# Patient Record
Sex: Female | Born: 1960
Health system: Southern US, Community
[De-identification: ages and names within clinical notes are randomized; demographics above are authoritative.]

## PROBLEM LIST (undated history)

## (undated) DIAGNOSIS — M7501 Adhesive capsulitis of right shoulder: Secondary | ICD-10-CM

## (undated) DIAGNOSIS — K869 Disease of pancreas, unspecified: Secondary | ICD-10-CM

## (undated) DIAGNOSIS — M109 Gout, unspecified: Secondary | ICD-10-CM

## (undated) DIAGNOSIS — T8859XA Other complications of anesthesia, initial encounter: Secondary | ICD-10-CM

## (undated) DIAGNOSIS — C642 Malignant neoplasm of left kidney, except renal pelvis: Secondary | ICD-10-CM

## (undated) DIAGNOSIS — S43431A Superior glenoid labrum lesion of right shoulder, initial encounter: Secondary | ICD-10-CM

## (undated) DIAGNOSIS — F32A Depression, unspecified: Secondary | ICD-10-CM

## (undated) DIAGNOSIS — F329 Major depressive disorder, single episode, unspecified: Secondary | ICD-10-CM

## (undated) DIAGNOSIS — G8929 Other chronic pain: Secondary | ICD-10-CM

## (undated) DIAGNOSIS — H04129 Dry eye syndrome of unspecified lacrimal gland: Secondary | ICD-10-CM

## (undated) DIAGNOSIS — R011 Cardiac murmur, unspecified: Secondary | ICD-10-CM

## (undated) DIAGNOSIS — G2581 Restless legs syndrome: Secondary | ICD-10-CM

## (undated) DIAGNOSIS — K219 Gastro-esophageal reflux disease without esophagitis: Secondary | ICD-10-CM

## (undated) DIAGNOSIS — F909 Attention-deficit hyperactivity disorder, unspecified type: Secondary | ICD-10-CM

## (undated) DIAGNOSIS — R0789 Other chest pain: Secondary | ICD-10-CM

## (undated) DIAGNOSIS — R911 Solitary pulmonary nodule: Secondary | ICD-10-CM

## (undated) DIAGNOSIS — I2510 Atherosclerotic heart disease of native coronary artery without angina pectoris: Secondary | ICD-10-CM

## (undated) DIAGNOSIS — S92253A Displaced fracture of navicular [scaphoid] of unspecified foot, initial encounter for closed fracture: Secondary | ICD-10-CM

## (undated) DIAGNOSIS — M549 Dorsalgia, unspecified: Secondary | ICD-10-CM

## (undated) DIAGNOSIS — I519 Heart disease, unspecified: Secondary | ICD-10-CM

## (undated) DIAGNOSIS — I700 Atherosclerosis of aorta: Secondary | ICD-10-CM

## (undated) DIAGNOSIS — N200 Calculus of kidney: Secondary | ICD-10-CM

## (undated) DIAGNOSIS — F419 Anxiety disorder, unspecified: Secondary | ICD-10-CM

## (undated) DIAGNOSIS — Z1211 Encounter for screening for malignant neoplasm of colon: Secondary | ICD-10-CM

## (undated) HISTORY — DX: Malignant neoplasm of left kidney, except renal pelvis: C64.2

## (undated) HISTORY — DX: Atherosclerosis of aorta: I70.0

## (undated) HISTORY — DX: Displaced fracture of navicular (scaphoid) of unspecified foot, initial encounter for closed fracture: S92.253A

## (undated) HISTORY — DX: Other chronic pain: G89.29

## (undated) HISTORY — DX: Disease of pancreas, unspecified: K86.9

## (undated) HISTORY — DX: Adhesive capsulitis of right shoulder: M75.01

## (undated) HISTORY — DX: Anxiety disorder, unspecified: F41.9

## (undated) HISTORY — DX: Dry eye syndrome of unspecified lacrimal gland: H04.129

## (undated) HISTORY — DX: Gout, unspecified: M10.9

## (undated) HISTORY — DX: Depression, unspecified: F32.A

## (undated) HISTORY — PX: WISDOM TOOTH EXTRACTION: SHX21

## (undated) HISTORY — DX: Other chest pain: R07.89

## (undated) HISTORY — DX: Attention-deficit hyperactivity disorder, unspecified type: F90.9

## (undated) HISTORY — DX: Solitary pulmonary nodule: R91.1

## (undated) HISTORY — DX: Dorsalgia, unspecified: M54.9

## (undated) HISTORY — DX: Encounter for screening for malignant neoplasm of colon: Z12.11

## (undated) HISTORY — DX: Major depressive disorder, single episode, unspecified: F32.9

## (undated) HISTORY — DX: Calculus of kidney: N20.0

## (undated) HISTORY — DX: Atherosclerotic heart disease of native coronary artery without angina pectoris: I25.10

## (undated) HISTORY — DX: Superior glenoid labrum lesion of right shoulder, initial encounter: S43.431A

## (undated) HISTORY — DX: Restless legs syndrome: G25.81

## (undated) HISTORY — PX: REDUCTION MAMMAPLASTY: SUR839

---
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
## 1997-01-16 HISTORY — PX: BREAST SURGERY: SHX581

## 1998-02-24 ENCOUNTER — Other Ambulatory Visit: Admission: RE | Admit: 1998-02-24 | Discharge: 1998-02-24 | Payer: Self-pay | Admitting: Obstetrics and Gynecology

## 1998-05-04 ENCOUNTER — Other Ambulatory Visit: Admission: RE | Admit: 1998-05-04 | Discharge: 1998-05-04 | Payer: Self-pay | Admitting: Plastic Surgery

## 1998-06-16 ENCOUNTER — Ambulatory Visit (HOSPITAL_COMMUNITY): Admission: RE | Admit: 1998-06-16 | Discharge: 1998-06-16 | Payer: Self-pay | Admitting: Neurosurgery

## 1998-06-16 ENCOUNTER — Encounter: Payer: Self-pay | Admitting: Neurosurgery

## 1999-07-27 ENCOUNTER — Other Ambulatory Visit: Admission: RE | Admit: 1999-07-27 | Discharge: 1999-07-27 | Payer: Self-pay | Admitting: Obstetrics and Gynecology

## 2000-01-17 HISTORY — PX: ENDOMETRIAL ABLATION: SHX621

## 2001-02-27 ENCOUNTER — Other Ambulatory Visit: Admission: RE | Admit: 2001-02-27 | Discharge: 2001-02-27 | Payer: Self-pay | Admitting: Obstetrics and Gynecology

## 2002-05-29 ENCOUNTER — Other Ambulatory Visit: Admission: RE | Admit: 2002-05-29 | Discharge: 2002-05-29 | Payer: Self-pay | Admitting: Obstetrics and Gynecology

## 2003-01-17 HISTORY — PX: KIDNEY STONE SURGERY: SHX686

## 2003-07-31 ENCOUNTER — Other Ambulatory Visit: Admission: RE | Admit: 2003-07-31 | Discharge: 2003-07-31 | Payer: Self-pay | Admitting: Obstetrics and Gynecology

## 2004-06-23 ENCOUNTER — Ambulatory Visit (HOSPITAL_COMMUNITY): Admission: AD | Admit: 2004-06-23 | Discharge: 2004-06-23 | Payer: Self-pay | Admitting: Urology

## 2004-10-17 ENCOUNTER — Other Ambulatory Visit: Admission: RE | Admit: 2004-10-17 | Discharge: 2004-10-17 | Payer: Self-pay | Admitting: Dermatology

## 2005-04-10 ENCOUNTER — Other Ambulatory Visit: Admission: RE | Admit: 2005-04-10 | Discharge: 2005-04-10 | Payer: Self-pay | Admitting: Obstetrics and Gynecology

## 2005-06-30 ENCOUNTER — Ambulatory Visit (HOSPITAL_COMMUNITY): Admission: RE | Admit: 2005-06-30 | Discharge: 2005-06-30 | Payer: Self-pay | Admitting: Obstetrics and Gynecology

## 2005-06-30 ENCOUNTER — Encounter (INDEPENDENT_AMBULATORY_CARE_PROVIDER_SITE_OTHER): Payer: Self-pay | Admitting: Specialist

## 2007-12-02 ENCOUNTER — Encounter (INDEPENDENT_AMBULATORY_CARE_PROVIDER_SITE_OTHER): Payer: Self-pay | Admitting: Obstetrics and Gynecology

## 2007-12-02 ENCOUNTER — Ambulatory Visit (HOSPITAL_COMMUNITY): Admission: RE | Admit: 2007-12-02 | Discharge: 2007-12-03 | Payer: Self-pay | Admitting: Obstetrics and Gynecology

## 2007-12-02 HISTORY — PX: LAPAROSCOPIC VAGINAL HYSTERECTOMY: SUR798

## 2008-06-10 ENCOUNTER — Encounter: Admission: RE | Admit: 2008-06-10 | Discharge: 2008-06-10 | Payer: Self-pay | Admitting: Family Medicine

## 2008-10-07 ENCOUNTER — Encounter: Admission: RE | Admit: 2008-10-07 | Discharge: 2008-10-07 | Payer: Self-pay | Admitting: Obstetrics and Gynecology

## 2010-02-04 ENCOUNTER — Encounter
Admission: RE | Admit: 2010-02-04 | Discharge: 2010-02-04 | Payer: Self-pay | Source: Home / Self Care | Attending: Obstetrics and Gynecology | Admitting: Obstetrics and Gynecology

## 2010-02-05 ENCOUNTER — Encounter: Payer: Self-pay | Admitting: Obstetrics and Gynecology

## 2010-02-06 ENCOUNTER — Encounter: Payer: Self-pay | Admitting: Family Medicine

## 2010-05-31 NOTE — Discharge Summary (Signed)
NAMEABBYGAIL, Jacqueline Vance                ACCOUNT NO.:  [id number]   MEDICAL RECORD NO.:  [id number]          PATIENT TYPE:  OIB   LOCATION:  9309                          FACILITY:  [facility]   PHYSICIAN:  Juluis Mire, M.D.   DATE OF BIRTH:  07/17/1960   DATE OF ADMISSION:  12/02/2007  DATE OF DISCHARGE:  12/03/2007                               DISCHARGE SUMMARY   ADMITTING DIAGNOSIS:  Uterine adenomyosis.   POSTOPERATIVE DIAGNOSIS:  Uterine adenomyosis.   OPERATIVE PROCEDURE:  Laparoscopic-assisted vaginal hysterectomy.   For complete history and physical, please see dictated note course in  the hospital.  The patient underwent laparoscopic-assisted vaginal  hysterectomy.  Did have some evidence of persistent endometriosis.  She  did well postoperatively, discharged home on the first postop day.  Postop hemoglobin was 11.  At the time of discharge, she was afebrile,  stable vital signs.  The abdomen was soft.  Bowel sounds were active.  All incisions were clear.  She was voiding without difficulty, having no  active vaginal bleeding.   In terms of complications, none were encountered in the hospital, the  patient was discharged home in stable condition.   DISPOSITION:  Routine postop instructions were given.  She is to avoid  heavy lifting, vaginal entrance, or driving a car.  She is to watch for  signs of infection, nausea, vomiting, increased abdominal pain, or  active vaginal bleeding.  Signs and symptoms of deep venous thrombosis  and pulmonary embolus also discussed.  She will follow up in the office  in 1 week.      Juluis Mire, M.D.  Electronically Signed     JSM/MEDQ  D:  12/03/2007  T:  12/03/2007  Job:  [id number]

## 2010-05-31 NOTE — Op Note (Signed)
NAMEKALKIDAN, CAUDELL                ACCOUNT NO.:  [id number]   MEDICAL RECORD NO.:  [id number]          PATIENT TYPE:  OIB   LOCATION:  9309                          [facility]   PHYSICIAN:  Juluis Mire, M.D.   DATE OF BIRTH:  1960/10/31   DATE OF PROCEDURE:  12/02/2007  DATE OF DISCHARGE:                               OPERATIVE REPORT   PREOPERATIVE DIAGNOSIS:  Adenomyosis.   POSTOPERATIVE DIAGNOSES:  1. Adenomyosis.  2. Endometriosis.   PROCEDURE:  Laparoscopic-assisted vaginal hysterectomy.   SURGEON:  Juluis Mire, MD   ASSISTANT:  Stann Mainland. Grewal, MD   ANESTHESIA:  General endotracheal.   ESTIMATED BLOOD LOSS:  Approximately 200-300 mL.   PACKS AND DRAINS:  None.   INTRAOPERATIVE BLOOD REPLACEMENT:  None.   COMPLICATIONS:  None.   INDICATIONS:  As noted in history and physical.   PROCEDURE:  The patient was taken to the OR and placed in supine  position.  After satisfactory level of general endotracheal anesthesia  was obtained, the patient was placed in the dorsal lithotomy position  using the Allen stirrups.  At this point in time, the abdomen, perineum,  and vagina were prepped out with Betadine.  Bladder was emptied by  catheterization.  A Hulka tenaculum was put in place and secured.  The  patient was then draped in a sterile field.  At this point in time, a  subumbilical incision was made with a knife.  The Veress needle was  introduced into the abdominal cavity.  Abdomen was inflated with  approximately 3 L of carbon dioxide.  The operating laparoscope was then  introduced.  There was no evidence of injury to adjacent organs.  A 5-mm  trocar was put in place in the suprapubic area under direct  visualization.  Uterus, tubes, and ovaries were unremarkable.  She did  have some right pelvic wall endometriosis.  Upper abdomen including  liver and tip of the gallbladder were clear.  Both the lateral gutters  were clear, I could not see the appendix,  maybe retrocecal.  At this  point in time, we went to the right adnexa using the EndoSeal, we  cauterized and ligated the right utero-ovarian pedicle along with the  right round ligament.  We then went to the left side using the EndoSeal,  we cauterized and incised the left utero-ovarian pedicles as well as the  left round ligament.  We had good freeing of the adnexa.  Decision was  to go vaginally.   The abdomen was deflated with carbon dioxide.  The laparoscope had been  taken out.  The patient's legs were repositioned.  The Hulka tenaculum  was then removed.  A weighted speculum was placed in the vaginal vault.  Cervix was grasped with Christella Hartigan tenaculum.  Cul-de-sac was entered  sharply.  Both uterosacral ligaments were clamped, cut, and suture  ligated with 0 Vicryl.  Reflection of the vaginal mucosa anteriorly was  incised, and bladder was dissected superiorly.  Paracervical tissue was  clamped, cut, and suture ligated with 0 Vicryl.  Vesicouterine space was  entered, and retractors were put in place to retract the bladder  superiorly using the clamp, cut, and tie technique with suture ligature  of 0 Vicryl.  The parametrium was serially separated from sides of  uterus.  The uterus was then flipped, remaining pedicles were clamped  and cut.  Uterus and cervix were passed off the operative field and sent  to Pathology.  Held pedicles secured with free ties of 0 Vicryl.  A  uterosacral plication stitch of 0 chromic was put in place and secured.  Next, the vaginal mucosa was reapproximated with interrupted sutures of  2-0 Monocryl using figure-of-eight sutures.  We had good approximation.  A Foley was placed to straight drain, and clear urine was obtained.   The patient's legs were repositioned.  Laparoscope was reintroduced.  Visualization did reveal some oozing from the vaginal cuff and brought  under control using cautery.  At the end of the procedure, both ovaries  were  hemostatically intact along with the vaginal cuff.  We thoroughly  irrigated, and removing irrigation, had good hemostasis.  The abdomen  was deflated with carbon dioxide.  All trocars removed.  The  subumbilical incision was closed with interrupted subcuticular suture of  4-0 Vicryl.  Suprapubic incision was closed with Dermabond.  The patient  was taken out of dorsal lithotomy position.  Once alert and extubated,  transferred to recovery room in good condition.  Sponge, instrument, and  needle count was correct by circulating nurse x2.      Juluis Mire, M.D.  Electronically Signed     JSM/MEDQ  D:  12/02/2007  T:  12/02/2007  Job:  [id number]

## 2010-05-31 NOTE — H&P (Signed)
Jacqueline Vance, Jacqueline Vance                ACCOUNT NO.:  [id number]   MEDICAL RECORD NO.:  [id number]          PATIENT TYPE:  AMB   LOCATION:  SDC                           [facility]ILITY:  [facility]   PHYSICIAN:  Juluis Mire, M.D.   DATE OF BIRTH:  1960-04-13   DATE OF ADMISSION:  DATE OF DISCHARGE:                              HISTORY & PHYSICAL   The patient is a 47-year gravida 1, para 1 female presents for  laparoscopic-assisted vaginal hysterectomy.   In relation to present admission, the patient had a previous  ThermaChoice ablation back in 2002, was doing well.  However, from the  past year she is having increasing pelvic pain and dysmenorrhea.  This  seems to be cyclic, we felt that probably related adenomyosis.  Ultrasound evaluations have revealed a questionable bicornate uterus.  No adnexal masses were seen.  Endometrial thickness was measured on the  largest 1-5.6 mm.  In view of this, the patient had discussed options  including hormonal treatment versus hysterectomy.  The patient now  presents for laparoscopic-assisted vaginal hysterectomy.   In terms of allergies.  She has no known drug allergies.   MEDICATIONS:  1. Celexa 20 mg a day.  2. Wellbutrin.  3. Imitrex as needed.   PAST MEDICAL HISTORY:  1. Usual childhood diseases, no significant sequelae.  2. She does have a history of migraine headaches being actively      managed.  3. Had a previous laser conization of cervix for dysplasia.  4. From surgical standpoint, she had a breast reduction in 1999.  5. She had kidney stone surgery in 2005.  6. She had endometrial ablation as noted above.   OBSTETRICAL HISTORY:  She had one vaginal delivery.   FAMILY HISTORY:  There is a history of diverticulosis, diabetes, and  arthritis.   SOCIAL HISTORY:  No tobacco or alcohol use.   REVIEW OF SYSTEMS:  Noncontributory.   PHYSICAL EXAMINATION:  VITAL SIGNS:  The patient is afebrile and stable  vital signs.  HEENT:  The patient  is normocephalic.  Pupils equal, round, reactive to  light, and accommodation.  Extraocular movements are intact.  Sclerae  and conjunctivae are clear.  Oropharynx clear.  NECK:  Without thyromegaly.  BREASTS:  No discrete masses.  LUNGS:  Clear.  CARDIOVASCULAR SYSTEM:  Regular rate without murmurs or gallops.  ABDOMEN:  Benign.  No mass, organomegaly, or tenderness.  PELVIC:  Normal external genitalia.  Vaginal mucosa is clear.  Cervix  unremarkable.  Uterus upper limits of normal size, mildly tender.  Adnexa unremarkable.  EXTREMITIES:  Trace edema.  NEUROLOGIC:  Grossly within normal limits.   IMPRESSION:  Recurrent pelvic pain, discomfort probably secondary to  adenomyosis.   PLAN:  After discussion of options, the patient to undergo laparoscopic-  assisted vaginal hysterectomy.  Risks of surgery have been discussed  including the risk of infection.  The risk of hemorrhage could require a  transfusion with the risk of AIDS or hepatitis.  The risk of injury to  adjacent organs including bladder, bowel, or ureters that could require  further  exploratory surgery.  The risk of deep venous thrombosis and  pulmonary embolus.  The patient expressed understanding of indications  and risks.      Juluis Mire, M.D.  Electronically Signed     JSM/MEDQ  D:  12/02/2007  T:  12/02/2007  Job:  [id number]

## 2010-06-03 NOTE — H&P (Signed)
Jacqueline Vance, Jacqueline Vance                ACCOUNT NO.:  [id number]   MEDICAL RECORD NO.:  [id number]          PATIENT TYPE:  AMB   LOCATION:  SDC                           [facility]   PHYSICIAN:  Juluis Mire, M.D.   DATE OF BIRTH:  October 03, 1960   DATE OF ADMISSION:  06/30/2005  DATE OF DISCHARGE:                                HISTORY & PHYSICAL   The patient is a 50 year old gravida 1, para 1, female who presents for  hysteroscopy and ThermaChoice ablation.   In relation to the present admission, she had been having trouble with  menorrhagia and prolonged cycles.  Cycles have been approximately 21 days of  flow.  She describes heavy flow, using 2 tampons and changing these every  hour, with clots and dysmenorrhea.  Her previous saline infusion was  unremarkable.  We thought she had a probable adenomyosis.  We have discussed  options including birth control pills versus Mirena IUD versus ablation  versus hysterectomy.  The patient now presents for ThermaChoice ablation.  Overall satisfaction with ablation runs between 80 and 90%, amenorrheic  rates of approximately 40% are quoted.   ALLERGIES:  She has no known drug allergies.   MEDICATIONS:  She is on Topamax and Celexa.   PAST MEDICAL HISTORY:  The usual childhood diseases, no significant  sequelae.  She has had no previous surgical history, has had a laser  conization of the cervix for dysplasia.  She has had 1 vaginal delivery.   FAMILY HISTORY:  Noncontributory.   SOCIAL HISTORY:  No tobacco or alcohol use.   REVIEW OF SYSTEMS:  Noncontributory.   PHYSICAL EXAMINATION:  VITAL SIGNS:  The patient is afebrile with stable  vital signs.  HEENT:  Patient normocephalic.  Pupils equal, round and reactive to light  and accommodation.  Extraocular movements are intact.  Sclerae and  conjunctivae are clear.  NECK:  Without thyromegaly.  BREASTS:  No discrete masses.  LUNGS:  Clear.  CARDIAC:  Regular rhythm and rate without  murmurs or gallops.  ABDOMEN:  Benign.  No mass, organomegaly or tenderness.  PELVIC:  Normal external genitalia.  Vaginal mucosa clear.  Cervix  unremarkable.  Uterus upper limits of normal size.  Adnexa unremarkable.  EXTREMITIES:  Trace edema.  NEUROLOGIC:  Grossly within normal limits.   IMPRESSION:  Menorrhagia, probably secondary to adenomyosis.   PLAN:  The patient will undergo a hysteroscopy, D&C and ThermaChoice  ablation.  The risks of the procedure have been discussed, including the  risk of infection; the risk of hemorrhage that could require transfusion or  possible  hysterectomy; the risk of injury to adjacent organs through perforation that  could require further exploratory surgery; the risk of deep venous  thrombosis and pulmonary embolus.  The patient expressed an understanding of  indications and risks.      Juluis Mire, M.D.  Electronically Signed     JSM/MEDQ  D:  06/30/2005  T:  06/30/2005  Job:  [id number]

## 2010-06-03 NOTE — Op Note (Signed)
NAMECAMELLIA, Jacqueline Vance                ACCOUNT NO.:  [id number]   MEDICAL RECORD NO.:  [id number]          PATIENT TYPE:  AMB   LOCATION:  SDC                           [facility]   PHYSICIAN:  Juluis Mire, M.D.   DATE OF BIRTH:  18-Mar-1960   DATE OF PROCEDURE:  06/30/2005  DATE OF DISCHARGE:                                 OPERATIVE REPORT   PREOPERATIVE DIAGNOSIS:  Menorrhagia secondary to adenomyosis.   POSTOPERATIVE DIAGNOSIS:  Menorrhagia secondary to adenomyosis.   OPERATIVE PROCEDURE:  1.  Cervical dilatation.  2.  Hysteroscopy with endometrial curettings.  3.  ThermaChoice ablation.   SURGEON:  Juluis Mire, M.D.   ANESTHESIA:  General.   ESTIMATED BLOOD LOSS:  Minimal.   PACK AND DRAINS:  None.   INTRAOPERATIVE BLOOD REPLACEMENT:  None.   COMPLICATIONS:  None.   INDICATIONS:  Indications are in dictated history and physical.   DESCRIPTION OF PROCEDURE:  The patient was taken to the OR and placed in a  supine position.  After a satisfactory level of general anesthesia was  obtained, the patient was placed in the dorsal lithotomy position using the  Allen stirrups.  The perineum and vagina were prepped out with Betadine and  draped into a sterile field.  A speculum was placed in the vaginal vault.  Cervix was grasped with a single-tooth tenaculum and a paracervical block  was instituted using 1% Nesacaine.  The uterus sounded to 8 cm.  We dilated  the cervix to a 26 Pratt dilator.  A nonoperative hysteroscope was  introduced and the intrauterine cavity was distended with D-5-W.  Visualization revealed normal endometrial contour, no polyps or other  abnormalities.  We did do endometrial curettings and these were sent for  pathological review.  Next, the ThermaChoice was appropriately primed and  inserted into the uterine cavity.  We began distending using D-5-W.  We  reached and stabilized with a pressure of approximately 190.  The cycle was  then begun and  completed within 8 minutes.  Pressure continued to be within  the normal range.  At this point, the cooling cycle instilled.  Once  completed, we deflated the balloon and removed the instrument intact.  Repeat hysteroscopy revealed excellent blanching of the endometrium and no  signs of perforation or other  complications.  The single-tooth tenaculum and speculum were then removed.  The patient was taken out of the dorsal lithotomy position and once she was  alert and extubated, transferred to the recovery room in good condition.  Sponge, instrument and needle count were reported as correct by the  circulating nurse.      Juluis Mire, M.D.  Electronically Signed     JSM/MEDQ  D:  06/30/2005  T:  06/30/2005  Job:  [id number]

## 2010-08-10 ENCOUNTER — Ambulatory Visit (INDEPENDENT_AMBULATORY_CARE_PROVIDER_SITE_OTHER): Payer: 59 | Admitting: Family Medicine

## 2010-08-10 ENCOUNTER — Encounter: Payer: Self-pay | Admitting: Family Medicine

## 2010-08-10 ENCOUNTER — Encounter: Payer: Self-pay | Admitting: *Deleted

## 2010-08-10 ENCOUNTER — Telehealth: Payer: Self-pay | Admitting: Family Medicine

## 2010-08-10 DIAGNOSIS — Z23 Encounter for immunization: Secondary | ICD-10-CM

## 2010-08-10 DIAGNOSIS — Z0000 Encounter for general adult medical examination without abnormal findings: Secondary | ICD-10-CM

## 2010-08-10 DIAGNOSIS — G8929 Other chronic pain: Secondary | ICD-10-CM

## 2010-08-10 DIAGNOSIS — M549 Dorsalgia, unspecified: Secondary | ICD-10-CM

## 2010-08-10 DIAGNOSIS — F329 Major depressive disorder, single episode, unspecified: Secondary | ICD-10-CM | POA: Insufficient documentation

## 2010-08-10 DIAGNOSIS — Z8669 Personal history of other diseases of the nervous system and sense organs: Secondary | ICD-10-CM | POA: Insufficient documentation

## 2010-08-10 MED ORDER — ALPRAZOLAM 0.5 MG PO TABS: ORAL_TABLET | ORAL | Status: DC

## 2010-08-10 MED ORDER — TOPIRAMATE 100 MG PO TABS: 100.0000 mg | ORAL_TABLET | Freq: Every day | ORAL | Status: DC

## 2010-08-10 MED ORDER — HEPATITIS B VAC RECOMBINANT 10 MCG/0.5ML IJ SUSP: 0.5000 mL | Freq: Once | INTRAMUSCULAR | Status: DC

## 2010-08-10 MED ORDER — TOPIRAMATE 25 MG PO TABS: 50.0000 mg | ORAL_TABLET | Freq: Every day | ORAL | Status: DC

## 2010-08-10 MED ORDER — ELETRIPTAN HYDROBROMIDE 20 MG PO TABS: ORAL_TABLET | ORAL | Status: DC

## 2010-08-10 MED ORDER — HYDROCODONE-ACETAMINOPHEN 7.5-325 MG PO TABS: 1.0000 | ORAL_TABLET | Freq: Four times a day (QID) | ORAL | Status: DC | PRN

## 2010-08-10 MED ORDER — CITALOPRAM HYDROBROMIDE 40 MG PO TABS: 40.0000 mg | ORAL_TABLET | Freq: Every day | ORAL | Status: DC

## 2010-08-10 MED ORDER — TETANUS-DIPHTH-ACELL PERTUSSIS 5-2.5-18.5 LF-MCG/0.5 IM SUSP: 0.5000 mL | Freq: Once | INTRAMUSCULAR | Status: DC

## 2010-08-10 MED ORDER — BUPROPION HCL ER (XL) 150 MG PO TB24: 150.0000 mg | ORAL_TABLET | Freq: Every day | ORAL | Status: DC

## 2010-08-10 NOTE — Telephone Encounter (Signed)
Please request records from [facility] on [address] ([place] ?) and from Dr. Lorelei Pont in [place], [place].  Thx--PM

## 2010-08-10 NOTE — Assessment & Plan Note (Signed)
Problem stable.  Continue current medications and diet appropriate for this condition.  We have reviewed our general long term plan for this problem and also reviewed symptoms and signs that should prompt the patient to call or return to the office.  

## 2010-08-10 NOTE — Assessment & Plan Note (Signed)
TdaP given today. She has never received Hep B vaccine, so we started this vaccine series today. Return in 80mo for Hep B #2.

## 2010-08-10 NOTE — Assessment & Plan Note (Signed)
DDD and scoliosis. At this point we'll continue norco qd -bid, new rx given. She'll continue prn chiropracter visits as long as she continues to get benefit. Will obtain old records/imaging.  Avoid chronic NSAID use.

## 2010-08-10 NOTE — Progress Notes (Signed)
Office Note 08/10/2010  CC:  Chief Complaint  Patient presents with  . Medication Refill    HPI:  Jacqueline Vance is a 50 y.o. White female who is here to establish care.  I followed her when I practiced at [facility] until 10/2009. Patient's most recent primary MD: Dr. Lorelei Pont (from about 11/201 up to now). Old records were not reviewed prior to or during today's visit.  Main problem has been depression and GAD, both of which have responded well to wellbutrin and celexa combo. She got a new job recently and is much happier.  Takes xanax infrequently--a few per month, needs rf.  Also has had prob w/migraines.  Says imitrex didn't work well plus it caused bad CP and arm pain.  She tried a friend's relpax and it aborted her HA completely and quickly, without side effect. Takes topamax 150mg  total daily dose for prophylaxis.  Says that if she goes off of this med her HA's return/worsen.  At this point she still has approx 7 HA's per month.  Has chronic neck and back pain: nagging, constant but worse with prolonged sitting, improved short term in the past with chiropractic manipulation.  Pt reports x-rays in the past showing loss of normal curvature of c-spine, lumbar scoliosis, as well as DDD in C and L spine. Has never done PT, spinal injection, or spinal surgery.  Takes NSAIDs rarely (800mg  ibuprofen), and usually takes 1-2 of her norco daily.  No side effects.  Recently established care with [facility] on [address]---saw the MD there and was rx'd phentermine but did not take it b/c it made her irritable.  She says she is doing well with the diet/lifestyle change portion of their treatment plan and has already lost 10 lbs (1200 cal/day diet).  She says she also had routine blood testing done at that office recently and all was fine.  Has a GYN: had hysterectomy for DUB 2009.  Last mammo 11/2009--normal.   Past Medical History  Diagnosis Date   . Migraine     Proph tx with topamax helps, abortive tx with relpax helps  . Kidney stones 2005  . Chronic back pain     neck, too  . Anxiety   . Depression     Past Surgical History  Procedure Date  . Laparoscopic vaginal hysterectomy 12/02/07    for DUB  . Breast surgery 1999    reduction  . Kidney stone surgery 2005  . Endometrial ablation 2002    No family history on file.  History   Social History  . Marital Status: Married    Spouse Name: N/A    Number of Children: N/A  . Years of Education: N/A   Occupational History  . Not on file.   Social History Main Topics  . Smoking status: Never Smoker   . Smokeless tobacco: Never Used  . Alcohol Use: No  . Drug Use: Not on file  . Sexually Active: Not on file   Other Topics Concern  . Not on file   Social History Narrative   Married, one adult son.  Lives in [place], [place].Works for [employer] in [employer] in front of a computer all day.Exercise: walks daily at lunch.No T/A/Ds.    Outpatient Encounter Prescriptions as of 08/10/2010  Medication Sig Dispense Refill  . ALPRAZolam (XANAX) 0.5 MG tablet 1-2 tabs po q8h prn  60 tablet  1  . buPROPion (WELLBUTRIN XL) 150 MG  24 hr tablet Take 1 tablet (150 mg total) by mouth daily.  30 tablet  6  . citalopram (CELEXA) 40 MG tablet Take 1 tablet (40 mg total) by mouth daily.  30 tablet  6  . eletriptan (RELPAX) 20 MG tablet One tablet by mouth as needed for migraine headache.  If the headache improves and then returns, dose may be repeated after 2 hours have elapsed since first dose (do not exceed 80 mg per day). May repeat in 2 hrs as needed.  30 tablet  3  . HYDROcodone-acetaminophen (NORCO) 7.5-325 MG per tablet Take 1 tablet by mouth every 6 (six) hours as needed for pain.  60 tablet  3  . topiramate (TOPAMAX) 100 MG tablet Take 1 tablet (100 mg total) by mouth daily.  30 tablet  6  . topiramate (TOPAMAX) 25 MG tablet Take 2 tablets (50 mg total) by mouth daily.  60  tablet  6   Facility-Administered Encounter Medications as of 08/10/2010  Medication Dose Route Frequency Provider Last Rate Last Dose  . TDaP (BOOSTRIX) injection 0.5 mL  0.5 mL Intramuscular Once Maryjean Morn Loys Hoselton      . DISCONTD: hepatitis b vaccine recombinant pediatric (ENGERIX-B) injection 0.5 mL  0.5 mL Intramuscular Once Jerald Hennington H Timoteo Carreiro        No Known Allergies  ROS Review of Systems  Musculoskeletal: Positive for back pain (chronic nagging right lumbar musculoskeletal pain).  No fatigue, no fevers, no joint pain or swelling, no vision or hearing complaints, no rash or abnl bruising, no abd pain, no n/v/d, no focal weakness, no dysuria or incontinence. No memory loss or excessive irritability or mood swings.   PE; Blood pressure 108/74, pulse 73, height 5' 3.5" (1.613 m), weight 169 lb (76.658 kg), SpO2 98.00%. Gen: Alert, well appearing.  Patient is oriented to person, place, time, and situation. HEENT: Scalp without lesions or hair loss.  Ears: EACs clear, normal epithelium.  TMs with good light reflex and landmarks bilaterally.  Eyes: no injection, icteris, swelling, or exudate.  EOMI, PERRLA. Nose: no drainage or turbinate edema/swelling.  No injection or focal lesion.  Mouth: lips without lesion/swelling.  Oral mucosa pink and moist.  Dentition intact and without obvious caries or gingival swelling.  Oropharynx without erythema, exudate, or swelling.  Neck: supple, ROM full.  Carotids 2+ bilat, without bruit.  No lymphadenopathy, thyromegaly, or mass. Chest: symmetric expansion, nonlabored respirations.  Clear and equal breath sounds in all lung fields.   CV: RRR, no m/r/g.  Peripheral pulses 2+ and symmetric. EXT: no clubbing, cyanosis, or edema.  BACK: mild TTP right lumbar paraspinous soft tissues.  ROM fully intact.  DTRs 2+ bilat in patellar, achilles, biceps, and triceps areas. Strength 5/5 bilat in prox and dist mm's of UEs and LEs.  Pertinent labs:   none  ASSESSMENT AND PLAN:   Chronic back pain DDD and scoliosis. At this point we'll continue norco qd -bid, new rx given. She'll continue prn chiropracter visits as long as she continues to get benefit. Will obtain old records/imaging.  Avoid chronic NSAID use.  Depression Problem stable.  Continue current medications and diet appropriate for this condition.  We have reviewed our general long term plan for this problem and also reviewed symptoms and signs that should prompt the patient to call or return to the office.   History of migraine headaches Problem stable.  Continue current medications and diet appropriate for this condition.  We have reviewed our general long  term plan for this problem and also reviewed symptoms and signs that should prompt the patient to call or return to the office.   Preventative health care TdaP given today. She has never received Hep B vaccine, so we started this vaccine series today. Return in 67mo for Hep B #2.   Overweight: continue 1200 cal/day diet, increase exercise.  [facility] We'll obtain all old records for review.  Return for F/u 6 mo for o/v to recheck anxiety, back pain, migraines.  F/u in 2 mo for nurse visit for hep B #2.

## 2010-08-12 ENCOUNTER — Telehealth: Payer: Self-pay | Admitting: Family Medicine

## 2010-08-12 ENCOUNTER — Encounter: Payer: Self-pay | Admitting: Family Medicine

## 2010-08-12 NOTE — Telephone Encounter (Signed)
Faxed request today 

## 2010-08-12 NOTE — Telephone Encounter (Signed)
Noted.  Med changed in medlist to reflect this.

## 2010-08-12 NOTE — Telephone Encounter (Signed)
Patient is requesting her next refill of eletriptan to be 40 mg since it took 4 hours for the 20 mg to work

## 2010-08-15 ENCOUNTER — Encounter: Payer: Self-pay | Admitting: Family Medicine

## 2010-08-25 ENCOUNTER — Telehealth: Payer: Self-pay | Admitting: Family Medicine

## 2010-08-25 NOTE — Telephone Encounter (Signed)
Advised pt Dr. Milinda Cave out of the office, although she would need OV for eval before drops would be called in.  Pt states she has been using some old drops that are out of date.  Advised pt using these drops can potentially cause infection and more harm than good.  She will stop using these.  Advised pt pink eye usually viral and drops will not help.  OK to use OTC drops, warm wet cloth for comfort and she will just have to let it run it's course.  Pt is agreeable and will begin using her OTC drops that are not out of date.

## 2010-08-25 NOTE — Telephone Encounter (Signed)
Patient has pink eye and she is leaving for the beach in the morning, can an Rx for Tobradex be sent in to the pharmacy?

## 2010-10-04 ENCOUNTER — Ambulatory Visit: Payer: 59

## 2010-10-04 ENCOUNTER — Other Ambulatory Visit: Payer: Self-pay | Admitting: *Deleted

## 2010-10-04 MED ORDER — ELETRIPTAN HYDROBROMIDE 40 MG PO TABS: 40.0000 mg | ORAL_TABLET | ORAL | Status: DC | PRN

## 2010-10-04 MED ORDER — PANTOPRAZOLE SODIUM 40 MG PO TBEC: 40.0000 mg | DELAYED_RELEASE_TABLET | Freq: Every day | ORAL | Status: DC

## 2010-10-04 NOTE — Telephone Encounter (Signed)
RFs done.  Please notify her of need to reschedule her second Hep B vaccine--PM

## 2010-10-04 NOTE — Telephone Encounter (Signed)
Faxed request recevied from pharmacy for refill on Relpax and pantoprazole.  Pt last seen 08/10/10 and should follow up in 01/2011.  Pt had nurse appt today, but was a no show.

## 2010-10-04 NOTE — Telephone Encounter (Signed)
RX faxed

## 2010-10-05 NOTE — Telephone Encounter (Signed)
Message left per DPR that refills were sent on 10/04/10 and to call back to reschedule nurse visit for Hep B vaccine.

## 2010-10-18 LAB — CBC
HCT: 32 — ABNORMAL LOW
HCT: 39.8
Hemoglobin: 11 — ABNORMAL LOW
Hemoglobin: 13.5
MCHC: 33.9
MCHC: 34.5
MCV: 91.8
MCV: 92.3
Platelets: 200
Platelets: 262
RBC: 3.46 — ABNORMAL LOW
RBC: 4.34
RDW: 13
RDW: 13.1
WBC: 6.4
WBC: 9.3

## 2010-10-18 LAB — GLUCOSE, CAPILLARY: Glucose-Capillary: 104 — ABNORMAL HIGH

## 2010-10-18 LAB — HCG, SERUM, QUALITATIVE: Preg, Serum: NEGATIVE

## 2010-10-20 ENCOUNTER — Ambulatory Visit: Payer: 59 | Admitting: Family Medicine

## 2010-11-25 ENCOUNTER — Telehealth: Payer: Self-pay | Admitting: *Deleted

## 2010-11-25 DIAGNOSIS — Z8669 Personal history of other diseases of the nervous system and sense organs: Secondary | ICD-10-CM

## 2010-11-25 NOTE — Telephone Encounter (Signed)
Pt would like to have referral to neuro in [place] that was recommended by friend.  She has been going to [facility] for 12 years without good results.  Practice is [facility]. Dr. Oleh Genin.  Phone number [phone].  Pt has called this practice and they need referral from PCP.   Pt will get flu shot at work, advised pt she is due for 2nd Hep B vaccine.  She states she will call back to schedule.

## 2010-11-25 NOTE — Telephone Encounter (Signed)
Ok.  Referral ordered as requested.

## 2011-01-26 ENCOUNTER — Telehealth: Payer: Self-pay | Admitting: Family Medicine

## 2011-01-26 ENCOUNTER — Encounter: Payer: Self-pay | Admitting: Family Medicine

## 2011-01-26 DIAGNOSIS — E663 Overweight: Secondary | ICD-10-CM | POA: Insufficient documentation

## 2011-01-26 NOTE — Telephone Encounter (Signed)
Written rx done and placed on your computer keyboard.  thx

## 2011-01-26 NOTE — Telephone Encounter (Signed)
Pt wants RX for Vitalus weight loss system.  She would like to try this method to lose weight. Pt would also like RX for "[employer]" home exercise equipment.  Information states that these items may be eligible if used for medical condition (obesity).  Pt has scoliosis and is 30-40 pounds overwieght and would like to lose this weight this year.  Please advise if appropriate.

## 2011-01-26 NOTE — Telephone Encounter (Signed)
Patient has a new benefit on her insurance, if she gets an Rx for exercise equipment and weight loss she can put it on her flex spending account. Patient says that her friends have had success with Vitalus and she would like to try it since it would be a good fit with her arthritis & scoliosis issues

## 2011-01-27 NOTE — Telephone Encounter (Signed)
Patient picked it up today

## 2011-02-03 ENCOUNTER — Ambulatory Visit: Payer: 59 | Admitting: Family Medicine

## 2011-03-07 ENCOUNTER — Other Ambulatory Visit: Payer: Self-pay | Admitting: Family Medicine

## 2011-03-07 MED ORDER — ALPRAZOLAM 0.5 MG PO TABS: ORAL_TABLET | ORAL | Status: DC

## 2011-03-07 MED ORDER — HYDROCODONE-ACETAMINOPHEN 7.5-325 MG PO TABS: 1.0000 | ORAL_TABLET | Freq: Four times a day (QID) | ORAL | Status: DC | PRN

## 2011-03-07 NOTE — Telephone Encounter (Signed)
Last seen 08/10/10, follow up in 6 months.  Pt is past due for follow up. Norco 7.5mg /371mcg last filled 12/09/10 #60 x 3 and Xanaz 5mg  last filled 12/03/10 #60 x 1

## 2011-03-07 NOTE — Telephone Encounter (Signed)
Printed 1 mo supply of each rx, no additional RFs.  Needs to come in for routine med monitoring f/u o/v prior to any FURTHER RFs.  thx

## 2011-03-08 NOTE — Telephone Encounter (Signed)
RX faxed.  Pt notified.  Appt scheduled for 03/21/11.

## 2011-03-21 ENCOUNTER — Ambulatory Visit: Payer: 59 | Admitting: Family Medicine

## 2011-05-25 ENCOUNTER — Other Ambulatory Visit: Payer: Self-pay | Admitting: *Deleted

## 2011-05-25 MED ORDER — ELETRIPTAN HYDROBROMIDE 40 MG PO TABS: ORAL_TABLET | ORAL | Status: DC

## 2011-05-25 NOTE — Telephone Encounter (Signed)
Faxed refill request received from pharmacy for Relpax Last filled by MD on 10/04/10 Last seen on 07/21/10 Follow up in 6 months. RX sent for one month.

## 2011-05-26 ENCOUNTER — Ambulatory Visit (INDEPENDENT_AMBULATORY_CARE_PROVIDER_SITE_OTHER): Payer: 59 | Admitting: Family Medicine

## 2011-05-26 ENCOUNTER — Encounter: Payer: Self-pay | Admitting: Family Medicine

## 2011-05-26 VITALS — BP 112/75 | HR 69 | Temp 98.6°F | Ht 63.5 in | Wt 173.0 lb

## 2011-05-26 DIAGNOSIS — B9789 Other viral agents as the cause of diseases classified elsewhere: Secondary | ICD-10-CM

## 2011-05-26 DIAGNOSIS — Z23 Encounter for immunization: Secondary | ICD-10-CM

## 2011-05-26 DIAGNOSIS — E559 Vitamin D deficiency, unspecified: Secondary | ICD-10-CM | POA: Insufficient documentation

## 2011-05-26 DIAGNOSIS — E538 Deficiency of other specified B group vitamins: Secondary | ICD-10-CM

## 2011-05-26 DIAGNOSIS — R5381 Other malaise: Secondary | ICD-10-CM

## 2011-05-26 DIAGNOSIS — Z0000 Encounter for general adult medical examination without abnormal findings: Secondary | ICD-10-CM

## 2011-05-26 DIAGNOSIS — B349 Viral infection, unspecified: Secondary | ICD-10-CM

## 2011-05-26 DIAGNOSIS — I889 Nonspecific lymphadenitis, unspecified: Secondary | ICD-10-CM

## 2011-05-26 DIAGNOSIS — Z8669 Personal history of other diseases of the nervous system and sense organs: Secondary | ICD-10-CM

## 2011-05-26 DIAGNOSIS — R5383 Other fatigue: Secondary | ICD-10-CM | POA: Insufficient documentation

## 2011-05-26 MED ORDER — CEPHALEXIN 500 MG PO CAPS: 500.0000 mg | ORAL_CAPSULE | Freq: Three times a day (TID) | ORAL | Status: AC

## 2011-05-26 MED ORDER — POLYMYXIN B-TRIMETHOPRIM 10000-0.1 UNIT/ML-% OP SOLN: OPHTHALMIC | Status: DC

## 2011-05-26 NOTE — Assessment & Plan Note (Signed)
Hep B #2 given today.  Return after 8 wks to get #3. Reminded pt that she is due for her annual mammogram and she said she did get a reminder in the mail recently and she plans on making this appt soon. She also plans on routine f/u with her GYN MD soon.

## 2011-05-26 NOTE — Assessment & Plan Note (Signed)
Responded well to one botox shot 2 mo ago, will possibly be weening off of topamax in near future.  She gets these managed by [facility] specialist.

## 2011-05-26 NOTE — Assessment & Plan Note (Signed)
Acute on chronic: recheck vit B12 and vit D, also check CBC, CMET, and TSH.

## 2011-05-26 NOTE — Progress Notes (Signed)
OFFICE NOTE  05/26/2011  CC:  Chief Complaint  Patient presents with  . Sore Throat    ST and headache x 2-3 weeks     HPI: Patient is a 51 y.o. Caucasian female who is here for sore throat. Pt presents complaining of respiratory symptoms for 2 wks.  Primary symptoms are: ST, ear pains, cough that is dry.  Left eye gets gummy and red, closed shut in mornings.  No fever, no body aches.  Feels very sleepy-tired, some body fatigue as well.  Sounds like this has been more or less chronic fatigue, but worse with recent illness.  Worst symptoms seems to be the ST.  Lately the symptoms seem to be worsening.   Pertinent negatives: No fevers, no wheezing, and no SOB.  No pain in face or teeth.   Symptoms made worse by :evenings.  Symptoms improved by nothing. Smoker? no Recent sick contact? no Muscle or joint aches? no   Also, she reports past dx of vit D and vit B12 deficiency (by a previous PMD that she saw only once), was on vit D pill daily (rx ) until 46mo ago when she simply forgot to continue getting RFs.  She took one shot of vit B12, then was put on vit B12 pills briefly and she stopped it--neither of these labs have been rechecked per pt.  Additional ROS: no n/v/d or abdominal pain.  No rash.  No neck stiffness.      Pertinent PMH:  Past Medical History  Diagnosis Date  . Migraine     Proph tx with topamax helps, abortive tx with relpax helps  . Kidney stones 2005  . Chronic back pain     neck, too  . Anxiety   . Depression    Past surgical, social, and family history reviewed and no changes noted since last office visit.  MEDS:  Outpatient Prescriptions Prior to Visit  Medication Sig Dispense Refill  . buPROPion (WELLBUTRIN XL) 150 MG 24 hr tablet Take 1 tablet (150 mg total) by mouth daily.  30 tablet  6  . citalopram (CELEXA) 40 MG tablet Take 1 tablet (40 mg total) by mouth daily.  30 tablet  6  . eletriptan (RELPAX) 40 MG tablet One tablet by mouth at onset of  headache. May repeat in 2 hours if headache persists or recurs. Must have appointment for more refills.  10 tablet  0  . pantoprazole (PROTONIX) 40 MG tablet Take 1 tablet (40 mg total) by mouth daily.  30 tablet  10  . topiramate (TOPAMAX) 100 MG tablet Take 1 tablet (100 mg total) by mouth daily.  30 tablet  6  . human chorionic gonadotropin (PREGNYL/NOVAREL) 10000 UNITS injection Inject 10,000 Units into the muscle 2 (two) times a week. Rx'd by MD at [facility] starting 07/28/10.       Marland Kitchen phentermine 37.5 MG capsule Take 37.5 mg by mouth every morning. Rx'd by MD at [facility] 07/28/10.       Marland Kitchen topiramate (TOPAMAX) 25 MG tablet Take 2 tablets (50 mg total) by mouth daily.  60 tablet  6    PE: Blood pressure 112/75, pulse 69, temperature 98.6 F (37 C), temperature source Temporal, height 5' 3.5" (1.613 m), weight 173 lb (78.472 kg). Gen: Alert, well appearing.  Patient is oriented to person, place, time, and situation. ENT: Ears: EACs clear, normal epithelium.  TMs with good light reflex and landmarks bilaterally.  Eyes: no injection, icteris, swelling,  or exudate.  EOMI, PERRLA. Nose: no drainage or turbinate edema/swelling.  No injection or focal lesion.  Mouth: lips without lesion/swelling.  Oral mucosa pink and moist.  Dentition intact and without obvious caries or gingival swelling.  Oropharynx without erythema, exudate, or swelling.  Neck: submandibular LAD palpable bilat, left side TTP.  No overlying erythema.  These nodes are not fixed or indurated-feeling. CV: RRR, no m/r/g.   LUNGS: CTA bilat, nonlabored resps, good aeration in all lung fields. EXT: no clubbing, cyanosis, or edema.    IMPRESSION AND PLAN:  Viral syndrome Self-limited nature of this illness was discussed, questions answered.  Discussed symptomatic care, rest, fluids.   Warning signs/symptoms of worsening illness were discussed.  Patient instructed to call or return if any of these  occur. Encouraged her to try ibuprofen or tylenol for pain, then resort to vicodin (which she has at home for prn use for back pain) if these are not helpful enough.  Lymphadenitis Keflex 500mg  tid x 10d.  Fatigue Acute on chronic: recheck vit B12 and vit D, also check CBC, CMET, and TSH.  History of migraine headaches Responded well to one botox shot 2 mo ago, will possibly be weening off of topamax in near future.  She gets these managed by [facility] specialist.  Preventative health care Hep B #2 given today.  Return after 8 wks to get #3. Reminded pt that she is due for her annual mammogram and she said she did get a reminder in the mail recently and she plans on making this appt soon. She also plans on routine f/u with her GYN MD soon.      FOLLOW UP:  8 wks for lab nurse visit (hep b vaccine #3)    ]

## 2011-05-26 NOTE — Assessment & Plan Note (Signed)
Keflex 500mg  tid x 10d.

## 2011-05-26 NOTE — Assessment & Plan Note (Signed)
Self-limited nature of this illness was discussed, questions answered.  Discussed symptomatic care, rest, fluids.   Warning signs/symptoms of worsening illness were discussed.  Patient instructed to call or return if any of these occur. Encouraged her to try ibuprofen or tylenol for pain, then resort to vicodin (which she has at home for prn use for back pain) if these are not helpful enough.

## 2011-06-07 ENCOUNTER — Telehealth: Payer: Self-pay | Admitting: Family Medicine

## 2011-06-07 MED ORDER — FLUCONAZOLE 150 MG PO TABS: 150.0000 mg | ORAL_TABLET | Freq: Once | ORAL | Status: AC

## 2011-06-07 NOTE — Telephone Encounter (Signed)
Patient feels like she has a yeast infection from her recent med. Can an Rx be called in for a yeast infection?

## 2011-06-07 NOTE — Telephone Encounter (Signed)
Please notify pt that I'll call diflucan in to her pharmacy.  --thx

## 2011-06-07 NOTE — Telephone Encounter (Signed)
Given keflex on 5/10.  Please advise if OK.

## 2011-06-08 NOTE — Telephone Encounter (Signed)
Pt notified on 06/07/11 that RX will be sent.

## 2011-06-23 ENCOUNTER — Other Ambulatory Visit: Payer: Self-pay

## 2011-06-23 ENCOUNTER — Other Ambulatory Visit: Payer: Self-pay | Admitting: Family Medicine

## 2011-06-23 MED ORDER — ELETRIPTAN HYDROBROMIDE 40 MG PO TABS: ORAL_TABLET | ORAL | Status: DC

## 2011-06-23 MED ORDER — BUPROPION HCL ER (XL) 150 MG PO TB24: 150.0000 mg | ORAL_TABLET | Freq: Every day | ORAL | Status: DC

## 2011-06-23 MED ORDER — HYDROCODONE-ACETAMINOPHEN 7.5-325 MG PO TABS: 1.0000 | ORAL_TABLET | Freq: Four times a day (QID) | ORAL | Status: DC | PRN

## 2011-06-23 NOTE — Telephone Encounter (Signed)
RX faxed to pharmacy.

## 2011-06-23 NOTE — Telephone Encounter (Signed)
Hydrocodone/APAP 7.5/325, #60, no RF rx printed today.--PM

## 2011-06-23 NOTE — Telephone Encounter (Signed)
Last refill on Relpax 05-25-11 quantity 10 with 0 refills. Hydrocodone last RX wrote on 03-17-11 quantity 60 with 0 refills. i will send Bupropion

## 2011-08-24 ENCOUNTER — Other Ambulatory Visit: Payer: Self-pay | Admitting: *Deleted

## 2011-08-24 MED ORDER — HYDROCODONE-ACETAMINOPHEN 7.5-325 MG PO TABS: 1.0000 | ORAL_TABLET | Freq: Four times a day (QID) | ORAL | Status: AC | PRN

## 2011-08-24 MED ORDER — ALPRAZOLAM 0.5 MG PO TABS: ORAL_TABLET | ORAL | Status: AC

## 2011-08-24 NOTE — Telephone Encounter (Signed)
Faxed refill request received from pharmacy for ALPRAZOLAM Last filled by MD on 03/07/11, #60 X 0  Faxed refill request received from pharmacy for HYDROCODONE 7.5-325 Last filled by MD on 06/23/11 Last seen on 05/26/11 Follow up not noted for chronic problems.  Pt due for hep B #3

## 2011-08-24 NOTE — Telephone Encounter (Signed)
RF's done.  Please have patient make appt for annual health maintenance exam sometime in the next 2 months.-thx

## 2011-08-24 NOTE — Telephone Encounter (Signed)
RF's faxed.  I will call pt tomorrow to discuss CPE.

## 2011-09-07 ENCOUNTER — Emergency Department (HOSPITAL_COMMUNITY)
Admission: EM | Admit: 2011-09-07 | Discharge: 2011-09-07 | Disposition: A | Discharge status: Home / Self Care | Payer: 59 | Attending: Emergency Medicine | Admitting: Emergency Medicine

## 2011-09-07 ENCOUNTER — Encounter (HOSPITAL_COMMUNITY): Payer: Self-pay | Admitting: *Deleted

## 2011-09-07 DIAGNOSIS — G43909 Migraine, unspecified, not intractable, without status migrainosus: Secondary | ICD-10-CM

## 2011-09-07 MED ORDER — OXYCODONE-ACETAMINOPHEN 5-325 MG PO TABS: 1.0000 | ORAL_TABLET | ORAL | Status: AC | PRN

## 2011-09-07 MED ORDER — ONDANSETRON HCL 8 MG PO TABS: 8.0000 mg | ORAL_TABLET | Freq: Three times a day (TID) | ORAL | Status: DC | PRN

## 2011-09-07 MED ORDER — DIPHENHYDRAMINE HCL 50 MG/ML IJ SOLN
50.0000 mg | Freq: Once | INTRAMUSCULAR | Status: AC
  Administered 2011-09-07: 50 mg via INTRAMUSCULAR
  Filled 2011-09-07: qty 1

## 2011-09-07 MED ORDER — ONDANSETRON HCL 8 MG PO TABS: 8.0000 mg | ORAL_TABLET | Freq: Three times a day (TID) | ORAL | Status: AC | PRN

## 2011-09-07 MED ORDER — OXYCODONE-ACETAMINOPHEN 5-325 MG PO TABS: 1.0000 | ORAL_TABLET | ORAL | Status: DC | PRN

## 2011-09-07 MED ORDER — ONDANSETRON 8 MG PO TBDP
8.0000 mg | ORAL_TABLET | Freq: Once | ORAL | Status: AC
  Administered 2011-09-07: 8 mg via ORAL
  Filled 2011-09-07: qty 1

## 2011-09-07 MED ORDER — HYDROMORPHONE HCL PF 1 MG/ML IJ SOLN
1.0000 mg | Freq: Once | INTRAMUSCULAR | Status: AC
  Administered 2011-09-07: 1 mg via INTRAVENOUS
  Filled 2011-09-07: qty 1

## 2011-09-07 MED ORDER — SODIUM CHLORIDE 0.9 % IV BOLUS (SEPSIS)
1000.0000 mL | Freq: Once | INTRAVENOUS | Status: AC
  Administered 2011-09-07: 1000 mL via INTRAVENOUS

## 2011-09-07 MED ORDER — DEXAMETHASONE SODIUM PHOSPHATE 4 MG/ML IJ SOLN
10.0000 mg | Freq: Once | INTRAMUSCULAR | Status: AC
  Administered 2011-09-07: 10 mg via INTRAMUSCULAR
  Filled 2011-09-07: qty 4
  Filled 2011-09-07: qty 1

## 2011-09-07 MED ORDER — KETOROLAC TROMETHAMINE 30 MG/ML IJ SOLN
30.0000 mg | Freq: Once | INTRAMUSCULAR | Status: AC
  Administered 2011-09-07: 30 mg via INTRAVENOUS
  Filled 2011-09-07: qty 1

## 2011-09-07 NOTE — ED Notes (Signed)
Pt c/o migraine headache and nausea for 2 days. Pt has a history of migraines. Also states that that her tongue and throat was swollen Tuesday night but had gone away Wedneday morning. Recently had botox injection for migraine.

## 2011-09-07 NOTE — ED Provider Notes (Signed)
History     CSN: [id number]  Arrival date & time 09/07/11  1343   First MD Initiated Contact with Patient 09/07/11 1424      Chief Complaint  Patient presents with  . Migraine    (Consider location/radiation/quality/duration/timing/severity/associated sxs/prior treatment) HPI Comments: Jacqueline Vance presents with a 2 day history of migraine headache which has not relented with her usual relpax therapy.  She also took a percocet last night which was not helpful.  This current headache is like her previous typical migraines with severe pain bilaterally across her parietal scalp along with nausea, no emesis, photophobia and phonophobia.  She denies fevers, chills and has had no neck stiffness,  But states soreness across her neck and shoulders which is also typical for her headaches. She has a neurologist in [facility] and started botox injections about 5 months ago which greatly improved her pain,  But her followup injections 3 weeks have not been as helpful.  The first set of injections included the top of her head,  During the second followup series,  No botox was applied to her parietal area - she believes this is why she has this breakthrough headache. She has had no focal weakness, dizziness,  No visual changes.  The history is provided by the patient.    Past Medical History  Diagnosis Date  . Migraine     Proph tx with topamax helps, abortive tx with relpax helps  . Kidney stones 2005  . Chronic back pain     neck, too  . Anxiety   . Depression     Past Surgical History  Procedure Date  . Laparoscopic vaginal hysterectomy 12/02/07    for DUB  . Breast surgery 1999    reduction  . Kidney stone surgery 2005  . Endometrial ablation 2002    History reviewed. No pertinent family history.  History  Substance Use Topics  . Smoking status: Never Smoker   . Smokeless tobacco: Never Used  . Alcohol Use: No    OB History    Grav Para Term Preterm Abortions TAB SAB Ect  Mult Living   1 1              Review of Systems  Constitutional: Negative for fever.  HENT: Positive for neck pain. Negative for congestion, sore throat and neck stiffness.   Eyes: Negative.   Respiratory: Negative for chest tightness and shortness of breath.   Cardiovascular: Negative for chest pain.  Gastrointestinal: Positive for nausea. Negative for abdominal pain.  Genitourinary: Negative.   Musculoskeletal: Positive for arthralgias. Negative for joint swelling.  Skin: Negative.  Negative for rash and wound.  Neurological: Positive for headaches. Negative for dizziness, facial asymmetry, weakness, light-headedness and numbness.  Hematological: Negative.   Psychiatric/Behavioral: Negative.     Allergies  Review of patient's allergies indicates no known allergies.  Home Medications   Current Outpatient Rx  Name Route Sig Dispense Refill  . ALPRAZOLAM 0.5 MG PO TABS  1-2 tabs po q8h prn 60 tablet 1  . BUPROPION HCL ER (XL) 150 MG PO TB24 Oral Take 1 tablet (150 mg total) by mouth daily. 30 tablet 6  . VITAMIN D PO Oral Take 1 tablet by mouth daily.    Marland Kitchen CITALOPRAM HYDROBROMIDE 40 MG PO TABS Oral Take 1 tablet (40 mg total) by mouth daily. 30 tablet 6  . VITAMIN B-12 PO Oral Take 1 tablet by mouth daily.    Marland Kitchen ELETRIPTAN HYDROBROMIDE 40 MG PO  TABS  One tablet by mouth at onset of headache. May repeat in 2 hours if headache persists or recurs. Must have appointment for more refills. 10 tablet 5  . HYDROCODONE-ACETAMINOPHEN 5-325 MG PO TABS Oral Take 1 tablet by mouth every 6 (six) hours as needed.    Marland Kitchen PANTOPRAZOLE SODIUM 40 MG PO TBEC Oral Take 1 tablet (40 mg total) by mouth daily. 30 tablet 10  . ONDANSETRON HCL 8 MG PO TABS Oral Take 1 tablet (8 mg total) by mouth every 8 (eight) hours as needed for nausea. 12 tablet 0  . OXYCODONE-ACETAMINOPHEN 5-325 MG PO TABS Oral Take 1 tablet by mouth every 4 (four) hours as needed for pain. 20 tablet 0    BP 112/75  Pulse 82  Temp  98.7 F (37.1 C) (Oral)  Resp 16  Ht 5\' 4"  (1.626 m)  Wt 170 lb (77.111 kg)  BMI 29.18 kg/m2  SpO2 97%  Physical Exam  Nursing note and vitals reviewed. Constitutional: She is oriented to person, place, and time. She appears well-developed and well-nourished.       Uncomfortable appearing  HENT:  Head: Normocephalic and atraumatic.  Mouth/Throat: Oropharynx is clear and moist.  Eyes: EOM are normal. Pupils are equal, round, and reactive to light.  Neck: Normal range of motion. Neck supple. Muscular tenderness present. No spinous process tenderness present. No rigidity. Normal range of motion present.       No nuchal rigidity.    Cardiovascular: Normal rate and normal heart sounds.   Pulmonary/Chest: Effort normal.  Abdominal: Soft. There is no tenderness.  Musculoskeletal: Normal range of motion.       Back:       Pt is ttp across bilateral trapezius musculature.    Lymphadenopathy:    She has no cervical adenopathy.  Neurological: She is alert and oriented to person, place, and time. She has normal strength. No sensory deficit. Gait normal. GCS eye subscore is 4. GCS verbal subscore is 5. GCS motor subscore is 6.       Normal heel-shin, normal rapid alternating movements. Cranial nerves III-XII intact.  No pronator drift.  Skin: Skin is warm and dry. No rash noted.  Psychiatric: She has a normal mood and affect. Her speech is normal and behavior is normal. Thought content normal. Cognition and memory are normal.    ED Course  Procedures (including critical care time)  Labs Reviewed - No data to display No results found.   1. Migraine    Patient given IM injections with decadron 10 mg and benadryl 50 mg , zofran 8 odt with no improvement in headache, nausea better.  IV started,  NS per IV 2 liters given.  Dilaudid 1 mg IV,  toradol 30 IV with considerable improvement 1/10 at time of dc.     MDM  Chronic migraine with acute episode, no focal neuro deficits on exam.   Percocet, zofran prescribed if needed for home use.  Encouraged to rest,  Recheck by pcp or neurologist if sx persist tomorrow, or worsen.  The patient appears reasonably screened and/or stabilized for discharge and I doubt any other medical condition or other [facility] requiring further screening, evaluation, or treatment in the ED at this time prior to discharge.         Burgess Amor, [place] 09/07/11 1752

## 2011-09-07 NOTE — ED Notes (Signed)
Patient has had migraine x 5 days.  Takes meds for chronic migraines and also took Percocet last PM w/out any relief.  She has recv'd. botox injections once before which helped w/frequency of migraines.  Had additional injections approx. 3 weeks ago, but it has not affected her this time.

## 2011-09-07 NOTE — ED Notes (Signed)
Patient with no complaints at this time. Respirations even and unlabored. Skin warm/dry. Discharge instructions reviewed with patient at this time. Patient given opportunity to voice concerns/ask questions. IV removed per policy and band-aid applied to site. Patient discharged at this time and left Emergency Department with steady gait.  

## 2011-09-07 NOTE — ED Provider Notes (Signed)
Medical screening examination/treatment/procedure(s) were performed by non-physician practitioner and as supervising physician I was immediately available for consultation/collaboration.   Carleene Cooper III, MD 09/07/11 [phone]

## 2011-09-08 ENCOUNTER — Ambulatory Visit (INDEPENDENT_AMBULATORY_CARE_PROVIDER_SITE_OTHER): Payer: 59 | Admitting: Family Medicine

## 2011-09-08 ENCOUNTER — Encounter: Payer: Self-pay | Admitting: Family Medicine

## 2011-09-08 VITALS — BP 114/77 | HR 84 | Ht 63.5 in | Wt 179.0 lb

## 2011-09-08 DIAGNOSIS — R5383 Other fatigue: Secondary | ICD-10-CM

## 2011-09-08 DIAGNOSIS — Z8669 Personal history of other diseases of the nervous system and sense organs: Secondary | ICD-10-CM

## 2011-09-08 DIAGNOSIS — Z0000 Encounter for general adult medical examination without abnormal findings: Secondary | ICD-10-CM

## 2011-09-08 DIAGNOSIS — E538 Deficiency of other specified B group vitamins: Secondary | ICD-10-CM

## 2011-09-08 DIAGNOSIS — R5381 Other malaise: Secondary | ICD-10-CM

## 2011-09-08 DIAGNOSIS — Z23 Encounter for immunization: Secondary | ICD-10-CM

## 2011-09-08 DIAGNOSIS — G43009 Migraine without aura, not intractable, without status migrainosus: Secondary | ICD-10-CM

## 2011-09-08 DIAGNOSIS — E559 Vitamin D deficiency, unspecified: Secondary | ICD-10-CM

## 2011-09-08 LAB — COMPREHENSIVE METABOLIC PANEL
ALT: 38 U/L — ABNORMAL HIGH (ref 0–35)
AST: 36 U/L (ref 0–37)
Albumin: 3.7 g/dL (ref 3.5–5.2)
Alkaline Phosphatase: 74 U/L (ref 39–117)
BUN: 5 mg/dL — ABNORMAL LOW (ref 6–23)
CO2: 25 mEq/L (ref 19–32)
Calcium: 9.3 mg/dL (ref 8.4–10.5)
Chloride: 107 mEq/L (ref 96–112)
Creatinine, Ser: 0.8 mg/dL (ref 0.4–1.2)
GFR: 78.69 mL/min (ref >60.00)
Glucose, Bld: 109 mg/dL — ABNORMAL HIGH (ref 70–99)
Potassium: 3.8 mEq/L (ref 3.5–5.1)
Sodium: 139 mEq/L (ref 135–145)
Total Bilirubin: 0.4 mg/dL (ref 0.3–1.2)
Total Protein: 6.8 g/dL (ref 6.0–8.3)

## 2011-09-08 LAB — CBC WITH DIFFERENTIAL/PLATELET
Basophils Absolute: 0 10*3/uL (ref 0.0–0.1)
Basophils Relative: 0.2 % (ref 0.0–3.0)
Eosinophils Absolute: 0 10*3/uL (ref 0.0–0.7)
Eosinophils Relative: 0 % (ref 0.0–5.0)
HCT: 38.7 % (ref 36.0–46.0)
Hemoglobin: 12.6 g/dL (ref 12.0–15.0)
Lymphocytes Relative: 11 % — ABNORMAL LOW (ref 12.0–46.0)
Lymphs Abs: 1.9 10*3/uL (ref 0.7–4.0)
MCHC: 32.7 g/dL (ref 30.0–36.0)
MCV: 89.6 fl (ref 78.0–100.0)
Monocytes Absolute: 0.9 10*3/uL (ref 0.1–1.0)
Monocytes Relative: 5.4 % (ref 3.0–12.0)
Neutro Abs: 14 10*3/uL — ABNORMAL HIGH (ref 1.4–7.7)
Neutrophils Relative %: 83.4 % — ABNORMAL HIGH (ref 43.0–77.0)
Platelets: 212 10*3/uL (ref 150.0–400.0)
RBC: 4.32 Mil/uL (ref 3.87–5.11)
RDW: 13.4 % (ref 11.5–14.6)
WBC: 16.8 10*3/uL — ABNORMAL HIGH (ref 4.5–10.5)

## 2011-09-08 LAB — VITAMIN B12: Vitamin B-12: 1292 pg/mL — ABNORMAL HIGH (ref 211–911)

## 2011-09-08 LAB — TSH: TSH: 0.58 u[IU]/mL (ref 0.35–5.50)

## 2011-09-08 NOTE — Patient Instructions (Signed)
Restart your topamax at 25mg  per day, increase by 25mg  q week to max of 150mg  qd.

## 2011-09-08 NOTE — Assessment & Plan Note (Signed)
Recent poor control since getting off of her topamax.  Her botox injections may help some but I don't think she can be off of her topamax. Her recent acute, prolonged migraine has resolved as of today.  She'll restart topamax at 25mg  qd and increase by 25mg  qd each week to a max of 150mg  qd like she was on before.  She'll continue prn relpax for abortive med, plus she has both some vicodin and percocet around if she should have to resort to this--she knows not to use both of these meds together.

## 2011-09-08 NOTE — Assessment & Plan Note (Signed)
She never got her labs done after last visit 05/26/11--will get these here today. She had more like a viral syndrome at that time, but I think these labs are still indicated now.

## 2011-09-08 NOTE — Progress Notes (Signed)
OFFICE NOTE  09/08/2011  CC:  Chief Complaint  Patient presents with  . Follow-up    migraine, meds, needs 3rd Hep B     HPI: Patient is a 51 y.o. Caucasian female who is here for f/u acute Migraine, has chronic migraine HA syndrome. Went to ED yesterday where eval was unremarkable, no labs or imaging were required. She was given dilaudid 1mg  IV, toradol 30mg  IV, decadron 10mg  IV and zofran.  She took a relpax and a percocet earlier today as well and the migraine has now broken.  She was told to f/u here today.   Recently she got another round of botox injections--pt says smaller needles, about 30 injections, none in top of scalp like the prevous time.  This is the region where she had the most recent pain, lasted x 1 wk, unresponsive to relpax.  She had already weened herself off of topamax but seems to still be having migraines--possibly a little less than before botox but response to initial botox shots was more convincing than most recent round of shots.   Pertinent PMH:  Past Medical History  Diagnosis Date  . Migraine     Proph tx with topamax helps, abortive tx with relpax helps  . Kidney stones 2005  . Chronic back pain     neck, too  . Anxiety   . Depression    Past surgical, social, and family history reviewed and no changes noted since last office visit.  MEDS:  Outpatient Prescriptions Prior to Visit  Medication Sig Dispense Refill  . ALPRAZolam (XANAX) 0.5 MG tablet 1-2 tabs po q8h prn  60 tablet  1  . buPROPion (WELLBUTRIN XL) 150 MG 24 hr tablet Take 1 tablet (150 mg total) by mouth daily.  30 tablet  6  . Cholecalciferol (VITAMIN D PO) Take 1 tablet by mouth daily.      . citalopram (CELEXA) 40 MG tablet Take 1 tablet (40 mg total) by mouth daily.  30 tablet  6  . Cyanocobalamin (VITAMIN B-12 PO) Take 1 tablet by mouth daily.      Marland Kitchen eletriptan (RELPAX) 40 MG tablet One tablet by mouth at onset of headache. May repeat in 2 hours if headache persists or recurs.  Must have appointment for more refills.  10 tablet  5  . HYDROcodone-acetaminophen (NORCO/VICODIN) 5-325 MG per tablet Take 1 tablet by mouth every 6 (six) hours as needed.      . ondansetron (ZOFRAN) 8 MG tablet Take 1 tablet (8 mg total) by mouth every 8 (eight) hours as needed for nausea.  12 tablet  0  . oxyCODONE-acetaminophen (PERCOCET/ROXICET) 5-325 MG per tablet Take 1 tablet by mouth every 4 (four) hours as needed for pain.  20 tablet  0  . pantoprazole (PROTONIX) 40 MG tablet Take 1 tablet (40 mg total) by mouth daily.  30 tablet  10   Facility-Administered Medications Prior to Visit  Medication Dose Route Frequency Provider Last Rate Last Dose  . dexamethasone (DECADRON) injection 10 mg  10 mg Intramuscular Once Burgess Amor, PA   10 mg at 09/07/11 1459  . diphenhydrAMINE (BENADRYL) injection 50 mg  50 mg Intramuscular Once Burgess Amor, PA   50 mg at 09/07/11 1458  . HYDROmorphone (DILAUDID) injection 1 mg  1 mg Intravenous Once Burgess Amor, PA   1 mg at 09/07/11 1553  . ketorolac (TORADOL) 30 MG/ML injection 30 mg  30 mg Intravenous Once [employer], PA   30 mg at  09/07/11 1650  . ondansetron (ZOFRAN-ODT) disintegrating tablet 8 mg  8 mg Oral Once Burgess Amor, PA   8 mg at 09/07/11 1458  . sodium chloride 0.9 % bolus 1,000 mL  1,000 mL Intravenous Once Burgess Amor, PA   1,000 mL at 09/07/11 1553  . sodium chloride 0.9 % bolus 1,000 mL  1,000 mL Intravenous Once Burgess Amor, PA   1,000 mL at 09/07/11 1653    PE: Blood pressure 114/77, pulse 84, height 5' 3.5" (1.613 m), weight 179 lb (81.194 kg). Gen: Alert, well appearing.  Patient is oriented to person, place, time, and situation. Neuro: CN 2-12 intact bilaterally, strength 5/5 in proximal and distal upper extremities and lower extremities bilaterally.  No sensory deficits.  No tremor.  No disdiadochokinesis.  No ataxia.  Upper extremity and lower extremity DTRs symmetric.  No pronator drift. ENT: Ears: EACs clear, normal epithelium.  TMs  with good light reflex and landmarks bilaterally.  Eyes: no injection, icteris, swelling, or exudate.  EOMI, PERRLA. Nose: no drainage or turbinate edema/swelling.  No injection or focal lesion.  Mouth: lips without lesion/swelling.  Oral mucosa pink and moist.  Dentition intact and without obvious caries or gingival swelling.  Oropharynx without erythema, exudate, or swelling.  Neck: nontender.  Trapezius and shoulder regions nontender.  ROm of neck and shoulders intact.  IMPRESSION AND PLAN:  History of migraine headaches Recent poor control since getting off of her topamax.  Her botox injections may help some but I don't think she can be off of her topamax. Her recent acute, prolonged migraine has resolved as of today.  She'll restart topamax at 25mg  qd and increase by 25mg  qd each week to a max of 150mg  qd like she was on before.  She'll continue prn relpax for abortive med, plus she has both some vicodin and percocet around if she should have to resort to this--she knows not to use both of these meds together.    Fatigue She never got her labs done after last visit 05/26/11--will get these here today. She had more like a viral syndrome at that time, but I think these labs are still indicated now.  Preventative health care Reminded pt that she is overdue for mammogram and she said she'll call today to get this set up.     FOLLOW UP: 40mo

## 2011-09-08 NOTE — Assessment & Plan Note (Signed)
Reminded pt that she is overdue for mammogram and she said she'll call today to get this set up.

## 2011-09-09 LAB — VITAMIN D 25 HYDROXY (VIT D DEFICIENCY, FRACTURES): Vit D, 25-Hydroxy: 37 ng/mL (ref 30–89)

## 2011-09-13 LAB — METHYLMALONIC ACID, SERUM: Methylmalonic Acid, Quant: 0.15 umol/L (ref <0.40)

## 2011-09-21 ENCOUNTER — Telehealth: Payer: Self-pay | Admitting: Family Medicine

## 2011-09-21 NOTE — Telephone Encounter (Signed)
Do you want pt to come in prior to making referral?  Please advise.

## 2011-09-21 NOTE — Telephone Encounter (Signed)
Pls have her come in for exam first, then we'll order the necessary test.  -thx

## 2011-09-22 ENCOUNTER — Other Ambulatory Visit: Payer: Self-pay | Admitting: Obstetrics and Gynecology

## 2011-09-22 DIAGNOSIS — Z1231 Encounter for screening mammogram for malignant neoplasm of breast: Secondary | ICD-10-CM

## 2011-09-22 NOTE — Telephone Encounter (Signed)
Pt would prefer female provider do exam.  Offered appt with Dr. Abner Greenspan, but patient declined.  She will call her GYN for exam.

## 2011-09-25 ENCOUNTER — Ambulatory Visit: Payer: 59

## 2011-10-17 ENCOUNTER — Other Ambulatory Visit: Payer: Self-pay

## 2011-10-17 MED ORDER — CITALOPRAM HYDROBROMIDE 40 MG PO TABS: 40.0000 mg | ORAL_TABLET | Freq: Every day | ORAL | Status: DC

## 2011-10-30 ENCOUNTER — Other Ambulatory Visit: Payer: Self-pay

## 2011-10-30 MED ORDER — PANTOPRAZOLE SODIUM 40 MG PO TBEC: 40.0000 mg | DELAYED_RELEASE_TABLET | Freq: Every day | ORAL | Status: DC

## 2011-12-15 ENCOUNTER — Other Ambulatory Visit: Payer: Self-pay | Admitting: Family Medicine

## 2011-12-15 MED ORDER — CITALOPRAM HYDROBROMIDE 40 MG PO TABS: 40.0000 mg | ORAL_TABLET | Freq: Every day | ORAL | Status: DC

## 2011-12-15 MED ORDER — HYDROCODONE-ACETAMINOPHEN 5-325 MG PO TABS: 1.0000 | ORAL_TABLET | Freq: Four times a day (QID) | ORAL | Status: DC | PRN

## 2011-12-15 MED ORDER — BUPROPION HCL ER (XL) 150 MG PO TB24: 150.0000 mg | ORAL_TABLET | Freq: Every day | ORAL | Status: DC

## 2011-12-15 MED ORDER — PANTOPRAZOLE SODIUM 40 MG PO TBEC: 40.0000 mg | DELAYED_RELEASE_TABLET | Freq: Every day | ORAL | Status: DC

## 2011-12-15 MED ORDER — ELETRIPTAN HYDROBROMIDE 40 MG PO TABS: ORAL_TABLET | ORAL | Status: DC

## 2012-01-04 ENCOUNTER — Telehealth: Payer: Self-pay | Admitting: Family Medicine

## 2012-01-04 NOTE — Telephone Encounter (Signed)
Noted  

## 2012-01-04 NOTE — Telephone Encounter (Signed)
Return call to home number; voicemail has not been set up.  Unable to leave message.

## 2012-01-04 NOTE — Telephone Encounter (Signed)
Advised she has not been seen recently for this issue.  She has been using medication of friend/coworker.  Advised we usually do not call in meds for this issue, office visit needed first.  She is unable to come tomorrow.  Advised we have Saturday clinic hours.  She will call back tomorrow if she is not feeling better for possible appt on Saturday.

## 2012-01-04 NOTE — Telephone Encounter (Signed)
Patient is requesting a different medicine for her pink eye, she is not getting any better.

## 2012-01-04 NOTE — Telephone Encounter (Signed)
Please advise 

## 2012-03-05 ENCOUNTER — Ambulatory Visit: Payer: 59 | Admitting: Family Medicine

## 2012-03-29 ENCOUNTER — Other Ambulatory Visit: Payer: Self-pay | Admitting: *Deleted

## 2012-03-29 NOTE — Telephone Encounter (Addendum)
Faxed refill request received from pharmacy for ALPRAZOLAM Last filled by MD on 08/24/11, #60 X 1  Faxed refill request received from pharmacy for HYDROCODONE Last filled by MD on 12/15/11, #90 X 1  Last seen on 09/08/11 Follow up 6 MONTHS, 03/05/12 appt cancelled due to physician unavailable, appt not rescheduled. Please advise refills.  [facility] [phone]

## 2012-03-29 NOTE — Telephone Encounter (Signed)
Pls call [facility] and ask what kind of MD Dr. Zenovia Jordan is (is she general internist or is she a specialist of some kind?).  Clyda saw her in January of this year--self referral--and their office sent me the note.  Pls see if Janna ever followed up with them (in 1 mo) as per the plan in the office note I reviewed. Then I'll get back to you about the RF requests that are pending.-thx

## 2012-04-01 MED ORDER — HYDROCODONE-ACETAMINOPHEN 5-325 MG PO TABS: 1.0000 | ORAL_TABLET | Freq: Four times a day (QID) | ORAL | Status: DC | PRN

## 2012-04-01 MED ORDER — ALPRAZOLAM 0.5 MG PO TABS: 0.5000 mg | ORAL_TABLET | Freq: Three times a day (TID) | ORAL | Status: DC | PRN

## 2012-04-01 NOTE — Telephone Encounter (Signed)
OK, rx's filled.  Please have pt schedule routine f/u in the next 2-3 months.-thx

## 2012-04-01 NOTE — Telephone Encounter (Signed)
Dr. Nickola Major practices rheumatology.  Jacqueline Vance was a no-show to her 03/06/12 follow up appt.

## 2012-04-02 NOTE — Telephone Encounter (Signed)
Faxed Rx orders to pharmacy, received call from pharmacy stating unable to read scripts; gave VO via phone/SLS

## 2012-05-27 ENCOUNTER — Telehealth: Payer: Self-pay | Admitting: *Deleted

## 2012-05-27 MED ORDER — ELETRIPTAN HYDROBROMIDE 40 MG PO TABS: ORAL_TABLET | ORAL | Status: DC

## 2012-05-27 NOTE — Telephone Encounter (Signed)
Rx done today. Pls encourage pt to make routine office f/u sometime before September 2014.  --thx

## 2012-05-27 NOTE — Telephone Encounter (Signed)
Faxed refill request received from pharmacy for RELPAX  Last filled by MD on 12/15/11, #30 X 1  Last seen on 09/08/11  Follow up 6 MONTHS, 03/05/12 appt cancelled due to physician unavailable, appt not rescheduled.  Please advise refills.  [facility] [phone]

## 2012-07-05 ENCOUNTER — Telehealth: Payer: Self-pay | Admitting: *Deleted

## 2012-07-05 NOTE — Telephone Encounter (Signed)
This office is not prescribing lidoderm patches. Please advise pharmacy.

## 2012-07-05 NOTE — Telephone Encounter (Signed)
Pharmacy informed.

## 2012-07-05 NOTE — Telephone Encounter (Signed)
Lidoderm 5% Topical Pat # 90  Apply 1 to 3 patches to affected ares as needed,  12 hours on and 12 hours off Please advise refill?

## 2012-07-26 ENCOUNTER — Ambulatory Visit: Payer: 59 | Admitting: Family Medicine

## 2012-07-29 ENCOUNTER — Encounter: Payer: 59 | Admitting: Family Medicine

## 2012-08-06 ENCOUNTER — Encounter: Payer: 59 | Admitting: Family Medicine

## 2012-08-07 ENCOUNTER — Encounter: Payer: 59 | Admitting: Family Medicine

## 2012-08-09 ENCOUNTER — Encounter: Payer: 59 | Admitting: Family Medicine

## 2012-08-12 ENCOUNTER — Ambulatory Visit (INDEPENDENT_AMBULATORY_CARE_PROVIDER_SITE_OTHER): Payer: 59 | Admitting: Family Medicine

## 2012-08-12 ENCOUNTER — Encounter: Payer: Self-pay | Admitting: Family Medicine

## 2012-08-12 VITALS — BP 120/83 | HR 72 | Temp 98.0°F | Resp 16 | Ht 63.5 in | Wt 183.0 lb

## 2012-08-12 DIAGNOSIS — Z0000 Encounter for general adult medical examination without abnormal findings: Secondary | ICD-10-CM

## 2012-08-12 LAB — CBC WITH DIFFERENTIAL/PLATELET
Basophils Absolute: 0 10*3/uL (ref 0.0–0.1)
Basophils Relative: 0.7 % (ref 0.0–3.0)
Eosinophils Absolute: 0.1 10*3/uL (ref 0.0–0.7)
Eosinophils Relative: 1.4 % (ref 0.0–5.0)
HCT: 41.8 % (ref 36.0–46.0)
Hemoglobin: 14.2 g/dL (ref 12.0–15.0)
Lymphocytes Relative: 31 % (ref 12.0–46.0)
Lymphs Abs: 2.2 10*3/uL (ref 0.7–4.0)
MCHC: 33.9 g/dL (ref 30.0–36.0)
MCV: 91.8 fl (ref 78.0–100.0)
Monocytes Absolute: 0.6 10*3/uL (ref 0.1–1.0)
Monocytes Relative: 8.2 % (ref 3.0–12.0)
Neutro Abs: 4.1 10*3/uL (ref 1.4–7.7)
Neutrophils Relative %: 58.7 % (ref 43.0–77.0)
Platelets: 276 10*3/uL (ref 150.0–400.0)
RBC: 4.55 Mil/uL (ref 3.87–5.11)
RDW: 13.7 % (ref 11.5–14.6)
WBC: 7 10*3/uL (ref 4.5–10.5)

## 2012-08-12 LAB — COMPREHENSIVE METABOLIC PANEL
ALT: 16 U/L (ref 0–35)
AST: 22 U/L (ref 0–37)
Albumin: 3.7 g/dL (ref 3.5–5.2)
Alkaline Phosphatase: 76 U/L (ref 39–117)
BUN: 6 mg/dL (ref 6–23)
CO2: 28 mEq/L (ref 19–32)
Calcium: 9.7 mg/dL (ref 8.4–10.5)
Chloride: 103 mEq/L (ref 96–112)
Creatinine, Ser: 0.8 mg/dL (ref 0.4–1.2)
GFR: 79.51 mL/min (ref >60.00)
Glucose, Bld: 83 mg/dL (ref 70–99)
Potassium: 3.8 mEq/L (ref 3.5–5.1)
Sodium: 138 mEq/L (ref 135–145)
Total Bilirubin: 0.4 mg/dL (ref 0.3–1.2)
Total Protein: 6.9 g/dL (ref 6.0–8.3)

## 2012-08-12 LAB — TSH: TSH: 1.26 u[IU]/mL (ref 0.35–5.50)

## 2012-08-12 MED ORDER — PANTOPRAZOLE SODIUM 40 MG PO TBEC: 40.0000 mg | DELAYED_RELEASE_TABLET | Freq: Every day | ORAL | Status: DC

## 2012-08-12 MED ORDER — ALPRAZOLAM 0.5 MG PO TABS: 0.5000 mg | ORAL_TABLET | Freq: Three times a day (TID) | ORAL | Status: DC | PRN

## 2012-08-12 MED ORDER — MUPIROCIN 2 % EX OINT: TOPICAL_OINTMENT | Freq: Three times a day (TID) | CUTANEOUS | Status: DC

## 2012-08-12 MED ORDER — HYDROCODONE-ACETAMINOPHEN 5-325 MG PO TABS: 1.0000 | ORAL_TABLET | Freq: Four times a day (QID) | ORAL | Status: DC | PRN

## 2012-08-12 MED ORDER — ELETRIPTAN HYDROBROMIDE 40 MG PO TABS: ORAL_TABLET | ORAL | Status: DC

## 2012-08-12 MED ORDER — CHLORZOXAZONE 500 MG PO TABS: 500.0000 mg | ORAL_TABLET | Freq: Three times a day (TID) | ORAL | Status: DC | PRN

## 2012-08-12 MED ORDER — CITALOPRAM HYDROBROMIDE 40 MG PO TABS: 40.0000 mg | ORAL_TABLET | Freq: Every day | ORAL | Status: DC

## 2012-08-12 NOTE — Progress Notes (Signed)
Office Note 08/12/2012  CC:  Chief Complaint  Patient presents with  . Annual Exam    HPI:  Jacqueline Vance is a 52 y.o. White female who is here for CPE. Lost job due to downsizing recently, feels more depressed and tired as a result. Not exercising.  Back pain and headaches bothering her still. Lately has been put on chlorzoxazone tid prn and neurontin tid by her back MD, asks me to RF these b/c she has noted some help from these.   Past Medical History  Diagnosis Date  . Migraine     Proph tx with topamax helps, abortive tx with relpax helps  . Kidney stones 2005  . Chronic back pain     neck, too  . Anxiety   . Depression     Past Surgical History  Procedure Laterality Date  . Laparoscopic vaginal hysterectomy  12/02/07    for DUB  . Breast surgery  1999    reduction  . Kidney stone surgery  2005  . Endometrial ablation  2002    Family History  Problem Relation Age of Onset  . Cancer Mother     brain ca (glioblastoma)    History   Social History  . Marital Status: Married    Spouse Name: N/A    Number of Children: N/A  . Years of Education: N/A   Occupational History  . Not on file.   Social History Main Topics  . Smoking status: Never Smoker   . Smokeless tobacco: Never Used  . Alcohol Use: No  . Drug Use: No  . Sexually Active: Not on file   Other Topics Concern  . Not on file   Social History Narrative   Married, one adult son.  Lives in [place], [place].   Worked for [employer] in [place] but got laid off approx June/July 2014. Job duties required sitting in front of a computer all day.   Exercise: none recently, used to walk daily at lunch.   No T/A/Ds.                Outpatient Prescriptions Prior to Visit  Medication Sig Dispense Refill  . buPROPion (WELLBUTRIN XL) 150 MG 24 hr tablet Take 1 tablet (150 mg total) by mouth daily.  90 tablet  1  . Cholecalciferol (VITAMIN D PO) Take 1 tablet by mouth daily.      . Cyanocobalamin  (VITAMIN B-12 PO) Take 1 tablet by mouth daily.      Marland Kitchen ALPRAZolam (XANAX) 0.5 MG tablet Take 1-2 tablets (0.5-1 mg total) by mouth every 8 (eight) hours as needed.  60 tablet  1  . citalopram (CELEXA) 40 MG tablet Take 1 tablet (40 mg total) by mouth daily.  90 tablet  1  . eletriptan (RELPAX) 40 MG tablet One tablet by mouth at onset of headache. May repeat in 2 hours if headache persists or recurs. Must have appointment for more refills.  30 tablet  2  . HYDROcodone-acetaminophen (NORCO/VICODIN) 5-325 MG per tablet Take 1 tablet by mouth every 6 (six) hours as needed.  90 tablet  1  . pantoprazole (PROTONIX) 40 MG tablet Take 1 tablet (40 mg total) by mouth daily.  90 tablet  1   No facility-administered medications prior to visit.  Chlorhexazone 500mg  tid prn, gabapentin 300mg  tid.  No Known Allergies  ROS Review of Systems  Constitutional: Positive for fatigue. Negative for fever, chills and appetite change.  HENT: Negative for ear pain,  congestion, sore throat, neck stiffness and dental problem.   Eyes: Negative for discharge, redness and visual disturbance.  Respiratory: Negative for cough, chest tightness, shortness of breath and wheezing.   Cardiovascular: Negative for chest pain, palpitations and leg swelling.  Gastrointestinal: Negative for nausea, vomiting, abdominal pain, diarrhea and blood in stool.  Genitourinary: Negative for dysuria, urgency, frequency, hematuria, flank pain and difficulty urinating.  Musculoskeletal: Positive for back pain (chronic). Negative for myalgias, joint swelling and arthralgias.  Skin: Negative for pallor and rash.  Neurological: Negative for dizziness, speech difficulty, weakness and headaches.  Hematological: Negative for adenopathy. Does not bruise/bleed easily.  Psychiatric/Behavioral: Positive for dysphoric mood. Negative for confusion and sleep disturbance. The patient is nervous/anxious.     PE; Blood pressure 120/83, pulse 72,  temperature 98 F (36.7 C), temperature source Temporal, resp. rate 16, height 5' 3.5" (1.613 m), weight 183 lb (83.008 kg), SpO2 97.00%. Gen: Alert, well appearing.  Patient is oriented to person, place, time, and situation. AFFECT: pleasant, lucid thought and speech. ENT: Ears: EACs clear, normal epithelium.  TMs with good light reflex and landmarks bilaterally.  Eyes: no injection, icteris, swelling, or exudate.  EOMI, PERRLA. Nose: no drainage or turbinate edema/swelling.  No injection or focal lesion.  Mouth: lips without lesion/swelling.  Oral mucosa pink and moist.  Dentition intact and without obvious caries or gingival swelling.  Oropharynx without erythema, exudate, or swelling.  Neck: supple/nontender.  No LAD, mass, or TM.  Carotid pulses 2+ bilaterally, without bruits. CV: RRR, no m/r/g.   LUNGS: CTA bilat, nonlabored resps, good aeration in all lung fields. ABD: soft, NT, ND, BS normal.  No hepatospenomegaly or mass.  No bruits. EXT: no clubbing, cyanosis, or edema.  Musculoskeletal: no joint swelling, erythema, warmth, or tenderness.  ROM of all joints intact. Skin - no sores or suspicious lesions or rashes or color changes  Pertinent labs:  None today  ASSESSMENT AND PLAN:   Health maintenance examination Reviewed age and gender appropriate health maintenance issues (prudent diet, regular exercise, health risks of tobacco and excessive alcohol, use of seatbelts, fire alarms in home, use of sunscreen).  Also reviewed age and gender appropriate health screening as well as vaccine recommendations. Suspect her worsened fatigue and depression lately are a combination of situational stress + lack of exercise. Will check CBC, TSH, and CMET today. Need for screening colonoscopy discussed but she'll defer this for now---wait until she gets another job and health insurance again. She gets mammography later this week. Vaccines are UTD. RF'd all meds x 90d supply.  An After Visit  Summary was printed and given to the patient.  FOLLOW UP:  Return if symptoms worsen or fail to improve.

## 2012-08-12 NOTE — Assessment & Plan Note (Signed)
Reviewed age and gender appropriate health maintenance issues (prudent diet, regular exercise, health risks of tobacco and excessive alcohol, use of seatbelts, fire alarms in home, use of sunscreen).  Also reviewed age and gender appropriate health screening as well as vaccine recommendations. Suspect her worsened fatigue and depression lately are a combination of situational stress + lack of exercise. Will check CBC, TSH, and CMET today. Need for screening colonoscopy discussed but she'll defer this for now---wait until she gets another job and health insurance again. She gets mammography later this week. Vaccines are UTD. RF'd all meds x 90d supply.

## 2012-08-13 ENCOUNTER — Encounter: Payer: Self-pay | Admitting: Family Medicine

## 2012-08-14 ENCOUNTER — Other Ambulatory Visit: Payer: Self-pay | Admitting: Family Medicine

## 2012-08-14 MED ORDER — BUPROPION HCL ER (XL) 150 MG PO TB24: 150.0000 mg | ORAL_TABLET | Freq: Every day | ORAL | Status: DC

## 2012-08-14 NOTE — Telephone Encounter (Signed)
Spoke to pharmacy staff at [facility] and they do have that medication ready for patient to pick up.  Patient aware.

## 2012-08-14 NOTE — Telephone Encounter (Signed)
[facility] pharmacy does not have any supply of the wellbutrin.  Patient told me to send it to [facility] in [place] to see if they have any.  I tried to call [facility] but there was no answer at pharmacy.

## 2012-09-20 ENCOUNTER — Telehealth: Payer: Self-pay | Admitting: Family Medicine

## 2012-09-20 MED ORDER — HYDROCODONE-ACETAMINOPHEN 5-325 MG PO TABS: 1.0000 | ORAL_TABLET | Freq: Four times a day (QID) | ORAL | Status: DC | PRN

## 2012-09-20 MED ORDER — ELETRIPTAN HYDROBROMIDE 40 MG PO TABS: ORAL_TABLET | ORAL | Status: DC

## 2012-09-20 NOTE — Telephone Encounter (Signed)
Patient also requesting refill on he relpax 40 mg.

## 2012-09-20 NOTE — Telephone Encounter (Signed)
OK to RF x 1 as previously prescribed.

## 2012-09-20 NOTE — Telephone Encounter (Signed)
Patient was last seen on 08/13/12 and got medication 08/12/12 x 0 refills.  Please advise refill of hydrocodone.

## 2012-11-18 ENCOUNTER — Telehealth: Payer: Self-pay | Admitting: Family Medicine

## 2012-11-18 NOTE — Telephone Encounter (Signed)
Patient requesting refill on alprazolam.  Patient last seen on 08/10/12.  Rx last printed 08/12/12 x 1 refill.   Please advise.

## 2012-11-19 MED ORDER — ALPRAZOLAM 0.5 MG PO TABS: 0.5000 mg | ORAL_TABLET | Freq: Three times a day (TID) | ORAL | Status: DC | PRN

## 2012-11-19 NOTE — Telephone Encounter (Signed)
Faxed rx to pharmacy  

## 2012-11-19 NOTE — Telephone Encounter (Signed)
OK to RF as previously prescribed, with 1 additional RF.-thx

## 2012-11-21 ENCOUNTER — Other Ambulatory Visit: Payer: Self-pay

## 2012-12-16 ENCOUNTER — Other Ambulatory Visit: Payer: Self-pay | Admitting: *Deleted

## 2012-12-16 MED ORDER — HYDROCODONE-ACETAMINOPHEN 5-325 MG PO TABS: 1.0000 | ORAL_TABLET | Freq: Four times a day (QID) | ORAL | Status: DC | PRN

## 2012-12-16 NOTE — Telephone Encounter (Signed)
Refill request for Norco/Vicodin Last filled by MD on - 09/20/12 #90 x1 Last Appt- 08/12/12 Next Appt- none Please advise refill?

## 2013-02-05 ENCOUNTER — Telehealth: Payer: Self-pay | Admitting: Family Medicine

## 2013-02-06 MED ORDER — CELECOXIB 200 MG PO CAPS: 200.0000 mg | ORAL_CAPSULE | Freq: Every day | ORAL | Status: DC

## 2013-02-06 NOTE — Telephone Encounter (Signed)
OK to do rx for celebrex 200mg , 1 tab po qd prn pain, #30, RF 5.-thx

## 2013-02-06 NOTE — Telephone Encounter (Signed)
rx sent to pharmacy

## 2013-02-06 NOTE — Telephone Encounter (Signed)
Patient requesting refill of celebrex to her pharmacy.  I don't see where she is even on this medication.   Patient last seen on 08/12/12.  Please advise refill.

## 2013-02-11 ENCOUNTER — Other Ambulatory Visit: Payer: Self-pay | Admitting: Family Medicine

## 2013-02-11 NOTE — Telephone Encounter (Signed)
Rx faxed by Dr. Anitra Lauth.

## 2013-02-11 NOTE — Telephone Encounter (Signed)
Rx for alprazolam printed.

## 2013-02-11 NOTE — Telephone Encounter (Signed)
Patient requesting refill on xanax.  Patient last seen on 08/12/12.  Last rx printed 11/19/12 x 1 refill.  Please advise refill.

## 2013-02-13 ENCOUNTER — Other Ambulatory Visit: Payer: Self-pay | Admitting: Family Medicine

## 2013-03-11 ENCOUNTER — Other Ambulatory Visit: Payer: Self-pay | Admitting: Family Medicine

## 2013-03-12 NOTE — Telephone Encounter (Signed)
Last refill on 08/12/2012 #180 no refills, Last OV 08/12/2012

## 2013-03-21 ENCOUNTER — Other Ambulatory Visit: Payer: Self-pay | Admitting: Family Medicine

## 2013-03-28 ENCOUNTER — Telehealth: Payer: Self-pay | Admitting: Family Medicine

## 2013-03-28 NOTE — Telephone Encounter (Signed)
Patient states the relpax is not helping with her migraines, she has has spoken with her pharmacist who suggested for her to try midrin or the generic.

## 2013-03-28 NOTE — Telephone Encounter (Signed)
Please advise 

## 2013-03-28 NOTE — Telephone Encounter (Signed)
Dr Anitra Lauth any suggestions?

## 2013-05-15 ENCOUNTER — Other Ambulatory Visit: Payer: Self-pay | Admitting: Family Medicine

## 2013-05-15 NOTE — Telephone Encounter (Signed)
Pt requesting refill of citalopram.  Last OV was 08/12/12.  I don't think this patient has insurance at this time.  Please advise.

## 2013-05-15 NOTE — Telephone Encounter (Signed)
May RF 90 day supply as previously prescribed, no additional RF's.  I need to have her do an office f/u when she is done with this 90d supply before I can continue rx'ing meds.

## 2013-05-29 ENCOUNTER — Telehealth: Payer: Self-pay | Admitting: Family Medicine

## 2013-05-29 MED ORDER — HYDROCODONE-ACETAMINOPHEN 5-325 MG PO TABS: 1.0000 | ORAL_TABLET | Freq: Four times a day (QID) | ORAL | Status: DC | PRN

## 2013-05-29 NOTE — Telephone Encounter (Signed)
Patient is requesting a refill on her Norco, her husband has an appointment this morning and she gave OK for him to pick this up

## 2013-05-29 NOTE — Telephone Encounter (Signed)
I saw pt's husband for o/v today and I gave him Richard's rx. I told him to tell her that I need to see her for routine f/u/med check sometime in the next 3 mo (she has no insurance now, so has not followed up in about 10 mo).

## 2013-05-29 NOTE — Telephone Encounter (Signed)
Pt last seen 08/12/12.  No upcoming appointment.  Last Rx was printed 12/16/12 # 90.  Please advise refill.

## 2013-06-30 ENCOUNTER — Other Ambulatory Visit: Payer: Self-pay | Admitting: Family Medicine

## 2013-07-16 ENCOUNTER — Ambulatory Visit: Payer: 59 | Admitting: Family Medicine

## 2013-07-17 ENCOUNTER — Ambulatory Visit (INDEPENDENT_AMBULATORY_CARE_PROVIDER_SITE_OTHER): Payer: BC Managed Care – PPO | Admitting: Family Medicine

## 2013-07-17 ENCOUNTER — Encounter: Payer: Self-pay | Admitting: Family Medicine

## 2013-07-17 VITALS — BP 112/73 | HR 68 | Temp 97.4°F | Resp 18 | Ht 63.5 in | Wt 171.0 lb

## 2013-07-17 DIAGNOSIS — F411 Generalized anxiety disorder: Secondary | ICD-10-CM

## 2013-07-17 DIAGNOSIS — Z8669 Personal history of other diseases of the nervous system and sense organs: Secondary | ICD-10-CM

## 2013-07-17 DIAGNOSIS — G8929 Other chronic pain: Secondary | ICD-10-CM

## 2013-07-17 DIAGNOSIS — M549 Dorsalgia, unspecified: Secondary | ICD-10-CM

## 2013-07-17 MED ORDER — HYDROCODONE-ACETAMINOPHEN 5-325 MG PO TABS: 1.0000 | ORAL_TABLET | Freq: Four times a day (QID) | ORAL | Status: DC | PRN

## 2013-07-17 MED ORDER — ALPRAZOLAM 0.5 MG PO TABS: ORAL_TABLET | ORAL | Status: DC

## 2013-07-17 NOTE — Progress Notes (Signed)
OFFICE NOTE  07/17/2013  CC:  Chief Complaint  Jacqueline Vance presents with  . Follow-up  . Medication Refill     HPI: Jacqueline Vance is [name] who is here for f/u anxiety/depression, migraine HAs. 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 Doing well, new job less stressful.  HA's much less frequent. Exercise/diet lately has led to wt loss.  Takes vicodin at least once per day, sometimes two per day. Her LBP waxes and wanes.  She is careful to try to not take too much med.  Regarding anxiety: takes cital 40mg  qd, 1/2 0.5mg  tab twice a day.  No adverse side effects. Her son is apparently not doing well--has no drive, smokes marijuana, unruly/argumentative behavior.  Pertinent PMH:  Past Medical History  Diagnosis Date  . Migraine     Proph tx with topamax helps, abortive tx with relpax helps  . Kidney stones 2005  . Chronic back pain     neck, too  . Anxiety   . Depression    Past Surgical History  Procedure Laterality Date  . Laparoscopic vaginal hysterectomy  12/02/07    for DUB  . Breast surgery  1999    reduction  . Kidney stone surgery  2005  . Endometrial ablation  2002    MEDS: No longer taking wellbutrin, parafon, or gabapentin listed below Outpatient Prescriptions Prior to Visit  Medication Sig Dispense Refill  . ALPRAZolam (XANAX) 0.5 MG tablet TAKE ONE OR TWO TABLETS EVERY 8 HOURS ASNEEDED  90 tablet  1  . celecoxib (CELEBREX) 200 MG capsule Take 1 capsule (200 mg total) by mouth daily.  30 capsule  5  . citalopram (CELEXA) 40 MG tablet TAKE ONE TABLET ONCE DAILY  90 tablet  0  . Cyanocobalamin (VITAMIN B-12 PO) Take 1 tablet by mouth daily.      Marland Kitchen eletriptan (RELPAX) 40 MG tablet Must have appointment for more refills.  30 tablet  2  . HYDROcodone-acetaminophen (NORCO/VICODIN) 5-325 MG per tablet Take 1 tablet by mouth every 6 (six) hours as needed.  90 tablet  0  . pantoprazole (PROTONIX) 40 MG tablet TAKE  ONE (1) TABLET EACH DAY  90 tablet  1  . buPROPion (WELLBUTRIN XL) 150 MG 24 hr tablet Take 1 tablet (150 mg total) by mouth daily.  90 tablet  1  . chlorzoxazone (PARAFON) 500 MG tablet Take 1 tablet (500 mg total) by mouth 3 (three) times daily as needed.  180 tablet  0  . Cholecalciferol (VITAMIN D PO) Take 1 tablet by mouth daily.      Marland Kitchen gabapentin (NEURONTIN) 300 MG capsule       . mupirocin ointment (BACTROBAN) 2 % Apply topically 3 (three) times daily.  15 g  0   No facility-administered medications prior to visit.    PE: Blood pressure 112/73, pulse 68, temperature 97.4 F (36.3 C), temperature source Temporal, resp. rate 18, height 5' 3.5" (1.613 m), weight 171 lb (77.565 kg), SpO2 97.00%. Gen: Alert, well appearing.  Jacqueline Vance is oriented to person, place, time, and situation. XTK:WIOX: no injection, icteris, swelling, or exudate.  EOMI, PERRLA. Mouth: lips without lesion/swelling.  Oral mucosa pink and moist. Oropharynx without erythema, exudate, or swelling.  CV: RRR, no m/r/g.   LUNGS: CTA bilat, nonlabored resps, good aeration in all lung fields.   IMPRESSION AND PLAN:  1) Chronic LB pain with scoliosis, musculoskeletal--functioning well on daily celebrex  and 1-2 vicodin per day. Controlled substance contract reviewed with Jacqueline Vance today.  Jacqueline Vance signed this and it will be placed in the chart.   I printed rx's for Vicodin 5/325, 1 tab qid prn, #90 today for fill today and another to fill on/after 08/30/13.  Appropriate fill on/after date was noted on each rx.  2) Chronic anxiety: The current medical regimen is effective;  continue present plan and medications. Xanax 1/2-1 of the 0.5mg  tabs bid, #60, RF x 5--handed to pt today.  3) Migraine HA's: doing well now that she's not in a stressful work environment. Continue prn triptan.  An After Visit Summary was printed and given to the Jacqueline Vance.  FOLLOW UP: 6mo

## 2013-07-17 NOTE — Progress Notes (Signed)
Pre visit review using our clinic review tool, if applicable. No additional management support is needed unless otherwise documented below in the visit note. 

## 2013-07-30 ENCOUNTER — Ambulatory Visit (INDEPENDENT_AMBULATORY_CARE_PROVIDER_SITE_OTHER): Payer: BC Managed Care – PPO | Admitting: Family Medicine

## 2013-07-30 ENCOUNTER — Encounter: Payer: Self-pay | Admitting: Family Medicine

## 2013-07-30 ENCOUNTER — Ambulatory Visit
Admission: RE | Admit: 2013-07-30 | Discharge: 2013-07-30 | Disposition: A | Discharge status: Home / Self Care | Payer: BC Managed Care – PPO | Source: Ambulatory Visit

## 2013-07-30 ENCOUNTER — Other Ambulatory Visit: Payer: Self-pay

## 2013-07-30 VITALS — BP 125/83 | HR 64 | Temp 98.1°F | Resp 18 | Ht 63.5 in | Wt 171.0 lb

## 2013-07-30 DIAGNOSIS — Z1231 Encounter for screening mammogram for malignant neoplasm of breast: Secondary | ICD-10-CM

## 2013-07-30 DIAGNOSIS — F909 Attention-deficit hyperactivity disorder, unspecified type: Secondary | ICD-10-CM

## 2013-07-30 MED ORDER — LISDEXAMFETAMINE DIMESYLATE 20 MG PO CAPS: ORAL_CAPSULE | ORAL | Status: DC

## 2013-07-30 NOTE — Assessment & Plan Note (Signed)
We'll get her started back on vyvanse 20mg  qd, may increase to 2 of the 20mg  caps qAM in 7d if not feeling sufficient improvement.  Hold at 40mg  as max dose until I re-evaluate her in 1 month.  Therapeutic expectations and side effect profile of medication discussed today.  Patient's questions answered.

## 2013-07-30 NOTE — Progress Notes (Signed)
OFFICE NOTE  07/30/2013  CC:  Chief Complaint  Patient presents with  . focus issue   HPI: Patient is [name] who is here for problems with focus.  Describes "long history of problems focusing". Coworkers noticing/mentioning it the last month.  Has trouble getting distracted when trying to do complex tasks or multitask, has trouble with things like finishing things that she has started, like cleaning house. Has been on vyvanse in the past--most recently about 10 yrs ago per her recollection, but she got off of this eventually because insurance didn't cover it. This inattentiveness/distractibility causes problems with her work and social functioning.  Pertinent PMH:  Past Medical History  Diagnosis Date  . Migraine     Proph tx with topamax helps, abortive tx with relpax helps  . Kidney stones 2005  . Chronic back pain     neck, too  . Anxiety   . Depression    Past Surgical History  Procedure Laterality Date  . Laparoscopic vaginal hysterectomy  12/02/07    for DUB  . Breast surgery  1999    reduction  . Kidney stone surgery  2005  . Endometrial ablation  2002    MEDS:  Outpatient Prescriptions Prior to Visit  Medication Sig Dispense Refill  . ALPRAZolam (XANAX) 0.5 MG tablet 1/2-1 tab po bid prn anxiety  60 tablet  5  . celecoxib (CELEBREX) 200 MG capsule Take 1 capsule (200 mg total) by mouth daily.  30 capsule  5  . Cholecalciferol (VITAMIN D PO) Take 1 tablet by mouth daily.      . citalopram (CELEXA) 40 MG tablet TAKE ONE TABLET ONCE DAILY  90 tablet  0  . Cyanocobalamin (VITAMIN B-12 PO) Take 1 tablet by mouth daily.      Marland Kitchen eletriptan (RELPAX) 40 MG tablet Must have appointment for more refills.  30 tablet  2  . HYDROcodone-acetaminophen (NORCO/VICODIN) 5-325 MG per tablet Take 1 tablet by mouth every 6 (six) hours as needed.  90 tablet  0  . pantoprazole (PROTONIX) 40 MG tablet TAKE ONE (1) TABLET EACH DAY  90 tablet  1   No  facility-administered medications prior to visit.    PE: Blood pressure 125/83, pulse 64, temperature 98.1 F (36.7 C), temperature source Temporal, resp. rate 18, height 5' 3.5" (1.613 m), weight 171 lb (77.565 kg), SpO2 97.00%. Wt Readings from Last 2 Encounters:  07/30/13 171 lb (77.565 kg)  07/17/13 171 lb (77.565 kg)   Gen: alert, oriented x 4, affect pleasant.  Lucid thinking and conversation noted. HEENT: PERRLA, EOMI.   Neck: no LAD, mass, or thyromegaly. CV: RRR, no m/r/g LUNGS: CTA bilat, nonlabored. NEURO: no tremor or tics noted on observation.  Coordination intact. CN 2-12 grossly intact bilaterally, strength 5/5 in all extremeties.  No ataxia.   IMPRESSION AND PLAN:  Adult ADHD We'll get her started back on vyvanse 20mg  qd, may increase to 2 of the 20mg  caps qAM in 7d if not feeling sufficient improvement.  Hold at 40mg  as max dose until I re-evaluate her in 1 month.  Therapeutic expectations and side effect profile of medication discussed today.  Patient's questions answered.    An After Visit Summary was printed and given to the patient.  FOLLOW UP: 76mo

## 2013-07-30 NOTE — Progress Notes (Signed)
Pre visit review using our clinic review tool, if applicable. No additional management support is needed unless otherwise documented below in the visit note. 

## 2013-07-31 ENCOUNTER — Ambulatory Visit: Payer: BC Managed Care – PPO | Admitting: Family Medicine

## 2013-08-13 ENCOUNTER — Other Ambulatory Visit: Payer: Self-pay | Admitting: Family Medicine

## 2013-08-20 ENCOUNTER — Ambulatory Visit (INDEPENDENT_AMBULATORY_CARE_PROVIDER_SITE_OTHER): Payer: BC Managed Care – PPO | Admitting: Family Medicine

## 2013-08-20 ENCOUNTER — Encounter: Payer: Self-pay | Admitting: Family Medicine

## 2013-08-20 VITALS — BP 127/85 | HR 78 | Temp 98.0°F | Resp 18 | Ht 63.5 in | Wt 179.0 lb

## 2013-08-20 DIAGNOSIS — W57XXXA Bitten or stung by nonvenomous insect and other nonvenomous arthropods, initial encounter: Secondary | ICD-10-CM

## 2013-08-20 MED ORDER — LISDEXAMFETAMINE DIMESYLATE 40 MG PO CAPS
40.0000 mg | ORAL_CAPSULE | ORAL | Status: DC
Start: 2013-08-20 — End: 2013-08-20

## 2013-08-20 MED ORDER — LISDEXAMFETAMINE DIMESYLATE 40 MG PO CAPS: 40.0000 mg | ORAL_CAPSULE | ORAL | Status: DC

## 2013-08-20 MED ORDER — METHYLPREDNISOLONE ACETATE 40 MG/ML IJ SUSP
40.0000 mg | Freq: Once | INTRAMUSCULAR | Status: AC
  Administered 2013-08-20: 40 mg via INTRA_ARTICULAR

## 2013-08-20 NOTE — Patient Instructions (Signed)
Buy OTC generic allegra 180mg  and take one tab once a day to help with itching.

## 2013-08-20 NOTE — Progress Notes (Signed)
Pre visit review using our clinic review tool, if applicable. No additional management support is needed unless otherwise documented below in the visit note. 

## 2013-08-20 NOTE — Progress Notes (Signed)
OFFICE NOTE  08/20/2013  CC:  Chief Complaint  Patient presents with  . Tick Removal    rash, pt pulled off multiple ticks     HPI: Patient is [name] who is here for 3 wk f/u adult ADD, recently restarted vyvanse at 20mg  qd and titrated to 40mg  qd.  She says the 40 mg qd dose is working well for concentration/focus.  Feels less overwhelmed with tasks, better organized---feeling overall really great. Some appetite suppression noted but no wt loss.  No sleep impairment.  Lots of insect bites all over upper legs>lower legs.feet x 3 wks and not getting better.  She said her husband picked a bunch of seed ticks off of her.Marland Kitchen  Applying benadryl cream, calamine. Not taking oral antihistamine.  Pertinent PMH:  Past medical, surgical, social, and family history reviewed and no changes are noted since last office visit.  MEDS:  Outpatient Prescriptions Prior to Visit  Medication Sig Dispense Refill  . ALPRAZolam (XANAX) 0.5 MG tablet 1/2-1 tab po bid prn anxiety  60 tablet  5  . celecoxib (CELEBREX) 200 MG capsule Take 1 capsule (200 mg total) by mouth daily.  30 capsule  5  . Cholecalciferol (VITAMIN D PO) Take 1 tablet by mouth daily.      . citalopram (CELEXA) 40 MG tablet TAKE ONE TABLET ONCE DAILY  90 tablet  0  . Cyanocobalamin (VITAMIN B-12 PO) Take 1 tablet by mouth daily.      Marland Kitchen eletriptan (RELPAX) 40 MG tablet Must have appointment for more refills.  30 tablet  2  . HYDROcodone-acetaminophen (NORCO/VICODIN) 5-325 MG per tablet Take 1 tablet by mouth every 6 (six) hours as needed.  90 tablet  0  . lisdexamfetamine (VYVANSE) 20 MG capsule 1 cap po qAM x 7d, then increase to 2 caps po qAM  60 capsule  0  . pantoprazole (PROTONIX) 40 MG tablet TAKE ONE (1) TABLET EACH DAY  90 tablet  1  . RELPAX 40 MG tablet TAKE UP TO ONCE DAILY AS NEEDED  30 tablet  6   No facility-administered medications prior to visit.    PE: Blood pressure 127/85, pulse 78, temperature 98 F  (36.7 C), temperature source Temporal, resp. rate 18, height 5' 3.5" (1.613 m), weight 179 lb (81.194 kg), SpO2 96.00%. Wt Readings from Last 2 Encounters:  08/20/13 179 lb (81.194 kg)  07/30/13 171 lb (77.565 kg)    Gen: alert, oriented x 4, affect pleasant.  Lucid thinking and conversation noted. HEENT: PERRLA, EOMI.   Neck: no LAD, mass, or thyromegaly. CV: RRR, no m/r/g LUNGS: CTA bilat, nonlabored. NEURO: no tremor or tics noted on observation.  Coordination intact. CN 2-12 grossly intact bilaterally, strength 5/5 in all extremeties.  No ataxia. SKIN: multiple pink papules with some of the tops excoriated.  No tender lesions, no target lesions or other rashes.  These are located mainly on thighs, some on LL's, some on tops of feet.   IMPRESSION AND PLAN:  1) Adult ADD: doing well on vyvanse 40mg  qd. I printed rx's for vyvanse 40mg  qd, #30 today for this month, Sept, and Oct 2015.  Appropriate fill on/after date was noted on each rx.  2) Multiple insect/tick bites: depo medrol 40mg  IM in office today. Take allegra 180mg  qd.  An After Visit Summary was printed and given to the patient.  FOLLOW UP: 3 mo

## 2013-08-27 ENCOUNTER — Ambulatory Visit: Payer: BC Managed Care – PPO | Admitting: Family Medicine

## 2013-09-25 ENCOUNTER — Telehealth: Payer: Self-pay | Admitting: Family Medicine

## 2013-09-25 NOTE — Telephone Encounter (Signed)
Patient's husband can pick up Rx on Monday.

## 2013-09-26 NOTE — Telephone Encounter (Signed)
Refill for Hydrocodone

## 2013-09-27 MED ORDER — HYDROCODONE-ACETAMINOPHEN 5-325 MG PO TABS: 1.0000 | ORAL_TABLET | Freq: Four times a day (QID) | ORAL | Status: DC | PRN

## 2013-09-27 NOTE — Telephone Encounter (Signed)
Rx for vicodin printed.

## 2013-09-29 NOTE — Telephone Encounter (Signed)
Jacqueline Vance spoke with pt, advised Rx is ready to pick up

## 2013-10-31 ENCOUNTER — Other Ambulatory Visit: Payer: Self-pay | Admitting: Family Medicine

## 2013-10-31 ENCOUNTER — Other Ambulatory Visit: Payer: Self-pay | Admitting: *Deleted

## 2013-10-31 ENCOUNTER — Telehealth: Payer: Self-pay | Admitting: *Deleted

## 2013-10-31 MED ORDER — HYDROCODONE-ACETAMINOPHEN 5-325 MG PO TABS: 1.0000 | ORAL_TABLET | Freq: Four times a day (QID) | ORAL | Status: DC | PRN

## 2013-10-31 NOTE — Telephone Encounter (Signed)
error 

## 2013-10-31 NOTE — Telephone Encounter (Signed)
Refill request for Hydro/Ace (Norco) Last filled by MD on- 09/27/13 #90 x0 Last Appt: 08/20/2013 Next Appt: 11/19/2013 Please advise refill?  Patient was also requesting refill for Xanax, however it looks like she has enough refills til January.

## 2013-11-06 ENCOUNTER — Telehealth: Payer: Self-pay

## 2013-11-06 NOTE — Telephone Encounter (Signed)
Pt called requesting a medication for her migraines. She wants to try something stronger and was told Midrin works. She has an appt on Nov 4 and would like something until then. Please advise.

## 2013-11-08 MED ORDER — ISOMETHEPTENE-APAP-DICHLORAL 65-325-100 MG PO CAPS: ORAL_CAPSULE | ORAL | Status: DC

## 2013-11-08 NOTE — Telephone Encounter (Signed)
OK.   Midrin rx printed. She must stop relpax (eletriptan) while taking midrin. -thx

## 2013-11-10 NOTE — Telephone Encounter (Signed)
Left detailed message on pt's vm

## 2013-11-17 ENCOUNTER — Encounter: Payer: Self-pay | Admitting: Family Medicine

## 2013-11-19 ENCOUNTER — Ambulatory Visit: Payer: BC Managed Care – PPO | Admitting: Family Medicine

## 2013-11-21 ENCOUNTER — Encounter: Payer: Self-pay | Admitting: Family Medicine

## 2013-11-21 ENCOUNTER — Ambulatory Visit (INDEPENDENT_AMBULATORY_CARE_PROVIDER_SITE_OTHER): Payer: BC Managed Care – PPO | Admitting: Family Medicine

## 2013-11-21 VITALS — BP 122/84 | HR 76 | Temp 97.6°F | Resp 18 | Ht 63.5 in | Wt 169.0 lb

## 2013-11-21 DIAGNOSIS — F32A Depression, unspecified: Secondary | ICD-10-CM

## 2013-11-21 DIAGNOSIS — Z1211 Encounter for screening for malignant neoplasm of colon: Secondary | ICD-10-CM

## 2013-11-21 DIAGNOSIS — F411 Generalized anxiety disorder: Secondary | ICD-10-CM

## 2013-11-21 DIAGNOSIS — F329 Major depressive disorder, single episode, unspecified: Secondary | ICD-10-CM

## 2013-11-21 DIAGNOSIS — F900 Attention-deficit hyperactivity disorder, predominantly inattentive type: Secondary | ICD-10-CM

## 2013-11-21 DIAGNOSIS — Z23 Encounter for immunization: Secondary | ICD-10-CM

## 2013-11-21 DIAGNOSIS — F909 Attention-deficit hyperactivity disorder, unspecified type: Secondary | ICD-10-CM

## 2013-11-21 MED ORDER — LISDEXAMFETAMINE DIMESYLATE 50 MG PO CAPS: 50.0000 mg | ORAL_CAPSULE | Freq: Every day | ORAL | Status: DC

## 2013-11-21 MED ORDER — ALPRAZOLAM 1 MG PO TABS: ORAL_TABLET | ORAL | Status: DC

## 2013-11-21 NOTE — Progress Notes (Signed)
Pre visit review using our clinic review tool, if applicable. No additional management support is needed unless otherwise documented below in the visit note. 

## 2013-11-21 NOTE — Progress Notes (Signed)
OFFICE NOTE  11/21/2013  CC:  Chief Complaint  Patient presents with  . Follow-up   HPI: Patient is [name] who is here for 3 mo f/u adult ADD. Vyvanse lasting 8 am to 4 pm, then "crashes".  However, taking xanax 1/2-1 of the 0.5mg  tabs bid regularly the last few weeks due to excessive work stress (which she says is now improved/taken care of---an irritable boss, etc) and this med has been adding to her apathy/depressed mood in the evenings.  Had more HA's when stress at work was high, this is better now. She is ready to get referral for GI to get colon cancer screening.  Pertinent PMH:  Past medical, surgical, social, and family history reviewed and no changes are noted since last office visit.  MEDS:  Outpatient Prescriptions Prior to Visit  Medication Sig Dispense Refill  . ALPRAZolam (XANAX) 0.5 MG tablet 1/2-1 tab po bid prn anxiety 60 tablet 5  . celecoxib (CELEBREX) 200 MG capsule Take 1 capsule (200 mg total) by mouth daily. 30 capsule 5  . citalopram (CELEXA) 40 MG tablet TAKE ONE (1) TABLET EACH DAY 90 tablet 1  . Cyanocobalamin (VITAMIN B-12 PO) Take 1 tablet by mouth daily.    Marland Kitchen eletriptan (RELPAX) 40 MG tablet Must have appointment for more refills. 30 tablet 2  . HYDROcodone-acetaminophen (NORCO/VICODIN) 5-325 MG per tablet Take 1 tablet by mouth every 6 (six) hours as needed. 90 tablet 0  . lisdexamfetamine (VYVANSE) 40 MG capsule Take 1 capsule (40 mg total) by mouth every morning. 30 capsule 0  . pantoprazole (PROTONIX) 40 MG tablet TAKE ONE (1) TABLET EACH DAY 90 tablet 1  . Cholecalciferol (VITAMIN D PO) Take 1 tablet by mouth daily.    Marland Kitchen isometheptene-acetaminophen-dichloralphenazone (MIDRIN) 65-325-100 MG capsule 1 cap q 1 hr prn until HA is relieved, maximum 5 caps/12 hours. 30 capsule 1  . RELPAX 40 MG tablet TAKE UP TO ONCE DAILY AS NEEDED 30 tablet 6   No facility-administered medications prior to visit.    PE: Blood pressure 122/84,  pulse 76, temperature 97.6 F (36.4 C), temperature source Temporal, resp. rate 18, height 5' 3.5" (1.613 m), weight 169 lb (76.658 kg), SpO2 99 %. Wt Readings from Last 2 Encounters:  11/21/13 169 lb (76.658 kg)  08/20/13 179 lb (81.194 kg)   Gen: alert, oriented x 4, affect pleasant.  Lucid thinking and conversation noted. HEENT: PERRLA, EOMI.   Neck: no LAD, mass, or thyromegaly. CV: RRR, no m/r/g LUNGS: CTA bilat, nonlabored. NEURO: no tremor or tics noted on observation.  Coordination intact. CN 2-12 grossly intact bilaterally, strength 5/5 in all extremeties.  No ataxia.   IMPRESSION AND PLAN:  1) Adult ADD/anxiety/depression; good response to vyvanse but seems to only be lasting 8 hours.  I think her use of xanax more regularly lately is confusing the way she feels in the afternoon.  We'll go ahead and increase her to 50mg  vyvanse qd ( I printed rx's for vyvanse 50mg , 1 qd, #30 today for this month, December 2015, and January 2016.  Appropriate fill on/after date was noted on each rx) AND have her slowly decrease her xanax dosing over time b/c I think this is causing apathy/depression.  I would like her to reserve this for extremes of anxiety at a max of once daily, preferably in evenings. Continue citalopram qd.    2) Colon cancer screening: pt has insurance now and is ready and willing to proceed with  GI referral to get screening colonoscopy.  Ordered referral today.  Flu vaccine IM today.  An After Visit Summary was printed and given to the patient.  FOLLOW UP: 3 mo

## 2013-12-10 ENCOUNTER — Other Ambulatory Visit: Payer: Self-pay | Admitting: Family Medicine

## 2013-12-10 MED ORDER — HYDROCODONE-ACETAMINOPHEN 5-325 MG PO TABS: 1.0000 | ORAL_TABLET | Freq: Four times a day (QID) | ORAL | Status: DC | PRN

## 2013-12-10 NOTE — Telephone Encounter (Signed)
Noted. Agree.

## 2013-12-10 NOTE — Telephone Encounter (Signed)
Pt requesting rf of hydrocodone.  Patient last OV was 11/21/13.  Last RX printed 10/31/13.    Please advise.

## 2013-12-10 NOTE — Telephone Encounter (Signed)
Per Dr.  Anitra Lauth, okay to print.  He signed and I put at front desk.  Pt aware.

## 2013-12-27 ENCOUNTER — Other Ambulatory Visit: Payer: Self-pay | Admitting: Family Medicine

## 2014-01-30 ENCOUNTER — Telehealth: Payer: Self-pay

## 2014-01-30 NOTE — Telephone Encounter (Signed)
Pt requesting a refill on her Vicodin. Last Rx written 12/11/2014.

## 2014-01-30 NOTE — Telephone Encounter (Signed)
Last office visit 11/21/2013

## 2014-01-31 MED ORDER — HYDROCODONE-ACETAMINOPHEN 5-325 MG PO TABS: 1.0000 | ORAL_TABLET | Freq: Four times a day (QID) | ORAL | Status: DC | PRN

## 2014-01-31 NOTE — Telephone Encounter (Signed)
Vicodin rx printed. 

## 2014-02-02 NOTE — Telephone Encounter (Signed)
Called pt to advise her Rx was ready to pick up. I was unable to leave voicemail.

## 2014-02-05 ENCOUNTER — Telehealth: Payer: Self-pay | Admitting: Family Medicine

## 2014-02-05 NOTE — Telephone Encounter (Signed)
Please advise 

## 2014-02-05 NOTE — Telephone Encounter (Signed)
Patient states when she went to pick up Rx for Vivanse, Rx $285 with coupon $189. The pharmacist said the generic for Adderral might be cheaper. The Relpax was $389. Patient was able to find a substitute in her drug book Sumatriptan Succinate. Patient is willing to come in for an appointment if you need her to. Please disregard the VM she left on Lisa's phone.

## 2014-02-09 MED ORDER — AMPHETAMINE-DEXTROAMPHETAMINE 15 MG PO TABS: ORAL_TABLET | ORAL | Status: DC

## 2014-02-09 MED ORDER — SUMATRIPTAN SUCCINATE 100 MG PO TABS: ORAL_TABLET | ORAL | Status: DC

## 2014-02-09 NOTE — Telephone Encounter (Signed)
Patient agreed to try adderall. Patient requests that script be ready for pick-up today.

## 2014-02-09 NOTE — Telephone Encounter (Signed)
Patient notified

## 2014-02-09 NOTE — Telephone Encounter (Signed)
Adderall rx printed.

## 2014-02-09 NOTE — Telephone Encounter (Signed)
Pls notify pt that the generic adderall is only cheap if you buy the short acting form, and she would have to take this 2-3 times a day to get the same duration of effect that the vyvanse gives her.  That's fine with me if she is ok with it.  She may want to look through her insurance's formulary in the category of "stimulants" or ADHD medications and see if they have a "preferred" med.  If she finds one she can let me know the name of it and I will let her know if it would be a good substitute for the vyvanse.--Let me know what she says. As far as the migraine med, I'll send in new rx for the sumatriptan.  -thx

## 2014-02-24 ENCOUNTER — Ambulatory Visit: Payer: BC Managed Care – PPO | Admitting: Family Medicine

## 2014-03-14 ENCOUNTER — Other Ambulatory Visit: Payer: Self-pay | Admitting: Family Medicine

## 2014-03-16 NOTE — Telephone Encounter (Signed)
Spoke to pharmacist at [facility].  Pt never turned in Rx for 11/21/13.  LMOM for pt to CB.

## 2014-03-25 ENCOUNTER — Ambulatory Visit: Payer: Self-pay | Admitting: Family Medicine

## 2014-03-27 ENCOUNTER — Ambulatory Visit: Payer: Self-pay | Admitting: Nurse Practitioner

## 2014-04-01 ENCOUNTER — Ambulatory Visit: Payer: Self-pay | Admitting: Family Medicine

## 2014-04-03 ENCOUNTER — Ambulatory Visit: Payer: Self-pay | Admitting: Family Medicine

## 2014-04-06 ENCOUNTER — Ambulatory Visit: Payer: Self-pay | Admitting: Family Medicine

## 2014-04-07 ENCOUNTER — Ambulatory Visit: Payer: Self-pay | Admitting: Family Medicine

## 2014-04-13 ENCOUNTER — Ambulatory Visit (INDEPENDENT_AMBULATORY_CARE_PROVIDER_SITE_OTHER): Payer: BLUE CROSS/BLUE SHIELD | Admitting: Family Medicine

## 2014-04-13 ENCOUNTER — Encounter: Payer: Self-pay | Admitting: Family Medicine

## 2014-04-13 VITALS — BP 120/83 | HR 70 | Temp 97.1°F | Ht 63.5 in | Wt 172.0 lb

## 2014-04-13 DIAGNOSIS — M549 Dorsalgia, unspecified: Secondary | ICD-10-CM

## 2014-04-13 DIAGNOSIS — G8929 Other chronic pain: Secondary | ICD-10-CM

## 2014-04-13 DIAGNOSIS — F411 Generalized anxiety disorder: Secondary | ICD-10-CM | POA: Diagnosis not present

## 2014-04-13 DIAGNOSIS — F900 Attention-deficit hyperactivity disorder, predominantly inattentive type: Secondary | ICD-10-CM | POA: Diagnosis not present

## 2014-04-13 DIAGNOSIS — F909 Attention-deficit hyperactivity disorder, unspecified type: Secondary | ICD-10-CM

## 2014-04-13 MED ORDER — LISDEXAMFETAMINE DIMESYLATE 50 MG PO CAPS: 50.0000 mg | ORAL_CAPSULE | Freq: Every day | ORAL | Status: DC

## 2014-04-13 MED ORDER — ADAPALENE-BENZOYL PEROXIDE 0.1-2.5 % EX GEL: CUTANEOUS | Status: DC

## 2014-04-13 MED ORDER — HYDROCODONE-ACETAMINOPHEN 5-325 MG PO TABS: 1.0000 | ORAL_TABLET | Freq: Four times a day (QID) | ORAL | Status: DC | PRN

## 2014-04-13 NOTE — Progress Notes (Signed)
OFFICE NOTE  04/13/2014  CC:  Chief Complaint  Patient presents with  . Follow-up   HPI: Patient is [name] who is here for 4 mo f/u GAD and adult ADHD. Last visit I increased her vyvanse to 50mg  qd and emphasized the need to gradually cut back on xanax use (use prn SEVERE anxiety only).  However, for insurance reasons we had to put her on 15mg  Adderall tab at bid or tid dosing. This med makes her irritable, snappy, although it is significantly helpful with her ADD sx's.  She wants to switch back to vyvanse and pay out of pocket, at least for now. Taking xanax an average of once a day now.  Takes citalopram 40mg  qd and still feels like she benefits from this from the standpoint of mood stability and lowering chronic anxiety levels.  No adverse side effects felt from this med.  Migraines: responding still to imitrex but this med makes her feel "like I'm having a heart attack".  Says she still uses her vicodin 1-2 times per day at the most for her chronic low back pain. She does not take this med for her HA's.  Pertinent PMH:  Past medical, surgical, social, and family history reviewed and no changes are noted since last office visit.  MEDS:  Outpatient Prescriptions Prior to Visit  Medication Sig Dispense Refill  . ALPRAZolam (XANAX) 1 MG tablet 1 tab po qd prn 30 tablet 5  . amphetamine-dextroamphetamine (ADDERALL) 15 MG tablet 1 tab po tid (do not take 3rd dose later than 5pm) 90 tablet 0  . celecoxib (CELEBREX) 200 MG capsule Take 1 capsule (200 mg total) by mouth daily. 30 capsule 5  . citalopram (CELEXA) 40 MG tablet TAKE ONE (1) TABLET EACH DAY 90 tablet 1  . Cyanocobalamin (VITAMIN B-12 PO) Take 1 tablet by mouth daily.    Marland Kitchen HYDROcodone-acetaminophen (NORCO/VICODIN) 5-325 MG per tablet Take 1 tablet by mouth every 6 (six) hours as needed. 90 tablet 0  . pantoprazole (PROTONIX) 40 MG tablet TAKE ONE (1) TABLET EACH DAY 90 tablet 1  . SUMAtriptan (IMITREX) 100  MG tablet 1 tab at onset of HA, may repeat in 2 hrs if HA not 50% improved, max 200 mg in 24h 9 tablet 6  . Cholecalciferol (VITAMIN D PO) Take 1 tablet by mouth daily.     No facility-administered medications prior to visit.    PE: Blood pressure 120/83, pulse 70, temperature 97.1 F (36.2 C), temperature source Temporal, height 5' 3.5" (1.613 m), weight 172 lb (78.019 kg), SpO2 96 %. Wt Readings from Last 2 Encounters:  04/13/14 172 lb (78.019 kg)  11/21/13 169 lb (76.658 kg)    Gen: alert, oriented x 4, affect pleasant.  Lucid thinking and conversation noted. HEENT: PERRLA, EOMI.   Neck: no LAD, mass, or thyromegaly. CV: RRR, no m/r/g LUNGS: CTA bilat, nonlabored. NEURO: no tremor or tics noted on observation.  Coordination intact. CN 2-12 grossly intact bilaterally, strength 5/5 in all extremeties.  No ataxia.   IMPRESSION AND PLAN:  1) Adult ADD: responds well to stimulants but cost/lack of insurance coverage is an issue. She decided she wants to pay out of pocket for vyvanse rather than stay on the adderall multi-dosing regimen. I printed rx's for vyvanse 50mg  today for this month, April 2016, and May 2016.  Appropriate fill on/after date was noted on each rx.  2) GAD: The current medical regimen is effective;  continue present plan and  medications. Try to stick with once qd prn alprazolam dosing.  3) Chronic lumbar back pain: continue celebrex 200mg  qd and I printed rx for her norco 5/325, 1 tab q6h prn, #90. Therapeutic expectations and side effect profile of medication discussed today.  Patient's questions answered.  An After Visit Summary was printed and given to the patient.  FOLLOW UP: 76mo

## 2014-04-13 NOTE — Progress Notes (Signed)
Pre visit review using our clinic review tool, if applicable. No additional management support is needed unless otherwise documented below in the visit note. 

## 2014-04-30 ENCOUNTER — Other Ambulatory Visit: Payer: Self-pay | Admitting: Family Medicine

## 2014-04-30 NOTE — Telephone Encounter (Signed)
Pt requesting RF of alprazolam.  Last OV was 04/13/14. Last RX was 12/11/13 x 5 rfs.  Please advise.

## 2014-05-08 ENCOUNTER — Telehealth: Payer: Self-pay | Admitting: Family Medicine

## 2014-05-08 ENCOUNTER — Other Ambulatory Visit: Payer: Self-pay | Admitting: Family Medicine

## 2014-05-08 MED ORDER — AMPHETAMINE-DEXTROAMPHETAMINE 15 MG PO TABS: ORAL_TABLET | ORAL | Status: DC

## 2014-05-08 NOTE — Telephone Encounter (Signed)
OK. Does the pharmacy have her April and May vyvanse Rx's? Pls call pharmacy to check on this, not the patient. If pharmacy has them pls tell pharmacy to shred them b/c I am switching her to adderall. If pharmacy doesn't have them, she'll have to bring them in to give to [place] to shred when she comes and picks up her adderall.  I'll print adderall rx for one month at a time.-thx

## 2014-05-08 NOTE — Telephone Encounter (Signed)
Neither pharmacy has pt's Rx's.  Spoke to pt, she has both Rx's and she will bring them into office before receiving her new rx.

## 2014-05-08 NOTE — Telephone Encounter (Signed)
Pt LMOM stating that the vyvanse is just too expensive for her at this time.  Could you change Rx to adderall instead?  Please advise.

## 2014-05-29 ENCOUNTER — Other Ambulatory Visit: Payer: Self-pay | Admitting: *Deleted

## 2014-05-29 NOTE — Telephone Encounter (Signed)
Fax from [facility] requesting refill for Alprazolam 1mg  take 1 tab  po qd prn. LOV 04/13/14, last written 05/07/14 w/ 5RF. Please advised. Thanks.

## 2014-06-01 ENCOUNTER — Other Ambulatory Visit: Payer: Self-pay | Admitting: Family Medicine

## 2014-06-01 MED ORDER — ALPRAZOLAM 0.5 MG PO TABS: ORAL_TABLET | ORAL | Status: DC

## 2014-06-01 MED ORDER — HYDROCODONE-ACETAMINOPHEN 5-325 MG PO TABS: 1.0000 | ORAL_TABLET | Freq: Four times a day (QID) | ORAL | Status: DC | PRN

## 2014-06-01 NOTE — Telephone Encounter (Signed)
Pharmacy advised and Rx put up front for p/u.

## 2014-06-01 NOTE — Telephone Encounter (Signed)
Pls call pharmacy and let them know I authorize early RF of her xanax this month. Also, will print vicodin rx she requested.

## 2014-06-05 ENCOUNTER — Other Ambulatory Visit: Payer: Self-pay | Admitting: Family Medicine

## 2014-06-22 ENCOUNTER — Telehealth: Payer: Self-pay | Admitting: *Deleted

## 2014-06-22 NOTE — Telephone Encounter (Signed)
Pt LMOM requesting that we give override to fill her migraine medication early. She stated that she can not get it refilled until the 25th and has had a migraine for the past two weeks. Please advise. Thanks.

## 2014-06-22 NOTE — Telephone Encounter (Signed)
Can you get this process going?  I'll sign whatever I need to. If they don't approve over-ride, then she'll have to pay out of pocket for her med this time.

## 2014-06-22 NOTE — Telephone Encounter (Signed)
Left message for pt to call back. Need to know what pharmacy she has her Imitrex filled at and advised her of message below.

## 2014-07-01 NOTE — Telephone Encounter (Signed)
Left message for pt to call back  °

## 2014-07-08 ENCOUNTER — Other Ambulatory Visit: Payer: Self-pay | Admitting: *Deleted

## 2014-07-08 NOTE — Telephone Encounter (Signed)
Pt called requesting refill for 1) Adderall 05/08/14 #90 w/ 0RF, 2) Hydro/Apap 06/01/14 #90 w/ 0RF, and 3) Imitrex 02/09/14 #9 w/ 6RF. LOV: 04/13/14 NOV: 08/12/14 Pt is requesting a qty of #12 for the Imitrex she states that her insurance will cover a qty of #12. Pt was advised that Dr. Anitra Lauth was out of the office this week and will be back next week. Please advise. Thanks.

## 2014-07-09 MED ORDER — SUMATRIPTAN SUCCINATE 100 MG PO TABS: ORAL_TABLET | ORAL | Status: DC

## 2014-07-09 MED ORDER — HYDROCODONE-ACETAMINOPHEN 5-325 MG PO TABS: 1.0000 | ORAL_TABLET | Freq: Four times a day (QID) | ORAL | Status: DC | PRN

## 2014-07-09 MED ORDER — AMPHETAMINE-DEXTROAMPHETAMINE 15 MG PO TABS: ORAL_TABLET | ORAL | Status: DC

## 2014-07-31 ENCOUNTER — Ambulatory Visit: Payer: BLUE CROSS/BLUE SHIELD | Admitting: Family Medicine

## 2014-08-03 ENCOUNTER — Other Ambulatory Visit: Payer: Self-pay

## 2014-08-03 ENCOUNTER — Encounter: Payer: Self-pay | Admitting: Family Medicine

## 2014-08-03 ENCOUNTER — Ambulatory Visit: Payer: BLUE CROSS/BLUE SHIELD

## 2014-08-03 ENCOUNTER — Ambulatory Visit (INDEPENDENT_AMBULATORY_CARE_PROVIDER_SITE_OTHER): Payer: BLUE CROSS/BLUE SHIELD | Admitting: Family Medicine

## 2014-08-03 VITALS — BP 120/83 | HR 58 | Temp 97.8°F | Resp 16 | Ht 63.5 in | Wt 174.0 lb

## 2014-08-03 DIAGNOSIS — F32A Depression, unspecified: Secondary | ICD-10-CM

## 2014-08-03 DIAGNOSIS — M542 Cervicalgia: Secondary | ICD-10-CM

## 2014-08-03 DIAGNOSIS — G8929 Other chronic pain: Secondary | ICD-10-CM | POA: Insufficient documentation

## 2014-08-03 DIAGNOSIS — F329 Major depressive disorder, single episode, unspecified: Secondary | ICD-10-CM | POA: Insufficient documentation

## 2014-08-03 DIAGNOSIS — Z1231 Encounter for screening mammogram for malignant neoplasm of breast: Secondary | ICD-10-CM

## 2014-08-03 DIAGNOSIS — F418 Other specified anxiety disorders: Secondary | ICD-10-CM

## 2014-08-03 DIAGNOSIS — F419 Anxiety disorder, unspecified: Secondary | ICD-10-CM | POA: Insufficient documentation

## 2014-08-03 DIAGNOSIS — F900 Attention-deficit hyperactivity disorder, predominantly inattentive type: Secondary | ICD-10-CM | POA: Diagnosis not present

## 2014-08-03 DIAGNOSIS — G4489 Other headache syndrome: Secondary | ICD-10-CM

## 2014-08-03 DIAGNOSIS — M545 Low back pain: Secondary | ICD-10-CM

## 2014-08-03 DIAGNOSIS — F909 Attention-deficit hyperactivity disorder, unspecified type: Secondary | ICD-10-CM

## 2014-08-03 MED ORDER — SUMATRIPTAN SUCCINATE 100 MG PO TABS: ORAL_TABLET | ORAL | Status: DC

## 2014-08-03 MED ORDER — AMPHETAMINE-DEXTROAMPHETAMINE 15 MG PO TABS: ORAL_TABLET | ORAL | Status: DC

## 2014-08-03 MED ORDER — CELECOXIB 200 MG PO CAPS: ORAL_CAPSULE | ORAL | Status: DC

## 2014-08-03 MED ORDER — HYDROCODONE-ACETAMINOPHEN 5-325 MG PO TABS: 1.0000 | ORAL_TABLET | Freq: Four times a day (QID) | ORAL | Status: DC | PRN

## 2014-08-03 NOTE — Progress Notes (Signed)
Pre visit review using our clinic review tool, if applicable. No additional management support is needed unless otherwise documented below in the visit note. 

## 2014-08-03 NOTE — Progress Notes (Signed)
OFFICE VISIT  08/03/2014   CC:  Chief Complaint  Patient presents with  . Follow-up    4 month f/u, Pt is fasting.   HPI:    Patient is a 54 y.o. Caucasian female who presents for 4 mo f/u GAD and adult ADHD, as well as chronic lumbar back pain that I have been treating in good faith with chronic narcotic med in addition to her daily NSAID.    Doing well on pscy med regimen.  Her chronic tension/migrain HA's, the neck pain is the trigger and she has known DDD with scoliosis.  She used to go to Dr. Carloyn Manner or her MD at the [facility] in [place], needs to f/u with either of them and/or try a chiropracter, but for now since insurance will briefly be lapsing we'll stick with the current course of treatment.  Past Medical History  Diagnosis Date  . Migraine     Proph tx with topamax helps, abortive tx with relpax helps  . Kidney stones 2005  . Chronic back pain     neck, too  . Anxiety   . Depression   . Adult ADHD     Past Surgical History  Procedure Laterality Date  . Laparoscopic vaginal hysterectomy  12/02/07    for DUB  . Breast surgery  1999    reduction  . Kidney stone surgery  2005  . Endometrial ablation  2002    Outpatient Prescriptions Prior to Visit  Medication Sig Dispense Refill  . Adapalene-Benzoyl Peroxide 0.1-2.5 % gel Apply to affected areas qhs prn 45 g 11  . ALPRAZolam (XANAX) 0.5 MG tablet TAKE 1/2 TO 1 TABLET TWICE DAILY AS NEEDED FOR ANXIETY 60 tablet 5  . Cholecalciferol (VITAMIN D PO) Take 1 tablet by mouth daily.    . citalopram (CELEXA) 40 MG tablet TAKE ONE (1) TABLET EACH DAY 90 tablet 1  . Cyanocobalamin (VITAMIN B-12 PO) Take 1 tablet by mouth daily.    . pantoprazole (PROTONIX) 40 MG tablet TAKE ONE (1) TABLET EACH DAY 90 tablet 1  . amphetamine-dextroamphetamine (ADDERALL) 15 MG tablet 1 tab po tid (do not take 3rd dose later than 5pm) 90 tablet 0  . celecoxib (CELEBREX) 200 MG capsule Take 1 capsule (200 mg total) by mouth daily.  30 capsule 5  . HYDROcodone-acetaminophen (NORCO/VICODIN) 5-325 MG per tablet Take 1 tablet by mouth every 6 (six) hours as needed. 90 tablet 0  . SUMAtriptan (IMITREX) 100 MG tablet 1 tab at onset of HA, may repeat in 2 hrs if HA not 50% improved, max 200 mg in 24h 12 tablet 0   No facility-administered medications prior to visit.    No Known Allergies  ROS As per HPI  PE: Blood pressure 120/83, pulse 58, temperature 97.8 F (36.6 C), temperature source Oral, resp. rate 16, height 5' 3.5" (1.613 m), weight 174 lb (78.926 kg), SpO2 98 %. Wt Readings from Last 2 Encounters:  08/03/14 174 lb (78.926 kg)  04/13/14 172 lb (78.019 kg)    Gen: alert, oriented x 4, affect pleasant.  Lucid thinking and conversation noted. HEENT: PERRLA, EOMI.   Neck: no LAD, mass, or thyromegaly. CV: RRR, no m/r/g LUNGS: CTA bilat, nonlabored. NEURO: no tremor or tics noted on observation.  Coordination intact. CN 2-12 grossly intact bilaterally, strength 5/5 in all extremeties.  No ataxia.  LABS:  None  IMPRESSION AND PLAN:  1) Adult ADD, depression/anxiety:  Fairly stable. Continue all current meds. I printed  rx's for adderall 15mg , 1 tid, #90 today for this month, Aug 2016, and Sept 2016.  Appropriate fill on/after date was noted on each rx.  2) Chronic LBP and cervicalgia, contributing to chronic tension/migrain HA's: Continue celebrex 200 mg qd prn, vicodin 5/325 1 tab q6h prn, #90. Imitrex 100mg  3 mo supply printed today (#36).  An After Visit Summary was printed and given to the patient.  Spent 25 min with pt today, with >50% of this time spent in counseling and care coordination regarding the above problems.    FOLLOW UP: Return in about 4 months (around 12/04/2014) for annual CPE (fasting).

## 2014-08-04 ENCOUNTER — Telehealth: Payer: Self-pay | Admitting: *Deleted

## 2014-08-04 MED ORDER — ALPRAZOLAM 1 MG PO TABS: 1.0000 mg | ORAL_TABLET | Freq: Two times a day (BID) | ORAL | Status: DC | PRN

## 2014-08-04 NOTE — Telephone Encounter (Signed)
Pt called wanting to know if Dr. Anitra Lauth will send in a Rx for the 1mg  xanax. She states that she can half then and this will help her get by since they are going to lose their insurance in 2 weeks. Please advise. Thanks.

## 2014-08-04 NOTE — Telephone Encounter (Signed)
Rx printed

## 2014-08-04 NOTE — Telephone Encounter (Signed)
Rx faxed to pharmacy  

## 2014-08-12 ENCOUNTER — Ambulatory Visit: Payer: BLUE CROSS/BLUE SHIELD | Admitting: Family Medicine

## 2014-09-07 ENCOUNTER — Other Ambulatory Visit: Payer: Self-pay | Admitting: Family Medicine

## 2014-10-22 ENCOUNTER — Other Ambulatory Visit: Payer: Self-pay | Admitting: Family Medicine

## 2014-10-23 ENCOUNTER — Other Ambulatory Visit: Payer: Self-pay | Admitting: *Deleted

## 2014-10-23 NOTE — Telephone Encounter (Signed)
RF request for alprazolam LOV: 08/03/14 Next ov:  12/04/14 Last written: 08/04/14 #180 w/ 0RF Please advise. Thanks.

## 2014-10-23 NOTE — Telephone Encounter (Signed)
Pt LMOM on 10/23/14 at 9:17am  RF request for Hydrocodone LOV: 08/03/14 Next ov: 1118/16 Last written: 08/03/14 #90 w/ 0RF  Please advise. Thanks.

## 2014-10-26 ENCOUNTER — Telehealth: Payer: Self-pay | Admitting: Family Medicine

## 2014-10-26 MED ORDER — HYDROCODONE-ACETAMINOPHEN 5-325 MG PO TABS: 1.0000 | ORAL_TABLET | Freq: Four times a day (QID) | ORAL | Status: DC | PRN

## 2014-10-26 MED ORDER — ALPRAZOLAM 1 MG PO TABS: 1.0000 mg | ORAL_TABLET | Freq: Two times a day (BID) | ORAL | Status: DC | PRN

## 2014-10-26 NOTE — Telephone Encounter (Signed)
Pt called back today. (New message started by Salomon Fick, I saved it to chart).  RF request for alprazolam Last written: 08/04/14 #180 w/ 0RF

## 2014-10-26 NOTE — Telephone Encounter (Signed)
Attached request for alprazolam to request for hydrocodone. Will call pt once request has been responded to by Dr. Anitra Lauth.

## 2014-10-26 NOTE — Telephone Encounter (Signed)
Pt is calling to see status of her request fr hydrocodone. Pt also wants to check on the request Pharmacy sent for her xanax.

## 2014-10-27 ENCOUNTER — Telehealth: Payer: Self-pay | Admitting: Family Medicine

## 2014-10-27 NOTE — Telephone Encounter (Signed)
Pt advised Rx's ready for p/u.

## 2014-10-27 NOTE — Telephone Encounter (Signed)
Spoke to pharmacist, Rx for alprazolam was printed with Rx for Hydrocodone. Pt will need to p/u Rx because Hydrocodone can not be faxed.

## 2014-10-27 NOTE — Telephone Encounter (Signed)
[facility] called and said they tried to fill a xanax rx for Jacqueline Vance and it was denied. Can we please send a new rx.

## 2014-12-04 ENCOUNTER — Ambulatory Visit: Payer: BLUE CROSS/BLUE SHIELD | Admitting: Family Medicine

## 2014-12-08 ENCOUNTER — Other Ambulatory Visit: Payer: Self-pay | Admitting: *Deleted

## 2014-12-08 NOTE — Telephone Encounter (Signed)
Pt LMOM on 12/08/14 at 1:39pm requesting refill. She stated that she would like to p/u Rx today or tomorrow. I advised pt that Dr. Anitra Lauth is out of the office this afternoon but will be back tomorrow morning.   RF request for hydro/apap LOV: 08/03/14 Next ov: 12/28/14 Last written: 10/26/14 #90 w/ 1RF  Please advise. Thanks.

## 2014-12-09 MED ORDER — HYDROCODONE-ACETAMINOPHEN 5-325 MG PO TABS: 1.0000 | ORAL_TABLET | Freq: Four times a day (QID) | ORAL | Status: DC | PRN

## 2014-12-09 NOTE — Telephone Encounter (Signed)
Pt advised and voiced understanding.   

## 2014-12-28 ENCOUNTER — Encounter: Payer: BLUE CROSS/BLUE SHIELD | Admitting: Family Medicine

## 2014-12-31 ENCOUNTER — Other Ambulatory Visit: Payer: Self-pay | Admitting: Family Medicine

## 2015-01-01 ENCOUNTER — Other Ambulatory Visit: Payer: Self-pay | Admitting: *Deleted

## 2015-01-01 NOTE — Telephone Encounter (Signed)
RF request for citalopram LOV: 08/03/14 Next ov: None - has cancelled last 4 apts Last written: 06/05/14 #90 w/ 1RF  Rx sent for #90 w/ 0Rf. Pt needs ov for more refills. Left detailed message on cell vm, okay per DPR.

## 2015-01-01 NOTE — Telephone Encounter (Signed)
Noted, agree

## 2015-01-15 ENCOUNTER — Ambulatory Visit (INDEPENDENT_AMBULATORY_CARE_PROVIDER_SITE_OTHER): Payer: 59 | Admitting: Family Medicine

## 2015-01-15 ENCOUNTER — Encounter: Payer: Self-pay | Admitting: Family Medicine

## 2015-01-15 VITALS — BP 120/81 | HR 71 | Temp 97.8°F | Resp 16 | Ht 63.5 in | Wt 175.2 lb

## 2015-01-15 DIAGNOSIS — M545 Low back pain, unspecified: Secondary | ICD-10-CM

## 2015-01-15 DIAGNOSIS — M10071 Idiopathic gout, right ankle and foot: Secondary | ICD-10-CM | POA: Diagnosis not present

## 2015-01-15 DIAGNOSIS — Z23 Encounter for immunization: Secondary | ICD-10-CM | POA: Diagnosis not present

## 2015-01-15 DIAGNOSIS — G8929 Other chronic pain: Secondary | ICD-10-CM | POA: Diagnosis not present

## 2015-01-15 MED ORDER — COLCHICINE 0.6 MG PO TABS: ORAL_TABLET | ORAL | Status: DC

## 2015-01-15 MED ORDER — CYCLOBENZAPRINE HCL 5 MG PO TABS: 5.0000 mg | ORAL_TABLET | Freq: Three times a day (TID) | ORAL | Status: DC | PRN

## 2015-01-15 MED ORDER — HYDROCODONE-ACETAMINOPHEN 5-325 MG PO TABS: 1.0000 | ORAL_TABLET | Freq: Four times a day (QID) | ORAL | Status: DC | PRN

## 2015-01-15 NOTE — Progress Notes (Signed)
OFFICE VISIT  01/15/2015   CC:  Chief Complaint  Patient presents with  . Back Pain    x 3 weeks right side  . Foot Pain    right x 2 days   HPI:    Patient is a 54 y.o. Caucasian female who presents for one day of red, painful, and swollen 5th MTP joint on R foot.  Hx of podagra on same foot in remote past.  No recent dietary changes.  No f/c and no malaise.  Her low back has been hurting her a lot more the last 3 weeks, taking 1-2 pain pills every day.   Right lumbar region and R glut, w/out any further radiation down leg.  No tingling or numbness down leg.  Her back pain alters her gait.  No loss of bowel/bladder control.  No saddle anesthesia.  She made a f/u appt with her ortho MD, Dr. Rip Harbour for 01/19/15.   Past Medical History  Diagnosis Date  . Migraine     Proph tx with topamax helps, abortive tx with relpax helps  . Kidney stones 2005  . Chronic back pain     neck, too.  MRI L spine 05/2008: 40 degrees dextroscoliosis due to a hemivertebra of L3.  Some DDD/spondylosis w/out spinal stenosis.  . Anxiety   . Depression   . Adult ADHD     Past Surgical History  Procedure Laterality Date  . Laparoscopic vaginal hysterectomy  12/02/07    for DUB  . Breast surgery  1999    reduction  . Kidney stone surgery  2005  . Endometrial ablation  2002    Outpatient Prescriptions Prior to Visit  Medication Sig Dispense Refill  . ALPRAZolam (XANAX) 1 MG tablet Take 1 tablet (1 mg total) by mouth 2 (two) times daily as needed for anxiety. 180 tablet 0  . amphetamine-dextroamphetamine (ADDERALL) 15 MG tablet 1 tab po tid (do not take 3rd dose later than 5pm) 90 tablet 0  . celecoxib (CELEBREX) 200 MG capsule 1 tab po bid prn 60 capsule 5  . citalopram (CELEXA) 40 MG tablet TAKE ONE (1) TABLET EACH DAY 90 tablet 0  . Cyanocobalamin (VITAMIN B-12 PO) Take 1 tablet by mouth daily.    . pantoprazole (PROTONIX) 40 MG tablet TAKE ONE (1) TABLET EACH DAY 90 tablet 1  . SUMAtriptan  (IMITREX) 100 MG tablet 1 tab at onset of HA, may repeat in 2 hrs if HA not 50% improved, max 200 mg in 24h 36 tablet 3  . HYDROcodone-acetaminophen (NORCO/VICODIN) 5-325 MG tablet Take 1 tablet by mouth every 6 (six) hours as needed. 90 tablet 0  . Adapalene-Benzoyl Peroxide 0.1-2.5 % gel Apply to affected areas qhs prn (Patient not taking: Reported on 01/15/2015) 45 g 11  . Cholecalciferol (VITAMIN D PO) Take 1 tablet by mouth daily. Reported on 01/15/2015     No facility-administered medications prior to visit.    No Known Allergies  ROS As per HPI  PE: Blood pressure 120/81, pulse 71, temperature 97.8 F (36.6 C), temperature source Oral, resp. rate 16, height 5' 3.5" (1.613 m), weight 175 lb 4 oz (79.493 kg), SpO2 98 %.  Pt examined with Sharen Hones, CMA, as chaperone.  Gen: Alert, well appearing.  Patient is oriented to person, place, time, and situation. Right foot: 5th MTP joint with warmth, erythema, and mild swelling.  Significantly tender over this joint. Back: no upper or mid back TTP.  Some lumbar spine paraspinous mm tenderness,  facet region less tender. No tenderness of SI joints.  FABER maneuver elicits some pain in lumbar paraspinous mm's but not SI region or glut region.  No TTP of ischial tuberosity on either side.  Mild R gluteal muscle tenderness.  Hips ROM intact w/out pain.  LABS:  none  IMPRESSION AND PLAN:  1) Gouty arthritis, 5th MTP of R foot. This is only her second episode of gouty arthritis--1st one was in remote past.  She is not interested in possible preventative gout treatment at this time. Colchicine 0.6mg , 2 tabs now and 1 tab an hour later---rx 'd today.  2) Acute-on-chronic low back pain: musculoskeletal/muscle tension/spasm + DDD/spondylosis. Continue NSAID, RF'd vicodin 5/325, 1 q6h  Prn, #90. Added flexeril 5mg , 1 tid prn, #30, RF x 1. Pt to see Dr. Rip Harbour next week.  Flu vaccine given today.  An After Visit Summary was printed and  given to the patient.  FOLLOW UP: Return if symptoms worsen or fail to improve.

## 2015-01-15 NOTE — Progress Notes (Signed)
Pre visit review using our clinic review tool, if applicable. No additional management support is needed unless otherwise documented below in the visit note. 

## 2015-02-15 ENCOUNTER — Other Ambulatory Visit: Payer: Self-pay | Admitting: Family Medicine

## 2015-02-16 NOTE — Telephone Encounter (Signed)
RF request for alprazolam LOV: 01/15/15 Next ov: None Last written: 10/26/14 #180 w/ 0RF  Please advise. Thanks.

## 2015-02-16 NOTE — Telephone Encounter (Signed)
Rx faxed

## 2015-03-09 ENCOUNTER — Other Ambulatory Visit: Payer: Self-pay

## 2015-03-09 ENCOUNTER — Other Ambulatory Visit: Payer: Self-pay | Admitting: Family Medicine

## 2015-03-09 MED ORDER — HYDROCODONE-ACETAMINOPHEN 5-325 MG PO TABS: 1.0000 | ORAL_TABLET | Freq: Four times a day (QID) | ORAL | Status: DC | PRN

## 2015-03-09 NOTE — Telephone Encounter (Signed)
Pt called to request a refill of her Hydrocodone. Pended for you review.

## 2015-03-12 ENCOUNTER — Other Ambulatory Visit: Payer: Self-pay | Admitting: Family Medicine

## 2015-03-12 NOTE — Telephone Encounter (Signed)
LOV: 08/03/14 - due for CPE or f/u NOV: None  RF request for citalopram Last written: 01/01/15 #90 w/ 0RF - pt stated that she lost her Rx.   RF request for pantoprazole Last written: 09/08/14 #90 w/ 3RF - pt stated that she lost her Rx  Pt was advised that she needed apt to get more refills. Apt made for 03/30/15 at 8:00am.

## 2015-03-13 ENCOUNTER — Other Ambulatory Visit: Payer: Self-pay | Admitting: Family Medicine

## 2015-03-15 NOTE — Telephone Encounter (Signed)
RF request for adderall LOV: 08/03/14 Next ov: 03/30/15 Last written: 08/03/14 #90 w/ 0RF

## 2015-03-15 NOTE — Telephone Encounter (Signed)
Rx up front ready for p/u. Left message on cell vm, stating Rx is ready for p/u at front desk.

## 2015-03-30 ENCOUNTER — Ambulatory Visit: Payer: 59 | Admitting: Family Medicine

## 2015-03-31 ENCOUNTER — Ambulatory Visit: Payer: 59 | Admitting: Family Medicine

## 2015-04-07 ENCOUNTER — Encounter: Payer: Self-pay | Admitting: Family Medicine

## 2015-04-07 ENCOUNTER — Ambulatory Visit (INDEPENDENT_AMBULATORY_CARE_PROVIDER_SITE_OTHER): Payer: 59 | Admitting: Family Medicine

## 2015-04-07 VITALS — BP 141/97 | HR 67 | Temp 97.8°F | Resp 16 | Ht 63.5 in | Wt 179.0 lb

## 2015-04-07 DIAGNOSIS — F329 Major depressive disorder, single episode, unspecified: Secondary | ICD-10-CM

## 2015-04-07 DIAGNOSIS — F411 Generalized anxiety disorder: Secondary | ICD-10-CM | POA: Diagnosis not present

## 2015-04-07 DIAGNOSIS — F900 Attention-deficit hyperactivity disorder, predominantly inattentive type: Secondary | ICD-10-CM

## 2015-04-07 DIAGNOSIS — F32A Depression, unspecified: Secondary | ICD-10-CM

## 2015-04-07 DIAGNOSIS — M2550 Pain in unspecified joint: Secondary | ICD-10-CM

## 2015-04-07 DIAGNOSIS — F909 Attention-deficit hyperactivity disorder, unspecified type: Secondary | ICD-10-CM

## 2015-04-07 MED ORDER — HYDROCODONE-ACETAMINOPHEN 5-325 MG PO TABS: 1.0000 | ORAL_TABLET | Freq: Four times a day (QID) | ORAL | Status: DC | PRN

## 2015-04-07 NOTE — Progress Notes (Signed)
OFFICE VISIT  04/08/2015   CC:  Chief Complaint  Patient presents with  . Follow-up    Pt is not fasting.    HPI:    Patient is a 55 y.o. Caucasian female who presents for f/u meds/active chronic medical issues. Says back, knees, elbows, hips hurt "like my bones hurt".  No joint swelling.  She did not return to Dr. Rip Harbour at [facility] orth[facility] after I last saw her b/c of a work schedule conflict.  She states the therapy she has received there in the past has been injections (? Trochanteric bursa?  ?back?) and she says they have been helpful in the past.  Currently she has some Bio cleanse and Pro Bio-5 supplements that have vitamins and minerals in them and says these help a lot with her joint pains, seems to allow her to take less pain pills.  She is so busy with her life, though, that keeping on track with a routine of taking these meds is hard, so she still ends up taking 2-3 pain pills per day.  Celebrex daily is helpful per pt. HAs are better. Started walking at night more and this is helping a little. Stress is up, thinking about "what my last job did to me".   Adderall increases her stress/anxiety and we discussed cutting her 15mg  IR tab in half and trying this dose to ween off.  She then said that her most recent dose of this was 3 d/a and she has felt no adverse symptoms from the abrupt cessation of this med, so I recommended she simply not restart this med at all. She continues to feel that her citalopram is helpful for her anxiety and depression, as is prn alprazolam.  Past Medical History  Diagnosis Date  . Migraine     Proph tx with topamax helps, abortive tx with relpax helps  . Kidney stones 2005  . Chronic back pain     neck, too.  MRI L spine 05/2008: 40 degrees dextroscoliosis due to a hemivertebra of L3.  Some DDD/spondylosis w/out spinal stenosis.  . Anxiety   . Depression   . Adult ADHD   . Gout     2 attacks total as of 12/2014.    Past Surgical History  Procedure  Laterality Date  . Laparoscopic vaginal hysterectomy  12/02/07    for DUB  . Breast surgery  1999    reduction  . Kidney stone surgery  2005  . Endometrial ablation  2002    Outpatient Prescriptions Prior to Visit  Medication Sig Dispense Refill  . ALPRAZolam (XANAX) 1 MG tablet TAKE ONE TABLET TWICE DAILY AS NEEDED FOR ANXIETY 180 tablet 1  . celecoxib (CELEBREX) 200 MG capsule 1 tab po bid prn 60 capsule 5  . citalopram (CELEXA) 40 MG tablet TAKE ONE (1) TABLET EACH DAY 90 tablet 0  . colchicine 0.6 MG tablet 2 tabs po at onset of gout pain, then 1 tab po one hour later 15 tablet 2  . Cyanocobalamin (VITAMIN B-12 PO) Take 1 tablet by mouth daily. Reported on 04/07/2015    . cyclobenzaprine (FLEXERIL) 5 MG tablet Take 1 tablet (5 mg total) by mouth 3 (three) times daily as needed for muscle spasms. 30 tablet 1  . pantoprazole (PROTONIX) 40 MG tablet TAKE ONE TABLET ONCE DAILY 90 tablet 0  . SUMAtriptan (IMITREX) 100 MG tablet 1 tab at onset of HA, may repeat in 2 hrs if HA not 50% improved, max 200 mg in  24h 36 tablet 3  . amphetamine-dextroamphetamine (ADDERALL) 15 MG tablet TAKE ONE TABLET THREE TIMES DAILY DO NOT TAKE THIRD DOSE LATER THAN 5PM 90 tablet 0  . HYDROcodone-acetaminophen (NORCO/VICODIN) 5-325 MG tablet Take 1 tablet by mouth every 6 (six) hours as needed. 90 tablet 0   No facility-administered medications prior to visit.    No Known Allergies  ROS As per HPI  PE: Blood pressure 141/97, pulse 67, temperature 97.8 F (36.6 C), temperature source Oral, resp. rate 16, height 5' 3.5" (1.613 m), weight 179 lb (81.194 kg), SpO2 97 %. Gen: Alert, well appearing.  Patient is oriented to person, place, time, and situation. CV: RRR, no m/r/g.   LUNGS: CTA bilat, nonlabored resps, good aeration in all lung fields. Elbows, wrists, fingers all with full ROM and nontender and without swelling or erythema. Knees: TTP anteriorly bilat, and palpation makes her hurt in LLB region as  well. Sitting SLR + on R at 30 deg.  LABS:  None today  IMPRESSION AND PLAN:  1) Arthralgias of multiple joints, possibly osteoarthritis. No signs to suggest inflammatory arthritis.   Continue celebrex qd, continue current holistic-type arthralgia supplements (oral), and continue Vicodin 5/325 q6h prn---max of 3 pills a day.  We discussed once again today the potential drawbacks of relying on vicodin to treat her pain.  Needs to increase exercise, work on stress reduction techniques.  I did a rx today for vicodin 5/325, 1-2 q6h prn, #90 and handed the rx to the patient. I strongly encouraged her to make f/u appt with Dr. Rip Harbour at [facility] ortho for further attention to her back.  2) GAD and depression: The current medical regimen is effective;  continue present plan and medications. She is a bit more stressed than usual lately---she feels like she may need to change jobs again.  3) ADD: stop adderall b/c this is making her anxiety worse.  No new med started for this at this time. Would favor non-stimulant if another ADD med considered in future.  An After Visit Summary was printed and given to the patient.  FOLLOW UP: Return in about 3 months (around 07/08/2015) for routine chronic illness f/u (30 min).   Signed:  Crissie Sickles, MD           04/08/2015

## 2015-04-07 NOTE — Progress Notes (Signed)
Pre visit review using our clinic review tool, if applicable. No additional management support is needed unless otherwise documented below in the visit note. 

## 2015-04-08 ENCOUNTER — Encounter: Payer: Self-pay | Admitting: Family Medicine

## 2015-04-08 ENCOUNTER — Telehealth: Payer: Self-pay | Admitting: Family Medicine

## 2015-04-08 NOTE — Telephone Encounter (Signed)
Noted  

## 2015-04-08 NOTE — Telephone Encounter (Signed)
FYI

## 2015-04-08 NOTE — Telephone Encounter (Signed)
Wanted to let you know she found the source of her pain. She passed a kidney stone last night.

## 2015-05-28 ENCOUNTER — Other Ambulatory Visit: Payer: Self-pay | Admitting: Family Medicine

## 2015-06-28 ENCOUNTER — Other Ambulatory Visit: Payer: Self-pay | Admitting: Family Medicine

## 2015-06-28 ENCOUNTER — Telehealth: Payer: Self-pay | Admitting: Family Medicine

## 2015-06-28 MED ORDER — LISDEXAMFETAMINE DIMESYLATE 50 MG PO CAPS: 50.0000 mg | ORAL_CAPSULE | Freq: Every day | ORAL | Status: DC

## 2015-06-28 MED ORDER — HYDROCODONE-ACETAMINOPHEN 5-325 MG PO TABS: 1.0000 | ORAL_TABLET | Freq: Four times a day (QID) | ORAL | Status: DC | PRN

## 2015-06-28 NOTE — Telephone Encounter (Signed)
RF request for alprazolam LOV: 04/07/15 Next ov: None Last written: 02/16/15 #180 w/ 1RF  Please advise.Thanks.

## 2015-06-28 NOTE — Telephone Encounter (Signed)
Pt calling to request refills on her Hydrocodone and and states she would like to go back on Vyvanse as an extended release medication. Knows that insurance may not cover it so she understands that she may have to stay with the [facility]. She was advised that Dr Anitra Lauth is out of the office until tomorrow.

## 2015-06-28 NOTE — Telephone Encounter (Signed)
1) Pt would like to go back on Vyvanse as an extended release medication. Please advise. Thanks.   2) RF request for hydrocodone LOV: 04/07/15 Next ov: None Last written: 04/07/15 #90 w/ 0RF  Please advise. Thanks.

## 2015-06-28 NOTE — Telephone Encounter (Signed)
Vyvanse and vicodin rx printed. Tell pt to plan on f/u office visit in about 2 months.

## 2015-06-29 NOTE — Telephone Encounter (Signed)
Rx faxed

## 2015-06-29 NOTE — Telephone Encounter (Signed)
Left detailed message on cell vm, okay per DPR.  

## 2015-07-01 ENCOUNTER — Telehealth: Payer: Self-pay | Admitting: *Deleted

## 2015-07-01 MED ORDER — HYDROCODONE-ACETAMINOPHEN 5-325 MG PO TABS: 1.0000 | ORAL_TABLET | Freq: Four times a day (QID) | ORAL | Status: DC | PRN

## 2015-07-01 MED ORDER — LISDEXAMFETAMINE DIMESYLATE 50 MG PO CAPS: 50.0000 mg | ORAL_CAPSULE | Freq: Every day | ORAL | Status: DC

## 2015-07-01 NOTE — Telephone Encounter (Signed)
Patient aware that Rx's have been printed.  She will come by office tomorrow to pick up.

## 2015-07-01 NOTE — Telephone Encounter (Signed)
Pt called and stated that she thinks that she accidentally shredded her Rx's for Hydro/Apap and Vyvanse. Per Dr. Anitra Lauth we will need to contact pharmacy to see when her last fill date was. I advised pt that we will contact her late after we speak to her pharmacy. Pt voiced understanding.

## 2015-07-01 NOTE — Telephone Encounter (Signed)
Spoke to [place] at [facility], she stated that pt last filled Rx for Hydro/APAP on 03/13/15 and has no Rx on file. She stated that they have not filled Rx for Vyvanse and they have no Rx on file. Please advise. Thanks.

## 2015-07-01 NOTE — Telephone Encounter (Signed)
OK. I printed new rx for vyvanse and vicodin.

## 2015-07-03 ENCOUNTER — Other Ambulatory Visit: Payer: Self-pay | Admitting: Family Medicine

## 2015-07-05 NOTE — Telephone Encounter (Signed)
RF request for celecoxib LOV: 04/07/14 Next ov: None Last written: 08/03/14 #60 w/ 5RF  Please advise. Thanks.

## 2015-08-02 ENCOUNTER — Telehealth: Payer: Self-pay | Admitting: *Deleted

## 2015-08-02 NOTE — Telephone Encounter (Signed)
Pt LMOM on 08/02/15 at 8:34am stating that she is due for refill of her vyvanse. She stated that she found an old Rx that she thought had been shredded in her car. She wants to know if she can use this one or does she need to come in to pick up another Rx. Please advise. Thanks.

## 2015-08-02 NOTE — Telephone Encounter (Signed)
Ask her what the mg of th vyvanse is on the rx AND what is the date on the rx?

## 2015-08-02 NOTE — Telephone Encounter (Signed)
Spoke to pt she stated that she is at lunch right now and the Rx is at her office on her desk. She will call back with the information requested.

## 2015-08-03 NOTE — Telephone Encounter (Signed)
Yes, she can take this to her pharmacy and fill it.

## 2015-08-03 NOTE — Telephone Encounter (Signed)
Pt advised and voiced understanding.   

## 2015-08-03 NOTE — Telephone Encounter (Signed)
Pt LMOM on 08/03/15 at 1:00pm stating that the Rx was written on 06/28/15 and it was for Vyvanse 50mg  take one daily. Please advise. Thanks.

## 2015-08-20 ENCOUNTER — Other Ambulatory Visit: Payer: Self-pay | Admitting: Family Medicine

## 2015-08-20 NOTE — Telephone Encounter (Signed)
RF request for sumatriptan LOV: 04/07/15 Next ov: 08/26/15 Last written: 08/03/14 #36 w/ 3RF

## 2015-08-26 ENCOUNTER — Encounter: Payer: 59 | Admitting: Family Medicine

## 2015-09-01 ENCOUNTER — Other Ambulatory Visit: Payer: Self-pay | Admitting: Family Medicine

## 2015-09-01 MED ORDER — LISDEXAMFETAMINE DIMESYLATE 50 MG PO CAPS: 50.0000 mg | ORAL_CAPSULE | Freq: Every day | ORAL | 0 refills | Status: DC

## 2015-09-01 NOTE — Telephone Encounter (Signed)
RF request for vyvanse LOV: 04/07/15 Next ov: 09/14/15 Last written: 07/01/15 #30 w/ [id number]  Please advise. Thanks.

## 2015-09-01 NOTE — Telephone Encounter (Signed)
Patient aware Rx is at front desk for pick up.

## 2015-09-01 NOTE — Telephone Encounter (Signed)
Patient called to schedule physical as she is running low on her medication.  Patient was schedule for cpe on 8/29 at 8:15.  However, she states she will run out of lisdexamfetamine (VYVANSE) 50 MG capsule in 3 days.   Patient was offered an appt for a med check with pcp this week.  However, she declined appt stating she didn't want to make multiple trips to office for a med check and then later for her cpe.  Patient requesting to pick up rx for Vyvanse.

## 2015-09-14 ENCOUNTER — Encounter: Payer: Self-pay | Admitting: Family Medicine

## 2015-09-14 ENCOUNTER — Ambulatory Visit (INDEPENDENT_AMBULATORY_CARE_PROVIDER_SITE_OTHER): Payer: 59 | Admitting: Family Medicine

## 2015-09-14 ENCOUNTER — Encounter (INDEPENDENT_AMBULATORY_CARE_PROVIDER_SITE_OTHER): Payer: Self-pay | Admitting: *Deleted

## 2015-09-14 VITALS — BP 119/84 | HR 67 | Temp 97.4°F | Resp 16 | Ht 64.0 in | Wt 175.0 lb

## 2015-09-14 DIAGNOSIS — Z0000 Encounter for general adult medical examination without abnormal findings: Secondary | ICD-10-CM

## 2015-09-14 DIAGNOSIS — Z1211 Encounter for screening for malignant neoplasm of colon: Secondary | ICD-10-CM

## 2015-09-14 LAB — COMPREHENSIVE METABOLIC PANEL
ALT: 19 U/L (ref 0–35)
AST: 20 U/L (ref 0–37)
Albumin: 4 g/dL (ref 3.5–5.2)
Alkaline Phosphatase: 87 U/L (ref 39–117)
BUN: 10 mg/dL (ref 6–23)
CO2: 30 mEq/L (ref 19–32)
Calcium: 8.9 mg/dL (ref 8.4–10.5)
Chloride: 105 mEq/L (ref 96–112)
Creatinine, Ser: 0.79 mg/dL (ref 0.40–1.20)
GFR: 80.85 mL/min (ref >60.00)
Glucose, Bld: 100 mg/dL — ABNORMAL HIGH (ref 70–99)
Potassium: 4.2 mEq/L (ref 3.5–5.1)
Sodium: 140 mEq/L (ref 135–145)
Total Bilirubin: 0.4 mg/dL (ref 0.2–1.2)
Total Protein: 6.4 g/dL (ref 6.0–8.3)

## 2015-09-14 LAB — CBC WITH DIFFERENTIAL/PLATELET
Basophils Absolute: 0 10*3/uL (ref 0.0–0.1)
Basophils Relative: 0.7 % (ref 0.0–3.0)
Eosinophils Absolute: 0.2 10*3/uL (ref 0.0–0.7)
Eosinophils Relative: 3.1 % (ref 0.0–5.0)
HCT: 42 % (ref 36.0–46.0)
Hemoglobin: 14.2 g/dL (ref 12.0–15.0)
Lymphocytes Relative: 44.7 % (ref 12.0–46.0)
Lymphs Abs: 2.4 10*3/uL (ref 0.7–4.0)
MCHC: 33.7 g/dL (ref 30.0–36.0)
MCV: 87.6 fl (ref 78.0–100.0)
Monocytes Absolute: 0.4 10*3/uL (ref 0.1–1.0)
Monocytes Relative: 7.3 % (ref 3.0–12.0)
Neutro Abs: 2.3 10*3/uL (ref 1.4–7.7)
Neutrophils Relative %: 44.2 % (ref 43.0–77.0)
Platelets: 209 10*3/uL (ref 150.0–400.0)
RBC: 4.8 Mil/uL (ref 3.87–5.11)
RDW: 13.1 % (ref 11.5–15.5)
WBC: 5.3 10*3/uL (ref 4.0–10.5)

## 2015-09-14 LAB — LIPID PANEL
Cholesterol: 197 mg/dL (ref 0–200)
HDL: 44.7 mg/dL (ref >39.00)
LDL Cholesterol: 129 mg/dL — ABNORMAL HIGH (ref 0–99)
NonHDL: 152.17
Total CHOL/HDL Ratio: 4
Triglycerides: 114 mg/dL (ref 0.0–149.0)
VLDL: 22.8 mg/dL (ref 0.0–40.0)

## 2015-09-14 LAB — TSH: TSH: 1.99 u[IU]/mL (ref 0.35–4.50)

## 2015-09-14 MED ORDER — LISDEXAMFETAMINE DIMESYLATE 50 MG PO CAPS: 50.0000 mg | ORAL_CAPSULE | Freq: Every day | ORAL | 0 refills | Status: DC

## 2015-09-14 MED ORDER — LISDEXAMFETAMINE DIMESYLATE 50 MG PO CAPS
50.0000 mg | ORAL_CAPSULE | Freq: Every day | ORAL | 0 refills | Status: DC
Start: 2015-09-14 — End: 2015-09-14

## 2015-09-14 NOTE — Progress Notes (Signed)
Office Note 09/14/2015  CC:  Chief Complaint  Patient presents with  . Annual Exam    HPI:  Jacqueline Vance is a 55 y.o. White female who is here for annual health maintenance exam. Doing well. Still walking for exercise. Has appt at [facility] to get her mammogram this week.  Vision: 6 mo ago--all good. Dental visits UTD.  Past Medical History:  Diagnosis Date  . Adult ADHD   . Anxiety   . Chronic back pain    neck, too.  MRI L spine 05/2008: 40 degrees dextroscoliosis due to a hemivertebra of L3.  Some DDD/spondylosis w/out spinal stenosis.  . Depression   . Gout    2 attacks total as of 12/2014.  [place] Kidney stones 2005; 2017  . Migraine    Proph tx with topamax helps, abortive tx with relpax helps    Past Surgical History:  Procedure Laterality Date  . BREAST SURGERY  1999   reduction  . ENDOMETRIAL ABLATION  2002  . KIDNEY STONE SURGERY  2005  . LAPAROSCOPIC VAGINAL HYSTERECTOMY  12/02/07   for DUB    Family History  Problem Relation Age of Onset  . Cancer Mother     brain ca (glioblastoma)    Social History   Social History  . Marital status: Married    Spouse name: N/A  . Number of children: N/A  . Years of education: N/A   Occupational History  . Not on file.   Social History Main Topics  . Smoking status: Never Smoker  . Smokeless tobacco: Never Used  . Alcohol use No  . Drug use: No  . Sexual activity: Not on file   Other Topics Concern  . Not on file   Social History Narrative   Married, one adult son.  Lives in [place], [place].   Works for [employer] in [place].    Exercise: walks 3 miles per day.   No T/A/Ds.                   Outpatient Medications Prior to Visit  Medication Sig Dispense Refill  . ALPRAZolam (XANAX) 1 MG tablet TAKE ONE TABLET TWICE DAILY AS NEEDED FOR ANXIETY 180 tablet 1  . celecoxib (CELEBREX) 200 MG capsule TAKE ONE CAPSULE BY MOUTH TWICE DAILY AS NEEDED 60 capsule 6  . citalopram  (CELEXA) 40 MG tablet TAKE ONE (1) TABLET EACH DAY 90 tablet 1  . colchicine 0.6 MG tablet 2 tabs po at onset of gout pain, then 1 tab po one hour later 15 tablet 2  . Cyanocobalamin (VITAMIN B-12 PO) Take 1 tablet by mouth daily. Reported on 04/07/2015    . cyclobenzaprine (FLEXERIL) 5 MG tablet Take 1 tablet (5 mg total) by mouth 3 (three) times daily as needed for muscle spasms. 30 tablet 1  . HYDROcodone-acetaminophen (NORCO/VICODIN) 5-325 MG tablet Take 1 tablet by mouth every 6 (six) hours as needed. 90 tablet 0  . pantoprazole (PROTONIX) 40 MG tablet TAKE ONE (1) TABLET EACH DAY 90 tablet 1  . SUMAtriptan (IMITREX) 100 MG tablet TAKE ONE TABLET AT ONSET OF HEADACHE MAY REPEAT IN 2 HOURS IF HEADACHE IS NOT 50% IMPROVED 36 tablet 3  . lisdexamfetamine (VYVANSE) 50 MG capsule Take 1 capsule (50 mg total) by mouth daily. 30 capsule 0   No facility-administered medications prior to visit.     No Known Allergies  ROS Review of Systems  PE; Blood pressure 119/84, pulse 67, temperature 97.4 F (  36.3 C), temperature source Oral, resp. rate 16, height 5\' 4"  (1.626 m), weight 175 lb (79.4 kg), SpO2 94 %. Gen: Alert, well appearing.  Patient is oriented to person, place, time, and situation. AFFECT: pleasant, lucid thought and speech. ENT: Ears: EACs clear, normal epithelium.  TMs with good light reflex and landmarks bilaterally.  Eyes: no injection, icteris, swelling, or exudate.  EOMI, PERRLA. Nose: no drainage or turbinate edema/swelling.  No injection or focal lesion.  Mouth: lips without lesion/swelling.  Oral mucosa pink and moist.  Dentition intact and without obvious caries or gingival swelling.  Oropharynx without erythema, exudate, or swelling.  Neck: supple/nontender.  No LAD, mass, or TM.  Carotid pulses 2+ bilaterally, without bruits. CV: RRR, no m/r/g.   LUNGS: CTA bilat, nonlabored resps, good aeration in all lung fields. ABD: soft, NT, ND, BS normal.  No hepatospenomegaly or  mass.  No bruits. EXT: no clubbing, cyanosis, or edema.  Musculoskeletal: no joint swelling, erythema, warmth, or tenderness.  ROM of all joints intact. Skin - no sores or suspicious lesions or rashes or color changes  Pertinent labs:  None today  ASSESSMENT AND PLAN:   Health maintenance exam: Reviewed age and gender appropriate health maintenance issues (prudent diet, regular exercise, health risks of tobacco and excessive alcohol, use of seatbelts, fire alarms in home, use of sunscreen).  Also reviewed age and gender appropriate health screening as well as vaccine recommendations. Vaccines UTD. HP labs-fasting-today. Colon ca screening: referred to GI (Dr. Laural Golden) for her first screening colonoscopy. She gets mammogram this week for breast ca screening. Not a candidate for cerv ca screening due to having hysterectomy for benign reasons. I gave pt 3 rx's for her vyvanse today: I printed rx's for vyvanse 50mg , 1 qd, #30 today for Sept, Oct, and Nov 2017.  Appropriate fill on/after date was noted on each rx.  An After Visit Summary was printed and given to the patient.  FOLLOW UP:  Return in about 4 months (around 01/14/2016) for routine chronic illness f/u.  Signed:  Crissie Sickles, MD           09/14/2015

## 2015-09-14 NOTE — Progress Notes (Signed)
Pre visit review using our clinic review tool, if applicable. No additional management support is needed unless otherwise documented below in the visit note. 

## 2015-10-26 ENCOUNTER — Other Ambulatory Visit: Payer: Self-pay | Admitting: Family Medicine

## 2015-10-26 MED ORDER — HYDROCODONE-ACETAMINOPHEN 5-325 MG PO TABS: 1.0000 | ORAL_TABLET | Freq: Four times a day (QID) | ORAL | 0 refills | Status: DC | PRN

## 2015-10-26 NOTE — Telephone Encounter (Signed)
Pt requesting RF of hydrocodone. Last Rx printed 07/01/15. Last OV 09/14/15. No upcoming OV.  Please advise.

## 2015-10-26 NOTE — Telephone Encounter (Signed)
rx placed at front desk.  Pt aware to pick up.

## 2015-12-07 ENCOUNTER — Other Ambulatory Visit: Payer: Self-pay | Admitting: Family Medicine

## 2015-12-07 NOTE — Telephone Encounter (Signed)
RF request for pantoprazole LOV: 09/14/15 Next ov: None Last written: 05/28/15 #90 w/ 1RF

## 2015-12-07 NOTE — Telephone Encounter (Signed)
RF request for citalopram LOV: 09/14/15 Next ov: None Last written: 05/28/15 #90 w/ 1RF

## 2015-12-15 ENCOUNTER — Other Ambulatory Visit: Payer: Self-pay | Admitting: *Deleted

## 2015-12-15 MED ORDER — HYDROCODONE-ACETAMINOPHEN 5-325 MG PO TABS: 1.0000 | ORAL_TABLET | Freq: Four times a day (QID) | ORAL | 0 refills | Status: DC | PRN

## 2015-12-15 NOTE — Telephone Encounter (Signed)
Pt LMOM on 12/15/15 at 11:39am requesting refill for pain medication.  RF request for hydrocodone LOV: 09/14/15 Next ov: None Last written: 10/26/15 #90 w/ [id number]  Please advise. Thanks.

## 2015-12-15 NOTE — Telephone Encounter (Signed)
Rx printed/signed and put up front for p/u. Tried calling pt NA and unable to leave a message.

## 2015-12-17 ENCOUNTER — Other Ambulatory Visit: Payer: Self-pay | Admitting: Family Medicine

## 2015-12-17 MED ORDER — ALPRAZOLAM 1 MG PO TABS: ORAL_TABLET | ORAL | 1 refills | Status: DC

## 2015-12-17 NOTE — Telephone Encounter (Signed)
Per Diane Rx was picked up by pts brother on 12/16/15.

## 2015-12-17 NOTE — Telephone Encounter (Signed)
Refill request for Sumatriptan, last prescription filled 08/20/15 #36 with 4 Refills.

## 2015-12-17 NOTE — Telephone Encounter (Signed)
RF request for alprazolam LOV: 09/14/15 Next ov: None Last written: 06/28/15 #180 w/ 1RF  Please advise. Thanks.

## 2015-12-17 NOTE — Telephone Encounter (Signed)
Rx faxed

## 2016-01-06 ENCOUNTER — Telehealth: Payer: Self-pay | Admitting: Family Medicine

## 2016-01-06 ENCOUNTER — Other Ambulatory Visit: Payer: Self-pay | Admitting: Family Medicine

## 2016-01-06 MED ORDER — LISDEXAMFETAMINE DIMESYLATE 50 MG PO CAPS: 50.0000 mg | ORAL_CAPSULE | Freq: Every day | ORAL | 0 refills | Status: DC

## 2016-01-06 NOTE — Telephone Encounter (Signed)
Patient calling to request 90 day supply of lisdexamfetamine (VYVANSE) 50 MG capsule.

## 2016-01-06 NOTE — Telephone Encounter (Signed)
Patient requesting refill on Vyvanse 90 day supply.  Please advise.

## 2016-01-06 NOTE — Telephone Encounter (Signed)
90 d supply printed. Pt needs to have f/u appt in 3 mo.-thx

## 2016-01-07 NOTE — Telephone Encounter (Signed)
Patient notified and verbalized understanding. Rx is being held at front office for patient to pick up.

## 2016-01-17 DIAGNOSIS — R911 Solitary pulmonary nodule: Secondary | ICD-10-CM

## 2016-01-17 HISTORY — DX: Solitary pulmonary nodule: R91.1

## 2016-02-10 ENCOUNTER — Other Ambulatory Visit: Payer: Self-pay | Admitting: Family Medicine

## 2016-02-10 ENCOUNTER — Telehealth: Payer: Self-pay | Admitting: Family Medicine

## 2016-02-10 MED ORDER — LISDEXAMFETAMINE DIMESYLATE 50 MG PO CAPS: 50.0000 mg | ORAL_CAPSULE | Freq: Every day | ORAL | 0 refills | Status: DC

## 2016-02-10 MED ORDER — HYDROCODONE-ACETAMINOPHEN 5-325 MG PO TABS: 1.0000 | ORAL_TABLET | Freq: Four times a day (QID) | ORAL | 0 refills | Status: DC | PRN

## 2016-02-10 NOTE — Telephone Encounter (Signed)
As per pt's request, I did 1 rx for her norco and 2 rx's for her vyvanse. She needs f/u in March for f/u adult ADD (before she runs out of her vyvanse).-thx

## 2016-02-10 NOTE — Telephone Encounter (Signed)
[place] Controlled Substance report is on Dr. Idelle Leech desk. Please advise. Thanks.

## 2016-02-10 NOTE — Telephone Encounter (Signed)
Patient called requesting RF of vyvanse and hydrocodone.  She states that her last RX for vyvanse was 90 day RX but her insurance wouldn't cover it.  The pharmacy did fill 30 day supply but then told the patient to contact MD to get 30 day supply scripts.  Patient states that she went to [facility].  Please advise if she can get new scripts.  Thanks.

## 2016-02-11 NOTE — Telephone Encounter (Signed)
Pt advised and voiced understanding. Rx's put up front for p/u.  

## 2016-02-11 NOTE — Telephone Encounter (Signed)
Rx's printed, signed and put up front for p/u. Pt advised and voiced understanding.

## 2016-03-20 ENCOUNTER — Ambulatory Visit: Payer: 59 | Admitting: Family Medicine

## 2016-03-30 ENCOUNTER — Other Ambulatory Visit: Payer: Self-pay | Admitting: Family Medicine

## 2016-03-30 MED ORDER — LISDEXAMFETAMINE DIMESYLATE 50 MG PO CAPS: 50.0000 mg | ORAL_CAPSULE | Freq: Every day | ORAL | 0 refills | Status: DC

## 2016-03-30 MED ORDER — HYDROCODONE-ACETAMINOPHEN 5-325 MG PO TABS: 1.0000 | ORAL_TABLET | Freq: Four times a day (QID) | ORAL | 0 refills | Status: DC | PRN

## 2016-03-30 NOTE — Telephone Encounter (Signed)
RF request for vyvanse LOV: 09/14/15 Next ov: 04/04/16 Last written: 02/10/16 #30 w/ 0RF  RF request for hydro/apap Last written: 02/10/16 #90 w/ 0RF  *pt was advised on 01/06/17 that she would need a f/u apt for more refills, apt for 03/20/16 was cancelled.  Please advise. Thanks.    *There should be a Rx on file at pharmacy for the alprazolam pt needs to contact pharmacy for refill.

## 2016-03-30 NOTE — Telephone Encounter (Signed)
Pt advised and voiced understanding.  Pt also confirmed apt for 04/04/16 with Dr. Anitra Lauth.

## 2016-03-30 NOTE — Telephone Encounter (Signed)
**  Remind patient they can make refill requests via MyChart**  Medication refill request (Name & Dosage): lisdexamfetamine (VYVANSE) 50 MG capsule  HYDROcodone-acetaminophen (NORCO/VICODIN) 5-325 MG Tablet  ALPRAZolam (XANAX) 1 MG tablet  Preferred pharmacy (Name & Address): [facility]    Other comments (if applicable):

## 2016-04-04 ENCOUNTER — Ambulatory Visit: Payer: 59 | Admitting: Family Medicine

## 2016-04-12 ENCOUNTER — Other Ambulatory Visit: Payer: Self-pay | Admitting: Obstetrics and Gynecology

## 2016-04-12 DIAGNOSIS — Z1231 Encounter for screening mammogram for malignant neoplasm of breast: Secondary | ICD-10-CM

## 2016-04-13 ENCOUNTER — Ambulatory Visit: Payer: 59

## 2016-04-19 ENCOUNTER — Ambulatory Visit: Payer: 59 | Admitting: Family Medicine

## 2016-04-24 ENCOUNTER — Ambulatory Visit (INDEPENDENT_AMBULATORY_CARE_PROVIDER_SITE_OTHER): Payer: 59 | Admitting: Family Medicine

## 2016-04-24 ENCOUNTER — Encounter: Payer: Self-pay | Admitting: Family Medicine

## 2016-04-24 VITALS — BP 112/75 | HR 82 | Temp 98.1°F | Resp 16 | Ht 64.0 in | Wt 171.0 lb

## 2016-04-24 DIAGNOSIS — R002 Palpitations: Secondary | ICD-10-CM | POA: Diagnosis not present

## 2016-04-24 DIAGNOSIS — Z0000 Encounter for general adult medical examination without abnormal findings: Secondary | ICD-10-CM | POA: Diagnosis not present

## 2016-04-24 DIAGNOSIS — F909 Attention-deficit hyperactivity disorder, unspecified type: Secondary | ICD-10-CM

## 2016-04-24 DIAGNOSIS — G43909 Migraine, unspecified, not intractable, without status migrainosus: Secondary | ICD-10-CM | POA: Diagnosis not present

## 2016-04-24 DIAGNOSIS — G894 Chronic pain syndrome: Secondary | ICD-10-CM | POA: Diagnosis not present

## 2016-04-24 MED ORDER — HYDROCODONE-ACETAMINOPHEN 5-325 MG PO TABS: 1.0000 | ORAL_TABLET | Freq: Four times a day (QID) | ORAL | 0 refills | Status: DC | PRN

## 2016-04-24 MED ORDER — ELETRIPTAN HYDROBROMIDE 40 MG PO TABS: ORAL_TABLET | ORAL | 2 refills | Status: DC

## 2016-04-24 MED ORDER — LISDEXAMFETAMINE DIMESYLATE 50 MG PO CAPS: 50.0000 mg | ORAL_CAPSULE | Freq: Every day | ORAL | 0 refills | Status: DC

## 2016-04-24 NOTE — Progress Notes (Signed)
Pre visit review using our clinic review tool, if applicable. No additional management support is needed unless otherwise documented below in the visit note. 

## 2016-04-24 NOTE — Progress Notes (Signed)
OFFICE VISIT  04/24/2016   CC:  Chief Complaint  Patient presents with  . Follow-up    RCI,    HPI:    Patient is a 56 y.o. Caucasian female who presents for 7 mo f/u adult ADD, chronic neck and low back pain (and various musculoskeletal pains considered to be osteoarthritis) which I rx narcotic pain meds for in order to maximize functioning and quality of life.  Also f/u migraines.  Indication for chronic opioid: see above Medication and dose: Vicodin 5/325, 1 tab tid prn. # pills per month: 90 Last UDS date: none Pain contract signed (Y/N): Y.  Signed 07/17/2013. Date narcotic database last reviewed (include red flags): today, no red flags.  Most recent dose of vicodin was yesterday. Most recent dose of alprazolam: 2-3 nights ago was last dose. Most recent vyvanse was today.  Says back pain is well controlled on current pain regimen.  No side effects felt.  Usually takes 1-3 per day. Last week got both trochanteric bursa injection with steroid and rx'd a steroid taper. This has resulted in great improvement in hips. Taking celebrex the last 2 wks---makes a huge difference--was out of this for awhile due to financial constraints.  ADD: vyvanse helps well with focus, concentration, motivation.  She admits she rarely gets brief time of being aware of rapid heart rate, increased anxiety.  Denies sense of skipping, jumping, or delay in heart beat.  Has intermittent sharp sternal pains when she gets this.   Gets this very rarely.  Most recently she was at the beach and was on vicodin, vyvanse, and alpraz--and was feeling this intermittently.  Admits to a lot of stress lately: out of work  Has a younger brother living with her lately and this is stressful.  Migraine HA: has been on imitrex b/c of financial reasons.  Says this med helps but causes bilat arm numbness every time she takes it.  She asks to go back to use of relpax.    Past Medical History:  Diagnosis Date  . Adult ADHD    . Anxiety   . Chronic back pain    neck, too.  MRI L spine 05/2008: 40 degrees dextroscoliosis due to a hemivertebra of L3.  Some DDD/spondylosis w/out spinal stenosis.  . Depression   . Gout    2 attacks total as of 12/2014.  Marland Kitchen Kidney stones 2005; 2017  . Migraine    Proph tx with topamax helps, abortive tx with relpax helps    Past Surgical History:  Procedure Laterality Date  . BREAST SURGERY  1999   reduction  . ENDOMETRIAL ABLATION  2002  . KIDNEY STONE SURGERY  2005  . LAPAROSCOPIC VAGINAL HYSTERECTOMY  12/02/07   for DUB    Outpatient Medications Prior to Visit  Medication Sig Dispense Refill  . ALPRAZolam (XANAX) 1 MG tablet TAKE ONE TABLET TWICE DAILY AS NEEDED FOR ANXIETY 180 tablet 1  . celecoxib (CELEBREX) 200 MG capsule TAKE ONE CAPSULE BY MOUTH TWICE DAILY AS NEEDED 60 capsule 6  . citalopram (CELEXA) 40 MG tablet TAKE 1 TABLE EACH DAY 90 tablet 3  . colchicine 0.6 MG tablet 2 tabs po at onset of gout pain, then 1 tab po one hour later 15 tablet 2  . Cyanocobalamin (VITAMIN B-12 PO) Take 1 tablet by mouth daily. Reported on 04/07/2015    . pantoprazole (PROTONIX) 40 MG tablet TAKE 1 TABLET EACH DAY 90 tablet 3  . HYDROcodone-acetaminophen (NORCO/VICODIN) 5-325 MG tablet Take  1 tablet by mouth every 6 (six) hours as needed. 90 tablet 0  . lisdexamfetamine (VYVANSE) 50 MG capsule Take 1 capsule (50 mg total) by mouth daily. 30 capsule 0  . SUMAtriptan (IMITREX) 100 MG tablet TAKE ONE TABLET AT ONSET OF HEADACHE MAY REPEAT IN 2 HOURS IF HEADACHE IS NOT 50% IMPROVED 36 tablet 1  . cyclobenzaprine (FLEXERIL) 5 MG tablet Take 1 tablet (5 mg total) by mouth 3 (three) times daily as needed for muscle spasms. (Patient not taking: Reported on 04/24/2016) 30 tablet 1   No facility-administered medications prior to visit.     No Known Allergies  ROS As per HPI  PE: Blood pressure 112/75, pulse 82, temperature 98.1 F (36.7 C), temperature source Oral, resp. rate 16, height  5\' 4"  (1.626 m), weight 171 lb (77.6 kg), SpO2 95 %. Wt Readings from Last 2 Encounters:  04/24/16 171 lb (77.6 kg)  09/14/15 175 lb (79.4 kg)    Gen: alert, oriented x 4, affect pleasant.  Lucid thinking and conversation noted. HEENT: PERRLA, EOMI.   Neck: no LAD, mass, or thyromegaly. CV: RRR, no m/r/g LUNGS: CTA bilat, nonlabored. NEURO: no tremor or tics noted on observation.  Coordination intact. CN 2-12 grossly intact bilaterally, strength 5/5 in all extremeties.  No ataxia.   LABS:  Lab Results  Component Value Date   TSH 1.99 09/14/2015   Lab Results  Component Value Date   WBC 5.3 09/14/2015   HGB 14.2 09/14/2015   HCT 42.0 09/14/2015   MCV 87.6 09/14/2015   PLT 209.0 09/14/2015   Lab Results  Component Value Date   CREATININE 0.79 09/14/2015   BUN 10 09/14/2015   NA 140 09/14/2015   K 4.2 09/14/2015   CL 105 09/14/2015   CO2 30 09/14/2015   Lab Results  Component Value Date   ALT 19 09/14/2015   AST 20 09/14/2015   ALKPHOS 87 09/14/2015   BILITOT 0.4 09/14/2015   Lab Results  Component Value Date   CHOL 197 09/14/2015   Lab Results  Component Value Date   HDL 44.70 09/14/2015   Lab Results  Component Value Date   LDLCALC 129 (H) 09/14/2015   Lab Results  Component Value Date   TRIG 114.0 09/14/2015   Lab Results  Component Value Date   CHOLHDL 4 09/14/2015    IMPRESSION AND PLAN:  1) Adult ADD: The current medical regimen is effective;  continue present plan and medications. I printed rx's for vyvanse 50mg  qd, #30 today for this month, May 2018, and June 2018.  Appropriate fill on/after date was noted on each rx.  2) Chronic LB spondylosis pain.  See pain mgmt section of HPI--stable. I printed rx's for vicodin 5/325, 1 tab tid prn, #90 today for this month, May 2018, and June 2018.  Appropriate fill on/after date was noted on each rx.   Urine tox screen obtained today.  3) Migraine HA's: she can no longer afford the topamax, which  was helping with prevention.  She declines trial of new preventative med at this time.  She does want to switch from imitrex back to relpax due to consistent bilat arm numbness feeling every time she takes imitrex. Relpax rx sent to pharmacy today.  4) preventative health care: Prescription for shinrix vaccine given to patient today.  5) Palpitations: very rare, seemed to be tied to a time when she was especially anxious and had taken alprazolam, vyvanse, and vicodin all at the same time.  We reviewed best ways to avoid this symptom. Signs/symptoms to call or return for were reviewed and pt expressed understanding.  An After Visit Summary was printed and given to the patient.  FOLLOW UP: Return in about 4 months (around 08/24/2016) for routine chronic illness f/u.  Signed:  Crissie Sickles, MD           04/24/2016

## 2016-04-25 ENCOUNTER — Encounter: Payer: Self-pay | Admitting: Family Medicine

## 2016-05-19 ENCOUNTER — Telehealth: Payer: Self-pay | Admitting: Family Medicine

## 2016-05-19 NOTE — Telephone Encounter (Signed)
Called and notified pt that her UDS on 04/25/16 did not show amphetamines as expected since she is on vyvanse.  She noted that she took vyvanse the day of her UDS but thinks she had not been on it the few days prior to UDS b/c of forgetting to take it often. Per protocol, I told her I need her to come back for repeat UDS and she needs to bring her pill bottle for pill count as well.  Pt was told to make lab appt for this.

## 2016-05-25 ENCOUNTER — Encounter: Payer: Self-pay | Admitting: Family Medicine

## 2016-05-25 NOTE — Telephone Encounter (Signed)
Noted  

## 2016-05-25 NOTE — Telephone Encounter (Signed)
Pts medication was counted by Lattie Haw and witnessed by Nira Conn (me). Pt was present during time of count. Pt had 12 remaining capsules of Vyvanse in her bottle. Medication was put back in bottle by Lattie Haw and given back to pt.

## 2016-05-25 NOTE — Telephone Encounter (Signed)
Patent returned to office today to give UDS and to let us count her Vyvanse capsules.   Patient had 12 remaining capsules in her bottle.

## 2016-05-30 ENCOUNTER — Ambulatory Visit: Payer: 59

## 2016-05-31 ENCOUNTER — Telehealth: Payer: Self-pay | Admitting: Family Medicine

## 2016-05-31 NOTE — Telephone Encounter (Signed)
Pls call pt and notify her that her vyvanse DID show up positive on her urine toxicology screen this time. Tell her thanks for coming by and repeating it and doing the pill count like I asked.

## 2016-06-01 NOTE — Telephone Encounter (Signed)
Left detailed message on cell vm, okay per DPR.  

## 2016-06-02 ENCOUNTER — Ambulatory Visit: Payer: 59

## 2016-06-20 ENCOUNTER — Other Ambulatory Visit: Payer: Self-pay | Admitting: Family Medicine

## 2016-06-23 ENCOUNTER — Ambulatory Visit: Payer: 59

## 2016-07-14 ENCOUNTER — Other Ambulatory Visit: Payer: Self-pay | Admitting: Family Medicine

## 2016-08-16 DIAGNOSIS — I700 Atherosclerosis of aorta: Secondary | ICD-10-CM

## 2016-08-16 DIAGNOSIS — R911 Solitary pulmonary nodule: Secondary | ICD-10-CM

## 2016-08-16 HISTORY — DX: Solitary pulmonary nodule: R91.1

## 2016-08-16 HISTORY — DX: Atherosclerosis of aorta: I70.0

## 2016-08-17 ENCOUNTER — Telehealth: Payer: Self-pay | Admitting: Family Medicine

## 2016-08-17 ENCOUNTER — Other Ambulatory Visit: Payer: Self-pay | Admitting: Family Medicine

## 2016-08-17 MED ORDER — HYDROCODONE-ACETAMINOPHEN 5-325 MG PO TABS: 1.0000 | ORAL_TABLET | Freq: Four times a day (QID) | ORAL | 0 refills | Status: DC | PRN

## 2016-08-17 MED ORDER — LISDEXAMFETAMINE DIMESYLATE 50 MG PO CAPS: 50.0000 mg | ORAL_CAPSULE | Freq: Every day | ORAL | 0 refills | Status: DC

## 2016-08-17 NOTE — Telephone Encounter (Signed)
I printed rx's for vicodin, #90 and vyvanse #30 today.  Xanax rx'd last time with 90 day supply with 1 RF, so a new rx for this is not needed at this time. Pt must have ADD, anxiety, chronic pain f/u within the next 30d. --thx

## 2016-08-17 NOTE — Telephone Encounter (Signed)
Patient requesting RF of  vyvanse - last RX stated fill on or after 07/05/16 Xanax- last RX filled 06/21/16  hydrocodone - last RX stated fill on or after 06/27/16  Last OV 04/24/16 Patient due for OV  Please advise.

## 2016-08-17 NOTE — Telephone Encounter (Signed)
Patient's husband has RX's and is aware patient needs f/u that must be separate from CPE.

## 2016-08-17 NOTE — Progress Notes (Signed)
Pt's husband in for o/v today, requested wife's rx's for vyvanse and vicodin. Mrs Bignell will be notified she needs f/u appt within the next 30d. I gave rx's to her husband for her today:  30d supply of vyvanse and vicodin today.  Signed:  Crissie Sickles, MD           08/17/2016

## 2016-08-21 ENCOUNTER — Emergency Department (HOSPITAL_COMMUNITY): Payer: 59

## 2016-08-21 ENCOUNTER — Encounter (HOSPITAL_COMMUNITY): Payer: Self-pay | Admitting: Emergency Medicine

## 2016-08-21 ENCOUNTER — Emergency Department (HOSPITAL_COMMUNITY)
Admission: EM | Admit: 2016-08-21 | Discharge: 2016-08-21 | Disposition: A | Discharge status: Home / Self Care | Payer: 59 | Attending: Emergency Medicine | Admitting: Emergency Medicine

## 2016-08-21 DIAGNOSIS — R1032 Left lower quadrant pain: Secondary | ICD-10-CM | POA: Insufficient documentation

## 2016-08-21 DIAGNOSIS — Z79899 Other long term (current) drug therapy: Secondary | ICD-10-CM | POA: Diagnosis not present

## 2016-08-21 DIAGNOSIS — M545 Low back pain: Secondary | ICD-10-CM | POA: Diagnosis present

## 2016-08-21 DIAGNOSIS — R109 Unspecified abdominal pain: Secondary | ICD-10-CM

## 2016-08-21 LAB — URINALYSIS, MICROSCOPIC (REFLEX): RBC / HPF: NONE SEEN RBC/hpf (ref 0–5)

## 2016-08-21 LAB — URINALYSIS, ROUTINE W REFLEX MICROSCOPIC
Bilirubin Urine: NEGATIVE
Glucose, UA: NEGATIVE mg/dL
Hgb urine dipstick: NEGATIVE
Ketones, ur: NEGATIVE mg/dL
Leukocytes, UA: NEGATIVE
Nitrite: NEGATIVE
Protein, ur: 30 mg/dL — AB
Specific Gravity, Urine: <1.005 — ABNORMAL LOW (ref 1.005–1.030)
pH: 8 (ref 5.0–8.0)

## 2016-08-21 MED ORDER — ONDANSETRON 8 MG PO TBDP
8.0000 mg | ORAL_TABLET | Freq: Once | ORAL | Status: AC
  Administered 2016-08-21: 8 mg via ORAL
  Filled 2016-08-21: qty 1

## 2016-08-21 MED ORDER — MORPHINE SULFATE (PF) 4 MG/ML IV SOLN
4.0000 mg | Freq: Once | INTRAVENOUS | Status: AC
  Administered 2016-08-21: 4 mg via INTRAMUSCULAR
  Filled 2016-08-21: qty 1

## 2016-08-21 MED ORDER — METHOCARBAMOL 500 MG PO TABS: 1000.0000 mg | ORAL_TABLET | Freq: Four times a day (QID) | ORAL | 0 refills | Status: DC | PRN

## 2016-08-21 NOTE — ED Triage Notes (Signed)
Pt c/o lower back pain and radiates to the left flank since this weekend.

## 2016-08-21 NOTE — Discharge Instructions (Signed)
Take the prescription as directed.  Apply moist heat or ice to the area(s) of discomfort, for 15 minutes at a time, several times per day for the next few days.  Do not fall asleep on a heating or ice pack.  Your CT scan showed an incidental finding: "6 mm LEFT lower lobe pulmonary nodule. Recommend a CT chest at 6-12 months." Call your regular medical doctor tomorrow to schedule a follow up appointment within the next 3 days.  Return to the Emergency Department immediately sooner if worsening.

## 2016-08-21 NOTE — ED Notes (Signed)
Pt to CT

## 2016-08-21 NOTE — ED Provider Notes (Signed)
[facility] DEPT Provider Note   CSN: [id number] Arrival date & time: 08/21/16  1851     History   Chief Complaint Chief Complaint  Patient presents with  . Flank Pain    HPI Jacqueline Vance is a 56 y.o. female.  HPI  Pt was seen at [facility]. Per pt, c/o sudden onset and persistence of worsening of left sided low back "pain" for the past 3 days. Pt describes the pain as "like my last kidney stone," and radiating into the left side of her abd.  Has been associated with multiple intermittent episodes of N/V/D. Pain worsens with movement.  Denies dysuria/hematuria, no abd pain, no black or blood in stools or emesis, no CP/SOB, no fevers, no rash.    Past Medical History:  Diagnosis Date  . Adult ADHD   . Anxiety   . Chronic back pain    neck, too.  MRI L spine 05/2008: 40 degrees dextroscoliosis due to a hemivertebra of L3.  Some DDD/spondylosis w/out spinal stenosis.  . Depression   . Gout    2 attacks total as of 12/2014.  Marland Kitchen Kidney stones 2005; 2017  . Migraine    Proph tx with topamax helps, abortive tx with relpax helps    Patient Active Problem List   Diagnosis Date Noted  . Chronic mixed headache syndrome 08/03/2014  . Cervicalgia 08/03/2014  . Chronic low back pain 08/03/2014  . Anxiety and depression 08/03/2014  . Adult ADHD 07/30/2013  . GAD (generalized anxiety disorder) 07/17/2013  . Health maintenance examination 08/12/2012  . Lymphadenitis 05/26/2011  . Viral syndrome 05/26/2011  . Fatigue 05/26/2011  . Vitamin D deficiency 05/26/2011  . Vitamin B12 deficiency 05/26/2011  . Overweight(278.02) 01/26/2011  . Chronic back pain 08/10/2010  . Depression 08/10/2010  . History of migraine headaches 08/10/2010  . Preventative health care 08/10/2010    Past Surgical History:  Procedure Laterality Date  . BREAST SURGERY  1999   reduction  . ENDOMETRIAL ABLATION  2002  . KIDNEY STONE SURGERY  2005  . LAPAROSCOPIC VAGINAL HYSTERECTOMY  12/02/07   for DUB     OB History    Gravida Para Term Preterm AB Living   1 1           SAB TAB Ectopic Multiple Live Births                   Home Medications    Prior to Admission medications   Medication Sig Start Date End Date Taking? Authorizing Provider  ALPRAZolam Duanne Moron) 1 MG tablet TAKE ONE TABLET TWICE DAILY AS NEEDED FOR ANXIETY 06/21/16   McGowen, Adrian Blackwater, MD  celecoxib (CELEBREX) 200 MG capsule TAKE ONE CAPSULE BY MOUTH TWICE DAILY AS NEEDED 07/14/16   McGowen, Adrian Blackwater, MD  citalopram (CELEXA) 40 MG tablet TAKE 1 TABLE EACH DAY 12/07/15   McGowen, Adrian Blackwater, MD  colchicine 0.6 MG tablet 2 tabs po at onset of gout pain, then 1 tab po one hour later 01/15/15   Tammi Sou, MD  Cyanocobalamin (VITAMIN B-12 PO) Take 1 tablet by mouth daily. Reported on 04/07/2015    [provider]  eletriptan (RELPAX) 40 MG tablet 1 tab po qd prn, may repeat in 2 hours if HA not significantly improved (max dose per 24h is 80 mg). 04/24/16   McGowen, Adrian Blackwater, MD  HYDROcodone-acetaminophen (NORCO/VICODIN) 5-325 MG tablet Take 1 tablet by mouth every 6 (six) hours as needed. 08/17/16   Shawnie Dapper  H, MD  lisdexamfetamine (VYVANSE) 50 MG capsule Take 1 capsule (50 mg total) by mouth daily. 08/17/16   Tammi Sou, MD  pantoprazole (PROTONIX) 40 MG tablet TAKE 1 TABLET EACH DAY 12/07/15   McGowen, Adrian Blackwater, MD  predniSONE (DELTASONE) 5 MG tablet  04/13/16   [provider]    Family History Family History  Problem Relation Age of Onset  . Cancer Mother        brain ca (glioblastoma)    Social History Social History  Substance Use Topics  . Smoking status: Never Smoker  . Smokeless tobacco: Never Used  . Alcohol use No     Allergies   Patient has no known allergies.   Review of Systems Review of Systems ROS: Statement: All systems negative except as marked or noted in the HPI; Constitutional: Negative for fever and chills. ; ; Eyes: Negative for eye pain, redness and  discharge. ; ; ENMT: Negative for ear pain, hoarseness, nasal congestion, sinus pressure and sore throat. ; ; Cardiovascular: Negative for chest pain, palpitations, diaphoresis, dyspnea and peripheral edema. ; ; Respiratory: Negative for cough, wheezing and stridor. ; ; Gastrointestinal: +N/V/D. Negative for abdominal pain, blood in stool, hematemesis, jaundice and rectal bleeding. . ; ; Genitourinary: Negative for dysuria and hematuria. ; ; Musculoskeletal: +LBP. Negative for neck pain. Negative for swelling and trauma.; ; Skin: Negative for pruritus, rash, abrasions, blisters, bruising and skin lesion.; ; Neuro: Negative for headache, lightheadedness and neck stiffness. Negative for weakness, altered level of consciousness, altered mental status, extremity weakness, paresthesias, involuntary movement, seizure and syncope.       Physical Exam Updated Vital Signs BP 135/80   Pulse 84   Temp 98.6 F (37 C)   Resp 18   Ht 5\' 4"  (1.626 m)   Wt 71.2 kg (157 lb)   SpO2 97%   BMI 26.95 kg/m   Physical Exam 1950: Physical examination:  Nursing notes reviewed; Vital signs and O2 SAT reviewed;  Constitutional: Well developed, Well nourished, Well hydrated, In no acute distress; Head:  Normocephalic, atraumatic; Eyes: EOMI, PERRL, No scleral icterus; ENMT: Mouth and pharynx normal, Mucous membranes moist; Neck: Supple, Full range of motion, No lymphadenopathy; Cardiovascular: Regular rate and rhythm, No gallop; Respiratory: Breath sounds clear & equal bilaterally, No wheezes.  Speaking full sentences with ease, Normal respiratory effort/excursion; Chest: Nontender, Movement normal; Abdomen: Soft, Nontender, Nondistended, Normal bowel sounds; Genitourinary: No CVA tenderness; Spine:  No midline CS, TS, LS tenderness. +TTP left lumbar paraspinal muscles.;;; Extremities: Pulses normal, No tenderness, No edema, No calf edema or asymmetry.; Neuro: AA&Ox3, Major CN grossly intact.  Speech clear. No gross focal  motor or sensory deficits in extremities.; Skin: Color normal, Warm, Dry.   ED Treatments / Results  Labs (all labs ordered are listed, but only abnormal results are displayed)   EKG  EKG Interpretation None       Radiology   Procedures Procedures (including critical care time)  Medications Ordered in ED Medications  ondansetron (ZOFRAN-ODT) disintegrating tablet 8 mg (not administered)  morphine 4 MG/ML injection 4 mg (not administered)     Initial Impression / Assessment and Plan / ED Course  I have reviewed the triage vital signs and the nursing notes.  Pertinent labs & imaging results that were available during my care of the patient were reviewed by me and considered in my medical decision making (see chart for details).  MDM Reviewed: previous chart, nursing note and vitals Reviewed previous:  labs Interpretation: labs and CT scan   Results for orders placed or performed during the hospital encounter of 08/21/16  Urinalysis, Routine w reflex microscopic- may I&O cath if menses  Result Value Ref Range   Color, Urine YELLOW YELLOW   APPearance CLEAR CLEAR   Specific Gravity, Urine <1.005 (L) 1.005 - 1.030   pH 8.0 5.0 - 8.0   Glucose, UA NEGATIVE NEGATIVE mg/dL   Hgb urine dipstick NEGATIVE NEGATIVE   Bilirubin Urine NEGATIVE NEGATIVE   Ketones, ur NEGATIVE NEGATIVE mg/dL   Protein, ur 30 (A) NEGATIVE mg/dL   Nitrite NEGATIVE NEGATIVE   Leukocytes, UA NEGATIVE NEGATIVE  Urinalysis, Microscopic (reflex)  Result Value Ref Range   RBC / HPF NONE SEEN 0 - 5 RBC/hpf   WBC, UA 0-5 0 - 5 WBC/hpf   Bacteria, UA RARE (A) NONE SEEN   Squamous Epithelial / LPF 0-5 (A) NONE SEEN   Urine-Other LESS THAN 10 mL OF URINE SUBMITTED    Ct Renal Stone Study Result Date: 08/21/2016 CLINICAL DATA:  Bilateral flank pain for 2 days, nausea, vomiting and diarrhea. History of kidney stones, chronic back pain. EXAM: CT ABDOMEN AND PELVIS WITHOUT CONTRAST TECHNIQUE: Multidetector  CT imaging of the abdomen and pelvis was performed following the standard protocol without IV contrast. COMPARISON:  Abdominal radiograph Jun 11, 2012 FINDINGS: LOWER CHEST: RIGHT lower lobe bandlike atelectasis. 6 mm pulmonary nodule LEFT lower lobe, lateral segment (series 4, image 14/23). The visualized heart size is normal. No pericardial effusion. HEPATOBILIARY: Normal. PANCREAS: Normal. SPLEEN: Normal. ADRENALS/URINARY TRACT: Kidneys are orthotopic, demonstrating normal size and morphology. 3 mm LEFT lower pole nephrolithiasis. No hydronephrosis; limited assessment for renal masses on this nonenhanced examination. The unopacified ureters are normal in course and caliber. Urinary bladder is decompressed and unremarkable. Normal adrenal glands. STOMACH/BOWEL: The stomach, small and large bowel are normal in course and caliber without inflammatory changes, sensitivity decreased by lack of enteric contrast. Mild descending and sigmoid colonic diverticulosis. Normal appendix. VASCULAR/LYMPHATIC: Aortoiliac vessels are normal in course and caliber. Mild aortic atherosclerosis. No lymphadenopathy by CT size criteria. REPRODUCTIVE: Status post hysterectomy. OTHER: No intraperitoneal free fluid or free air. MUSCULOSKELETAL: Non-acute. Small fat containing supraumbilical ventral hernia. Dextroscoliosis mid lumbar spine associated with hemivertebra. IMPRESSION: 1. 3 mm nonobstructing LEFT nephrolithiasis. 2. **An incidental finding of potential clinical significance has been found. 6 mm LEFT lower lobe pulmonary nodule. Recommend a CT chest at 6-12 months.** Aortic Atherosclerosis (ICD10-I70.0). Electronically Signed   By: Elon Alas M.D.   On: 08/21/2016 20:29    2045:  Workup reassuring. Has tol PO well while in the ED without N/V. No stooling while in the ED. 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 Dx and testing d/w  pt.  Questions answered.  Verb understanding, agreeable to d/c home with outpt f/u.   Final Clinical Impressions(s) / ED Diagnoses   Final diagnoses:  None    New Prescriptions New Prescriptions   No medications on file     Francine Graven, DO 08/25/16 [id number]

## 2016-08-21 NOTE — ED Notes (Signed)
Advised pt not to drive after receiving Morphine. Pt verbalized understanding.

## 2016-09-02 ENCOUNTER — Other Ambulatory Visit: Payer: Self-pay | Admitting: Family Medicine

## 2016-09-15 ENCOUNTER — Other Ambulatory Visit: Payer: Self-pay | Admitting: Family Medicine

## 2016-09-15 ENCOUNTER — Encounter: Payer: 59 | Admitting: Family Medicine

## 2016-09-15 NOTE — Progress Notes (Deleted)
Office Note 09/15/2016  CC: No chief complaint on file.   HPI:  Jacqueline Vance is a 56 y.o. White female who is here for annual health maintenance exam. GYN MD is Dr. Radene Knee: mammogram was ordered by Dr. Radene Knee 03/2016.   Past Medical History:  Diagnosis Date  . Adult ADHD   . Anxiety   . Chronic back pain    neck, too.  MRI L spine 05/2008: 40 degrees dextroscoliosis due to a hemivertebra of L3.  Some DDD/spondylosis w/out spinal stenosis.  . Depression   . Gout    2 attacks total as of 12/2014.  Marland Kitchen Kidney stones 2005; 2017  . Migraine    Proph tx with topamax helps, abortive tx with relpax helps    Past Surgical History:  Procedure Laterality Date  . BREAST SURGERY  1999   reduction  . ENDOMETRIAL ABLATION  2002  . KIDNEY STONE SURGERY  2005  . LAPAROSCOPIC VAGINAL HYSTERECTOMY  12/02/07   for DUB    Family History  Problem Relation Age of Onset  . Cancer Mother        brain ca (glioblastoma)    Social History   Social History  . Marital status: Married    Spouse name: N/A  . Number of children: N/A  . Years of education: N/A   Occupational History  . Not on file.   Social History Main Topics  . Smoking status: Never Smoker  . Smokeless tobacco: Never Used  . Alcohol use No  . Drug use: No  . Sexual activity: Not on file   Other Topics Concern  . Not on file   Social History Narrative   Married, one adult son.  Lives in [place], [place].   Works for [employer] in [place].    Exercise: walks 3 miles per day.   No T/A/Ds.                   Outpatient Medications Prior to Visit  Medication Sig Dispense Refill  . ALPRAZolam (XANAX) 1 MG tablet TAKE ONE TABLET TWICE DAILY AS NEEDED FOR ANXIETY 180 tablet 1  . celecoxib (CELEBREX) 200 MG capsule TAKE ONE CAPSULE BY MOUTH TWICE DAILY AS NEEDED 60 capsule 2  . citalopram (CELEXA) 40 MG tablet TAKE 1 TABLE EACH DAY 90 tablet 3  . colchicine 0.6 MG tablet 2 tabs po at onset of  gout pain, then 1 tab po one hour later 15 tablet 2  . Cyanocobalamin (VITAMIN B-12 PO) Take 1 tablet by mouth daily. Reported on 04/07/2015    . eletriptan (RELPAX) 40 MG tablet 1 tab po qd prn, may repeat in 2 hours if HA not significantly improved (max dose per 24h is 80 mg). 30 tablet 2  . HYDROcodone-acetaminophen (NORCO/VICODIN) 5-325 MG tablet Take 1 tablet by mouth every 6 (six) hours as needed. 90 tablet 0  . lisdexamfetamine (VYVANSE) 50 MG capsule Take 1 capsule (50 mg total) by mouth daily. 30 capsule 0  . methocarbamol (ROBAXIN) 500 MG tablet Take 2 tablets (1,000 mg total) by mouth 4 (four) times daily as needed for muscle spasms (muscle spasm/pain). 25 tablet 0  . pantoprazole (PROTONIX) 40 MG tablet TAKE 1 TABLET EACH DAY 90 tablet 3  . predniSONE (DELTASONE) 5 MG tablet      No facility-administered medications prior to visit.     No Known Allergies  ROS *** PE; There were no vitals taken for this visit. *** Pertinent labs:  Lab  Results  Component Value Date   TSH 1.99 09/14/2015   Lab Results  Component Value Date   WBC 5.3 09/14/2015   HGB 14.2 09/14/2015   HCT 42.0 09/14/2015   MCV 87.6 09/14/2015   PLT 209.0 09/14/2015   Lab Results  Component Value Date   CREATININE 0.79 09/14/2015   BUN 10 09/14/2015   NA 140 09/14/2015   K 4.2 09/14/2015   CL 105 09/14/2015   CO2 30 09/14/2015   Lab Results  Component Value Date   ALT 19 09/14/2015   AST 20 09/14/2015   ALKPHOS 87 09/14/2015   BILITOT 0.4 09/14/2015   Lab Results  Component Value Date   CHOL 197 09/14/2015   Lab Results  Component Value Date   HDL 44.70 09/14/2015   Lab Results  Component Value Date   LDLCALC 129 (H) 09/14/2015   Lab Results  Component Value Date   TRIG 114.0 09/14/2015   Lab Results  Component Value Date   CHOLHDL 4 09/14/2015    ASSESSMENT AND PLAN:   Health maintenance exam: Reviewed age and gender appropriate health maintenance issues (prudent diet,  regular exercise, health risks of tobacco and excessive alcohol, use of seatbelts, fire alarms in home, use of sunscreen).  Also reviewed age and gender appropriate health screening as well as vaccine recommendations. Vaccines: UTD. Labs: fasting HP Colon cancer screening: Cervical ca screening: Breast ca screening:  Hydrocodone RFs and vyvanse RFs.  An After Visit Summary was printed and given to the patient.  FOLLOW UP:  No Follow-up on file.  Signed:  Crissie Sickles, MD           09/15/2016

## 2016-09-20 ENCOUNTER — Encounter: Payer: Self-pay | Admitting: Family Medicine

## 2016-09-22 ENCOUNTER — Encounter: Payer: Self-pay | Admitting: Family Medicine

## 2016-09-22 ENCOUNTER — Encounter: Payer: Self-pay | Admitting: *Deleted

## 2016-09-22 ENCOUNTER — Ambulatory Visit (INDEPENDENT_AMBULATORY_CARE_PROVIDER_SITE_OTHER): Payer: 59 | Admitting: Family Medicine

## 2016-09-22 VITALS — BP 117/78 | HR 73 | Temp 97.8°F | Resp 16 | Ht 64.0 in | Wt 155.2 lb

## 2016-09-22 DIAGNOSIS — F988 Other specified behavioral and emotional disorders with onset usually occurring in childhood and adolescence: Secondary | ICD-10-CM | POA: Diagnosis not present

## 2016-09-22 DIAGNOSIS — R911 Solitary pulmonary nodule: Secondary | ICD-10-CM

## 2016-09-22 DIAGNOSIS — Z23 Encounter for immunization: Secondary | ICD-10-CM | POA: Diagnosis not present

## 2016-09-22 DIAGNOSIS — R918 Other nonspecific abnormal finding of lung field: Secondary | ICD-10-CM | POA: Diagnosis not present

## 2016-09-22 DIAGNOSIS — G894 Chronic pain syndrome: Secondary | ICD-10-CM

## 2016-09-22 MED ORDER — CELECOXIB 200 MG PO CAPS: 200.0000 mg | ORAL_CAPSULE | Freq: Two times a day (BID) | ORAL | 6 refills | Status: DC | PRN

## 2016-09-22 MED ORDER — HYDROCODONE-ACETAMINOPHEN 5-325 MG PO TABS: ORAL_TABLET | ORAL | 0 refills | Status: DC

## 2016-09-22 MED ORDER — ALPRAZOLAM 1 MG PO TABS: ORAL_TABLET | ORAL | 1 refills | Status: DC

## 2016-09-22 MED ORDER — METHOCARBAMOL 500 MG PO TABS: ORAL_TABLET | ORAL | 2 refills | Status: DC

## 2016-09-22 MED ORDER — LISDEXAMFETAMINE DIMESYLATE 50 MG PO CAPS: 50.0000 mg | ORAL_CAPSULE | Freq: Every day | ORAL | 0 refills | Status: DC

## 2016-09-22 NOTE — Progress Notes (Signed)
OFFICE VISIT  09/22/2016   CC:  Chief Complaint  Patient presents with  . Follow-up    [facility]   HPI:    Patient is a 56 y.o. Caucasian female who presents for 5 mo f/u chronic pain syndrome, adult ADD. She has chronic neck and low back pain due to DDD/spondylosis + scoliosis (and various musculoskeletal pains considered to be osteoarthritis) for which I rx narcotic pain medication to optimize functioning and quality of life.  She is a nanny now, has been much more active, has lost 16 lbs in last 5 mo. The extra lifting of the children has caused "havoc" on her back.  Indication for chronic opioid: see above. Medication and dose: Vicodin 5/325, 1 tid prn. # pills per month: 90 Last UDS date: 05/25/16 (appropriate results) Pain contract signed (Y/N): Yes--updated today. Date narcotic database last reviewed (include red flags): today, no red flags.  Says she must take 3 vicodin tabs per day.  She is not ready to consider surgery, no we decided against returning to her orthopedist.  She says robaxin that the ED MD rx'd her for her muscle pain dx at ED helped.  Doing well on vyvanse 50mg  qd--motivation/focus/concentration very good. NO side effects.  Went to ED 08/21/16 for flank pain. CT stone study 08/21/16 in ED: IMPRESSION: 1. 3 mm nonobstructing LEFT nephrolithiasis. 2. **An incidental finding of potential clinical significance has been found. 6 mm LEFT lower lobe pulmonary nodule. Recommend a CT chest at 6-12 months.** Aortic Atherosclerosis   Pt is a never smoker, but grew up around many smokers in her home.  Also, mom had brain cancer, GM had breast cancer, and maternal aunt and a cousin had breast cancer.   Past Medical History:  Diagnosis Date  . Adult ADHD   . Anxiety   . Chronic back pain    neck, too.  MRI L spine 05/2008: 40 degrees dextroscoliosis due to a hemivertebra of L3.  Some DDD/spondylosis w/out spinal stenosis.  . Depression   . Gout    2 attacks total as of  12/2014.  Marland Kitchen Kidney stones 2005; 2017   Most recent CT 08/2016 showed <2 mm nonobstructing nephrolithiasis.  . Migraine    Proph tx with topamax helps, abortive tx with relpax helps.  Couldn't afford topamax so d/c'd this 04/2016.    . Pulmonary nodule 08/2016   6 mm LLL pulm nodule---repeat CT noncontrast recommended 6-12 mo.  Marland Kitchen Restless legs syndrome    Pt can get to sleep fine so she declines med for this as of 08/2016    Past Surgical History:  Procedure Laterality Date  . BREAST SURGERY  1999   reduction  . ENDOMETRIAL ABLATION  2002  . KIDNEY STONE SURGERY  2005  . LAPAROSCOPIC VAGINAL HYSTERECTOMY  12/02/07   for DUB    Outpatient Medications Prior to Visit  Medication Sig Dispense Refill  . citalopram (CELEXA) 40 MG tablet TAKE 1 TABLE EACH DAY 90 tablet 3  . colchicine 0.6 MG tablet 2 tabs po at onset of gout pain, then 1 tab po one hour later 15 tablet 2  . Cyanocobalamin (VITAMIN B-12 PO) Take 1 tablet by mouth daily. Reported on 04/07/2015    . eletriptan (RELPAX) 40 MG tablet 1 tab po qd prn, may repeat in 2 hours if HA not significantly improved (max dose per 24h is 80 mg). 30 tablet 2  . pantoprazole (PROTONIX) 40 MG tablet TAKE 1 TABLET EACH DAY 90 tablet 3  .  SUMAtriptan (IMITREX) 100 MG tablet TAKE ONE TABLET AT ONSET OF HEADACHE. MAY REPEAT IN TWO HOURS IF HEADACHE IS NOT 50% IMPROVED. 5 tablet 0  . ALPRAZolam (XANAX) 1 MG tablet TAKE ONE TABLET TWICE DAILY AS NEEDED FOR ANXIETY 180 tablet 1  . celecoxib (CELEBREX) 200 MG capsule TAKE ONE CAPSULE BY MOUTH TWICE DAILY AS NEEDED 60 capsule 2  . HYDROcodone-acetaminophen (NORCO/VICODIN) 5-325 MG tablet Take 1 tablet by mouth every 6 (six) hours as needed. 90 tablet 0  . lisdexamfetamine (VYVANSE) 50 MG capsule Take 1 capsule (50 mg total) by mouth daily. 30 capsule 0  . methocarbamol (ROBAXIN) 500 MG tablet Take 2 tablets (1,000 mg total) by mouth 4 (four) times daily as needed for muscle spasms (muscle spasm/pain).  (Patient not taking: Reported on 09/22/2016) 25 tablet 0  . predniSONE (DELTASONE) 5 MG tablet      No facility-administered medications prior to visit.     No Known Allergies  ROS As per HPI  PE: Blood pressure 117/78, pulse 73, temperature 97.8 F (36.6 C), temperature source Oral, resp. rate 16, height 5\' 4"  (1.626 m), weight 155 lb 4 oz (70.4 kg), SpO2 97 %. Gen: Alert, well appearing.  Patient is oriented to person, place, time, and situation. AFFECT: pleasant, lucid thought and speech. CV: RRR, no m/r/g.   LUNGS: CTA bilat, nonlabored resps, good aeration in all lung fields. EXT: no clubbing, cyanosis, or edema.  DP and PT pulses 1+ bilat.  LABS:  None today  IMPRESSION AND PLAN:  1) Chronic pain syndrome:  Stable for the most part. I printed rx's for vicodin 5/325, 1 tid prn, #90 today for this month, Oct, and Nov 2018.  Appropriate fill on/after date was noted on each rx.  2) Adult ADD: The current medical regimen is effective;  continue present plan and medications. I printed rx's for vyvanse 50mg , 1 qd, #30 today for this month, Oct, and Nov 2018.  Appropriate fill on/after date was noted on each rx.  3) Musculoskeletal pain/strain: continue celebrex 200 mg bid and I rx'd robaxin 500mg , 1-2 q6h h prn.  4) Pulm nodule in LLL--incidental finding on abd imaging.  She grew up around smokers in her home, has strong FH of other types of cancer.  The remainder of her lungs did not get imaged. I will order noncontrast chest CT for 6 mo from now.  5) Nephrolithiasis; nonobstructing 1.3 mm.  An After Visit Summary was printed and given to the patient.  FOLLOW UP: Return for keep appt set for 09/27/16.  Signed:  Crissie Sickles, MD           09/22/2016

## 2016-09-22 NOTE — Addendum Note (Signed)
Addended by: Onalee Hua on: 09/22/2016 12:44 PM   Modules accepted: Orders

## 2016-09-27 ENCOUNTER — Other Ambulatory Visit: Payer: Self-pay | Admitting: Family Medicine

## 2016-09-27 ENCOUNTER — Encounter: Payer: Self-pay | Admitting: Family Medicine

## 2016-09-27 ENCOUNTER — Ambulatory Visit (INDEPENDENT_AMBULATORY_CARE_PROVIDER_SITE_OTHER): Payer: 59 | Admitting: Family Medicine

## 2016-09-27 VITALS — BP 101/69 | HR 58 | Temp 97.6°F | Resp 16 | Ht 64.0 in | Wt 155.5 lb

## 2016-09-27 DIAGNOSIS — Z1239 Encounter for other screening for malignant neoplasm of breast: Secondary | ICD-10-CM

## 2016-09-27 DIAGNOSIS — Z0000 Encounter for general adult medical examination without abnormal findings: Secondary | ICD-10-CM

## 2016-09-27 DIAGNOSIS — Z1211 Encounter for screening for malignant neoplasm of colon: Secondary | ICD-10-CM

## 2016-09-27 DIAGNOSIS — R918 Other nonspecific abnormal finding of lung field: Secondary | ICD-10-CM

## 2016-09-27 DIAGNOSIS — Z1231 Encounter for screening mammogram for malignant neoplasm of breast: Secondary | ICD-10-CM

## 2016-09-27 LAB — CBC WITH DIFFERENTIAL/PLATELET
Basophils Absolute: 0.1 10*3/uL (ref 0.0–0.1)
Basophils Relative: 1.2 % (ref 0.0–3.0)
Eosinophils Absolute: 0.1 10*3/uL (ref 0.0–0.7)
Eosinophils Relative: 3 % (ref 0.0–5.0)
HCT: 42.7 % (ref 36.0–46.0)
Hemoglobin: 13.7 g/dL (ref 12.0–15.0)
Lymphocytes Relative: 38.2 % (ref 12.0–46.0)
Lymphs Abs: 1.8 10*3/uL (ref 0.7–4.0)
MCHC: 32.2 g/dL (ref 30.0–36.0)
MCV: 90.2 fl (ref 78.0–100.0)
Monocytes Absolute: 0.4 10*3/uL (ref 0.1–1.0)
Monocytes Relative: 9.1 % (ref 3.0–12.0)
Neutro Abs: 2.3 10*3/uL (ref 1.4–7.7)
Neutrophils Relative %: 48.5 % (ref 43.0–77.0)
Platelets: 224 10*3/uL (ref 150.0–400.0)
RBC: 4.73 Mil/uL (ref 3.87–5.11)
RDW: 13.3 % (ref 11.5–15.5)
WBC: 4.6 10*3/uL (ref 4.0–10.5)

## 2016-09-27 LAB — COMPREHENSIVE METABOLIC PANEL
ALT: 15 U/L (ref 0–35)
AST: 19 U/L (ref 0–37)
Albumin: 4 g/dL (ref 3.5–5.2)
Alkaline Phosphatase: 71 U/L (ref 39–117)
BUN: 14 mg/dL (ref 6–23)
CO2: 28 mEq/L (ref 19–32)
Calcium: 9.6 mg/dL (ref 8.4–10.5)
Chloride: 106 mEq/L (ref 96–112)
Creatinine, Ser: 0.89 mg/dL (ref 0.40–1.20)
GFR: 70.18 mL/min (ref >60.00)
Glucose, Bld: 91 mg/dL (ref 70–99)
Potassium: 5.3 mEq/L — ABNORMAL HIGH (ref 3.5–5.1)
Sodium: 141 mEq/L (ref 135–145)
Total Bilirubin: 0.4 mg/dL (ref 0.2–1.2)
Total Protein: 6.1 g/dL (ref 6.0–8.3)

## 2016-09-27 LAB — TSH: TSH: 0.93 u[IU]/mL (ref 0.35–4.50)

## 2016-09-27 LAB — LIPID PANEL
Cholesterol: 162 mg/dL (ref 0–200)
HDL: 48.3 mg/dL (ref >39.00)
LDL Cholesterol: 94 mg/dL (ref 0–99)
NonHDL: 113.75
Total CHOL/HDL Ratio: 3
Triglycerides: 101 mg/dL (ref 0.0–149.0)
VLDL: 20.2 mg/dL (ref 0.0–40.0)

## 2016-09-27 NOTE — Progress Notes (Signed)
Office Note 09/27/2016  CC:  Chief Complaint  Patient presents with  . Annual Exam    Pt is fasting.     HPI:  Jacqueline Vance is a 56 y.o. White female who is here for annual health maintenance exam. Her OB/GYN is Dr. Radene Knee.  Per EMR review it appears Dr. Radene Knee ordered a screening mammogram back in March this year.  Eyes: exam 05/2016. Dental: preventatives 05/2016. Exercise: 7 d/week, 30 min walking. Diet: trying to watch calories and drinking more water.   Past Medical History:  Diagnosis Date  . Adult ADHD   . Anxiety   . Chronic back pain    neck, too.  MRI L spine 05/2008: 40 degrees dextroscoliosis due to a hemivertebra of L3.  Some DDD/spondylosis w/out spinal stenosis.  . Depression   . Gout    2 attacks total as of 12/2014.  Marland Kitchen Kidney stones 2005; 2017   Most recent CT 08/2016 showed <2 mm nonobstructing nephrolithiasis.  . Migraine    Proph tx with topamax helps, abortive tx with relpax helps.  Couldn't afford topamax so d/c'd this 04/2016.    . Pulmonary nodule 08/2016   6 mm LLL pulm nodule---repeat CT noncontrast recommended 6-12 mo.  Marland Kitchen Restless legs syndrome    Pt can get to sleep fine so she declines med for this as of 08/2016    Past Surgical History:  Procedure Laterality Date  . BREAST SURGERY  1999   reduction  . ENDOMETRIAL ABLATION  2002  . KIDNEY STONE SURGERY  2005  . LAPAROSCOPIC VAGINAL HYSTERECTOMY  12/02/07   for DUB    Family History  Problem Relation Age of Onset  . Cancer Mother        brain ca (glioblastoma)    Social History   Social History  . Marital status: Married    Spouse name: N/A  . Number of children: N/A  . Years of education: N/A   Occupational History  . Not on file.   Social History Main Topics  . Smoking status: Never Smoker  . Smokeless tobacco: Never Used  . Alcohol use No  . Drug use: No  . Sexual activity: Not on file   Other Topics Concern  . Not on file   Social History Narrative   Married,  one adult son.  Lives in [place], [place].   Works for [employer] in [place].    Exercise: walks 3 miles per day.   No T/A/Ds.                   Outpatient Medications Prior to Visit  Medication Sig Dispense Refill  . ALPRAZolam (XANAX) 1 MG tablet TAKE ONE TABLET TWICE DAILY AS NEEDED FOR ANXIETY 180 tablet 1  . celecoxib (CELEBREX) 200 MG capsule Take 1 capsule (200 mg total) by mouth 2 (two) times daily as needed. 60 capsule 6  . citalopram (CELEXA) 40 MG tablet TAKE 1 TABLE EACH DAY 90 tablet 3  . colchicine 0.6 MG tablet 2 tabs po at onset of gout pain, then 1 tab po one hour later 15 tablet 2  . Cyanocobalamin (VITAMIN B-12 PO) Take 1 tablet by mouth daily. Reported on 04/07/2015    . eletriptan (RELPAX) 40 MG tablet 1 tab po qd prn, may repeat in 2 hours if HA not significantly improved (max dose per 24h is 80 mg). 30 tablet 2  . HYDROcodone-acetaminophen (NORCO/VICODIN) 5-325 MG tablet 1 tab tid prn 90 tablet 0  .  lisdexamfetamine (VYVANSE) 50 MG capsule Take 1 capsule (50 mg total) by mouth daily. 30 capsule 0  . methocarbamol (ROBAXIN) 500 MG tablet 1-2 tabs po q6h prn for muscle relaxation 30 tablet 2  . pantoprazole (PROTONIX) 40 MG tablet TAKE 1 TABLET EACH DAY 90 tablet 3  . SUMAtriptan (IMITREX) 100 MG tablet TAKE ONE TABLET AT ONSET OF HEADACHE. MAY REPEAT IN TWO HOURS IF HEADACHE IS NOT 50% IMPROVED. 5 tablet 0   No facility-administered medications prior to visit.     No Known Allergies  ROS Review of Systems  Constitutional: Negative for appetite change, chills, fatigue and fever.  HENT: Negative for congestion, dental problem, ear pain and sore throat.   Eyes: Negative for discharge, redness and visual disturbance.  Respiratory: Negative for cough, chest tightness, shortness of breath and wheezing.   Cardiovascular: Negative for chest pain, palpitations and leg swelling.  Gastrointestinal: Negative for abdominal pain, blood in stool,  diarrhea, nausea and vomiting.  Genitourinary: Negative for difficulty urinating, dysuria, flank pain, frequency, hematuria and urgency.  Musculoskeletal: Negative for arthralgias, back pain, joint swelling, myalgias and neck stiffness.  Skin: Negative for pallor and rash.  Neurological: Negative for dizziness, speech difficulty, weakness and headaches.  Hematological: Negative for adenopathy. Does not bruise/bleed easily.  Psychiatric/Behavioral: Negative for confusion and sleep disturbance. The patient is not nervous/anxious.     PE; Blood pressure 101/69, pulse (!) 58, temperature 97.6 F (36.4 C), temperature source Oral, resp. rate 16, height 5\' 4"  (1.626 m), weight 155 lb 8 oz (70.5 kg), SpO2 97 %. Body mass index is 26.69 kg/m. Pt examined with Sharen Hones, CMA, as chaperone.  Gen: Alert, well appearing.  Patient is oriented to person, place, time, and situation. AFFECT: pleasant, lucid thought and speech. ENT: Ears: EACs clear, normal epithelium.  TMs with good light reflex and landmarks bilaterally.  Eyes: no injection, icteris, swelling, or exudate.  EOMI, PERRLA. Nose: no drainage or turbinate edema/swelling.  No injection or focal lesion.  Mouth: lips without lesion/swelling.  Oral mucosa pink and moist.  Dentition intact and without obvious caries or gingival swelling.  Oropharynx without erythema, exudate, or swelling.  Neck: supple/nontender.  No LAD, mass, or TM.  Carotid pulses 2+ bilaterally, without bruits. CV: RRR, no m/r/g.   LUNGS: CTA bilat, nonlabored resps, good aeration in all lung fields. ABD: soft, NT, ND, BS normal.  No hepatospenomegaly or mass.  No bruits. EXT: no clubbing, cyanosis, or edema.  Musculoskeletal: no joint swelling, erythema, warmth, or tenderness.  ROM of all joints intact. Skin - no sores or suspicious lesions or rashes or color changes   Pertinent labs:  Lab Results  Component Value Date   TSH 1.99 09/14/2015   Lab Results  Component  Value Date   WBC 5.3 09/14/2015   HGB 14.2 09/14/2015   HCT 42.0 09/14/2015   MCV 87.6 09/14/2015   PLT 209.0 09/14/2015   Lab Results  Component Value Date   CREATININE 0.79 09/14/2015   BUN 10 09/14/2015   NA 140 09/14/2015   K 4.2 09/14/2015   CL 105 09/14/2015   CO2 30 09/14/2015   Lab Results  Component Value Date   ALT 19 09/14/2015   AST 20 09/14/2015   ALKPHOS 87 09/14/2015   BILITOT 0.4 09/14/2015   Lab Results  Component Value Date   CHOL 197 09/14/2015   Lab Results  Component Value Date   HDL 44.70 09/14/2015   Lab Results  Component Value Date  [place] 129 (H) 09/14/2015   Lab Results  Component Value Date   TRIG 114.0 09/14/2015   Lab Results  Component Value Date   CHOLHDL 4 09/14/2015    ASSESSMENT AND PLAN:   Health maintenance exam: Reviewed age and gender appropriate health maintenance issues (prudent diet, regular exercise, health risks of tobacco and excessive alcohol, use of seatbelts, fire alarms in home, use of sunscreen).  Also reviewed age and gender appropriate health screening as well as vaccine recommendations. Vaccines: UTD.   Flu UTD. Labs: fasting HP today. Cervical ca screening: n/a--pt has had hysterectomy for benign reasons (DUB). Breast ca screening: most recent mammogram 07/2013.  Repeat has been ordered--pt to schedule with GI breast center. Colon ca screening: due for colonoscopy, referral ordered for pt today.  Of note, regarding recent incidental finding of small pulm nodule on abd/pelv CT, she has read about these since last visit and says she feels comfortable doing f/u CT chest in 1 yr instead of 6 mo.  An After Visit Summary was printed and given to the patient.  FOLLOW UP:  Return in about 3 months (around 12/27/2016) for routine chronic illness f/u.  Signed:  Crissie Sickles, MD           09/27/2016

## 2016-09-27 NOTE — Patient Instructions (Signed)
Don't forget to schedule your mammogram!   Health Maintenance, Female Adopting a healthy lifestyle and getting preventive care can go a long way to promote health and wellness. Talk with your health care provider about what schedule of regular examinations is right for you. This is a good chance for you to check in with your provider about disease prevention and staying healthy. In between checkups, there are plenty of things you can do on your own. Experts have done a lot of research about which lifestyle changes and preventive measures are most likely to keep you healthy. Ask your health care provider for more information. Weight and diet Eat a healthy diet  Be sure to include plenty of vegetables, fruits, low-fat dairy products, and lean protein.  Do not eat a lot of foods high in solid fats, added sugars, or salt.  Get regular exercise. This is one of the most important things you can do for your health. ? Most adults should exercise for at least 150 minutes each week. The exercise should increase your heart rate and make you sweat (moderate-intensity exercise). ? Most adults should also do strengthening exercises at least twice a week. This is in addition to the moderate-intensity exercise.  Maintain a healthy weight  Body mass index (BMI) is a measurement that can be used to identify possible weight problems. It estimates body fat based on height and weight. Your health care provider can help determine your BMI and help you achieve or maintain a healthy weight.  For females 62 years of age and older: ? A BMI below 18.5 is considered underweight. ? A BMI of 18.5 to 24.9 is normal. ? A BMI of 25 to 29.9 is considered overweight. ? A BMI of 30 and above is considered obese.  Watch levels of cholesterol and blood lipids  You should start having your blood tested for lipids and cholesterol at 54 years of age, then have this test every 5 years.  You may need to have your cholesterol  levels checked more often if: ? Your lipid or cholesterol levels are high. ? You are older than 55 years of age. ? You are at high risk for heart disease.  Cancer screening Lung Cancer  Lung cancer screening is recommended for adults 84-82 years old who are at high risk for lung cancer because of a history of smoking.  A yearly low-dose CT scan of the lungs is recommended for people who: ? Currently smoke. ? Have quit within the past 15 years. ? Have at least a 30-pack-year history of smoking. A pack year is smoking an average of one pack of cigarettes a day for 1 year.  Yearly screening should continue until it has been 15 years since you quit.  Yearly screening should stop if you develop a health problem that would prevent you from having lung cancer treatment.  Breast Cancer  Practice breast self-awareness. This means understanding how your breasts normally appear and feel.  It also means doing regular breast self-exams. Let your health care provider know about any changes, no matter how small.  If you are in your 20s or 30s, you should have a clinical breast exam (CBE) by a health care provider every 1-3 years as part of a regular health exam.  If you are 74 or older, have a CBE every year. Also consider having a breast X-ray (mammogram) every year.  If you have a family history of breast cancer, talk to your health care provider about genetic  screening.  If you are at high risk for breast cancer, talk to your health care provider about having an MRI and a mammogram every year.  Breast cancer gene (BRCA) assessment is recommended for women who have family members with BRCA-related cancers. BRCA-related cancers include: ? Breast. ? Ovarian. ? Tubal. ? Peritoneal cancers.  Results of the assessment will determine the need for genetic counseling and BRCA1 and BRCA2 testing.  Cervical Cancer Your health care provider may recommend that you be screened regularly for cancer of  the pelvic organs (ovaries, uterus, and vagina). This screening involves a pelvic examination, including checking for microscopic changes to the surface of your cervix (Pap test). You may be encouraged to have this screening done every 3 years, beginning at age 21.  For women ages 30-65, health care providers may recommend pelvic exams and Pap testing every 3 years, or they may recommend the Pap and pelvic exam, combined with testing for human papilloma virus (HPV), every 5 years. Some types of HPV increase your risk of cervical cancer. Testing for HPV may also be done on women of any age with unclear Pap test results.  Other health care providers may not recommend any screening for nonpregnant women who are considered low risk for pelvic cancer and who do not have symptoms. Ask your health care provider if a screening pelvic exam is right for you.  If you have had past treatment for cervical cancer or a condition that could lead to cancer, you need Pap tests and screening for cancer for at least 20 years after your treatment. If Pap tests have been discontinued, your risk factors (such as having a new sexual partner) need to be reassessed to determine if screening should resume. Some women have medical problems that increase the chance of getting cervical cancer. In these cases, your health care provider may recommend more frequent screening and Pap tests.  Colorectal Cancer  This type of cancer can be detected and often prevented.  Routine colorectal cancer screening usually begins at 54 years of age and continues through 54 years of age.  Your health care provider may recommend screening at an earlier age if you have risk factors for colon cancer.  Your health care provider may also recommend using home test kits to check for hidden blood in the stool.  A small camera at the end of a tube can be used to examine your colon directly (sigmoidoscopy or colonoscopy). This is done to check for the  earliest forms of colorectal cancer.  Routine screening usually begins at age 50.  Direct examination of the colon should be repeated every 5-10 years through 54 years of age. However, you may need to be screened more often if early forms of precancerous polyps or small growths are found.  Skin Cancer  Check your skin from head to toe regularly.  Tell your health care provider about any new moles or changes in moles, especially if there is a change in a mole's shape or color.  Also tell your health care provider if you have a mole that is larger than the size of a pencil eraser.  Always use sunscreen. Apply sunscreen liberally and repeatedly throughout the day.  Protect yourself by wearing long sleeves, pants, a wide-brimmed hat, and sunglasses whenever you are outside.  Heart disease, diabetes, and high blood pressure  High blood pressure causes heart disease and increases the risk of stroke. High blood pressure is more likely to develop in: ? People who   have blood pressure in the high end of the normal range (130-139/85-89 mm Hg). ? People who are overweight or obese. ? People who are African American.  If you are 19-29 years of age, have your blood pressure checked every 3-5 years. If you are 30 years of age or older, have your blood pressure checked every year. You should have your blood pressure measured twice-once when you are at a hospital or clinic, and once when you are not at a hospital or clinic. Record the average of the two measurements. To check your blood pressure when you are not at a hospital or clinic, you can use: ? An automated blood pressure machine at a pharmacy. ? A home blood pressure monitor.  If you are between 65 years and 63 years old, ask your health care provider if you should take aspirin to prevent strokes.  Have regular diabetes screenings. This involves taking a blood sample to check your fasting blood sugar level. ? If you are at a normal weight and  have a low risk for diabetes, have this test once every three years after 54 years of age. ? If you are overweight and have a high risk for diabetes, consider being tested at a younger age or more often. Preventing infection Hepatitis B  If you have a higher risk for hepatitis B, you should be screened for this virus. You are considered at high risk for hepatitis B if: ? You were born in a country where hepatitis B is common. Ask your health care provider which countries are considered high risk. ? Your parents were born in a high-risk country, and you have not been immunized against hepatitis B (hepatitis B vaccine). ? You have HIV or AIDS. ? You use needles to inject street drugs. ? You live with someone who has hepatitis B. ? You have had sex with someone who has hepatitis B. ? You get hemodialysis treatment. ? You take certain medicines for conditions, including cancer, organ transplantation, and autoimmune conditions.  Hepatitis C  Blood testing is recommended for: ? Everyone born from [birth year] through 1963. ? Anyone with known risk factors for hepatitis C.  Sexually transmitted infections (STIs)  You should be screened for sexually transmitted infections (STIs) including gonorrhea and chlamydia if: ? You are sexually active and are younger than 54 years of age. ? You are older than 54 years of age and your health care provider tells you that you are at risk for this type of infection. ? Your sexual activity has changed since you were last screened and you are at an increased risk for chlamydia or gonorrhea. Ask your health care provider if you are at risk.  If you do not have HIV, but are at risk, it may be recommended that you take a prescription medicine daily to prevent HIV infection. This is called pre-exposure prophylaxis (PrEP). You are considered at risk if: ? You are sexually active and do not regularly use condoms or know the HIV status of your partner(s). ? You take drugs by  injection. ? You are sexually active with a partner who has HIV.  Talk with your health care provider about whether you are at high risk of being infected with HIV. If you choose to begin PrEP, you should first be tested for HIV. You should then be tested every 3 months for as long as you are taking PrEP. Pregnancy  If you are premenopausal and you may become pregnant, ask your health care provider  about preconception counseling.  If you may become pregnant, take 400 to 800 micrograms (mcg) of folic acid every day.  If you want to prevent pregnancy, talk to your health care provider about birth control (contraception). Osteoporosis and menopause  Osteoporosis is a disease in which the bones lose minerals and strength with aging. This can result in serious bone fractures. Your risk for osteoporosis can be identified using a bone density scan.  If you are 6 years of age or older, or if you are at risk for osteoporosis and fractures, ask your health care provider if you should be screened.  Ask your health care provider whether you should take a calcium or vitamin D supplement to lower your risk for osteoporosis.  Menopause may have certain physical symptoms and risks.  Hormone replacement therapy may reduce some of these symptoms and risks. Talk to your health care provider about whether hormone replacement therapy is right for you. Follow these instructions at home:  Schedule regular health, dental, and eye exams.  Stay current with your immunizations.  Do not use any tobacco products including cigarettes, chewing tobacco, or electronic cigarettes.  If you are pregnant, do not drink alcohol.  If you are breastfeeding, limit how much and how often you drink alcohol.  Limit alcohol intake to no more than 1 drink per day for nonpregnant women. One drink equals 12 ounces of beer, 5 ounces of wine, or 1 ounces of hard liquor.  Do not use street drugs.  Do not share needles.  Ask  your health care provider for help if you need support or information about quitting drugs.  Tell your health care provider if you often feel depressed.  Tell your health care provider if you have ever been abused or do not feel safe at home. This information is not intended to replace advice given to you by your health care provider. Make sure you discuss any questions you have with your health care provider. Document Released: 07/18/2010 Document Revised: 06/10/2015 Document Reviewed: 10/06/2014 Elsevier Interactive Patient Education  [employer].

## 2016-10-02 ENCOUNTER — Other Ambulatory Visit: Payer: Self-pay | Admitting: Family Medicine

## 2016-10-02 DIAGNOSIS — Z1231 Encounter for screening mammogram for malignant neoplasm of breast: Secondary | ICD-10-CM

## 2016-10-02 MED ORDER — SUMATRIPTAN SUCCINATE 100 MG PO TABS: ORAL_TABLET | ORAL | 1 refills | Status: DC

## 2016-10-06 ENCOUNTER — Ambulatory Visit
Admission: RE | Admit: 2016-10-06 | Discharge: 2016-10-06 | Disposition: A | Discharge status: Home / Self Care | Payer: 59 | Source: Ambulatory Visit | Attending: Family Medicine | Admitting: Family Medicine

## 2016-10-06 DIAGNOSIS — Z1231 Encounter for screening mammogram for malignant neoplasm of breast: Secondary | ICD-10-CM

## 2016-10-24 ENCOUNTER — Other Ambulatory Visit: Payer: Self-pay | Admitting: Family Medicine

## 2016-10-27 ENCOUNTER — Encounter: Payer: Self-pay | Admitting: Family Medicine

## 2016-11-25 ENCOUNTER — Other Ambulatory Visit: Payer: Self-pay | Admitting: Family Medicine

## 2016-12-18 ENCOUNTER — Encounter: Payer: Self-pay | Admitting: Family Medicine

## 2016-12-18 MED ORDER — LISDEXAMFETAMINE DIMESYLATE 50 MG PO CAPS: 50.0000 mg | ORAL_CAPSULE | Freq: Every day | ORAL | 0 refills | Status: DC

## 2016-12-18 MED ORDER — HYDROCODONE-ACETAMINOPHEN 5-325 MG PO TABS: ORAL_TABLET | ORAL | 0 refills | Status: DC

## 2016-12-18 NOTE — Telephone Encounter (Signed)
RF request for hydro/apap Last written: 09/22/16 #60 w/ 0RF given 3 Rx's (Sept, Oct and Nov)  RF request for Vyvanse Last written: 09/22/16 #30 w/ 0RF given 3Rx's (Sept, Oct and Nov)  Please advise. Thanks.   Pt still has a refill left on last Rx for alprazolam.

## 2016-12-18 NOTE — Telephone Encounter (Signed)
RF request for alprazolam Last written:

## 2016-12-18 NOTE — Telephone Encounter (Signed)
Rx's put up front for p/u. Pt advised and voiced understanding.

## 2016-12-18 NOTE — Telephone Encounter (Signed)
I'll do 1 mo supply of vyvanse and vicodin. Pt needs office f/u for her chronic pain management (q3 mo required as per latest prescribing guidelines that MD's now have to follow when a patient is on pain meds like hers).-thx

## 2016-12-23 ENCOUNTER — Other Ambulatory Visit: Payer: Self-pay | Admitting: Family Medicine

## 2017-01-05 ENCOUNTER — Other Ambulatory Visit: Payer: Self-pay | Admitting: Family Medicine

## 2017-01-19 ENCOUNTER — Encounter: Payer: Self-pay | Admitting: Family Medicine

## 2017-01-19 ENCOUNTER — Ambulatory Visit: Payer: 59 | Admitting: Family Medicine

## 2017-01-19 VITALS — BP 113/68 | HR 68 | Temp 97.8°F | Resp 16 | Ht 64.0 in | Wt 157.8 lb

## 2017-01-19 DIAGNOSIS — G8929 Other chronic pain: Secondary | ICD-10-CM | POA: Diagnosis not present

## 2017-01-19 DIAGNOSIS — F988 Other specified behavioral and emotional disorders with onset usually occurring in childhood and adolescence: Secondary | ICD-10-CM

## 2017-01-19 DIAGNOSIS — F411 Generalized anxiety disorder: Secondary | ICD-10-CM

## 2017-01-19 DIAGNOSIS — E875 Hyperkalemia: Secondary | ICD-10-CM | POA: Diagnosis not present

## 2017-01-19 DIAGNOSIS — M545 Low back pain: Secondary | ICD-10-CM

## 2017-01-19 DIAGNOSIS — F3342 Major depressive disorder, recurrent, in full remission: Secondary | ICD-10-CM

## 2017-01-19 DIAGNOSIS — G894 Chronic pain syndrome: Secondary | ICD-10-CM

## 2017-01-19 LAB — BASIC METABOLIC PANEL
BUN: 9 mg/dL (ref 6–23)
CO2: 30 mEq/L (ref 19–32)
Calcium: 9.2 mg/dL (ref 8.4–10.5)
Chloride: 104 mEq/L (ref 96–112)
Creatinine, Ser: 0.77 mg/dL (ref 0.40–1.20)
GFR: 82.85 mL/min (ref >60.00)
Glucose, Bld: 99 mg/dL (ref 70–99)
Potassium: 3.7 mEq/L (ref 3.5–5.1)
Sodium: 142 mEq/L (ref 135–145)

## 2017-01-19 MED ORDER — LISDEXAMFETAMINE DIMESYLATE 50 MG PO CAPS: 50.0000 mg | ORAL_CAPSULE | Freq: Every day | ORAL | 0 refills | Status: DC

## 2017-01-19 MED ORDER — HYDROCODONE-ACETAMINOPHEN 5-325 MG PO TABS: ORAL_TABLET | ORAL | 0 refills | Status: DC

## 2017-01-19 NOTE — Progress Notes (Signed)
OFFICE VISIT  01/19/2017   CC:  Chief Complaint  Patient presents with  . Follow-up    RCI, pt is not fasting.     HPI:    Patient is a 57 y.o. Caucasian female who presents for 3 mo f/u chronic pain syndrome, adult ADHD, and anxiety/depressive disorder for which she has taken SSRI and benzo long term. Additionally, she had a very mildly elevated potassium at last check 3 mo ago so we'll recheck this today.  Indication for chronic opioid: She has chronic neck and low back pain due to DDD/spondylosis + scoliosis (and various musculoskeletal pains considered to be osteoarthritis) for which I rx narcotic pain medication to optimize functioning and quality of life Medication and dose: Vicodin 5/325, 1 tid prn. # pills per month: 90 Last UDS date: 05/25/16 Pain contract signed (Y/N): Y, 09/22/16 Date narcotic database last reviewed (include red flags): today, no red flags.  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 Takes 3 tabs of vicodin per day, celebrex 1-2 times per day. No adverse side effects.  ADHD: good focus, concentration, organization, motivation, task completion.  MOOD/ANXIETY: stable, taking xanax bid most of the time. No adverse side effects.  She has cut back some on high K foods since her K came back a little elevated last visit.  ROS: no HAs, no abd pains, no n/v, no melena or hematochezia, no palpitations, no rash.  Past Medical History:  Diagnosis Date  . Adult ADHD   . Anxiety   . Chronic back pain    neck, too.  MRI L spine 05/2008: 40 degrees dextroscoliosis due to a hemivertebra of L3.  Some DDD/spondylosis w/out spinal stenosis.  . Depression   . Gout    2 attacks total as of 12/2014.  Marland Kitchen Kidney stones 2005; 2017   Most recent CT 08/2016 showed <2 mm nonobstructing nephrolithiasis.  . Migraine    Proph tx with topamax helps, abortive tx with relpax helps.  Couldn't afford topamax so d/c'd this 04/2016.    .  Pulmonary nodule 08/2016   6 mm LLL pulm nodule---repeat CT noncontrast recommended 6-12 mo.  Marland Kitchen Restless legs syndrome    Pt can get to sleep fine so she declines med for this as of 08/2016    Past Surgical History:  Procedure Laterality Date  . BREAST SURGERY  1999   reduction  . ENDOMETRIAL ABLATION  2002  . KIDNEY STONE SURGERY  2005  . LAPAROSCOPIC VAGINAL HYSTERECTOMY  12/02/07   for DUB  . REDUCTION MAMMAPLASTY      Outpatient Medications Prior to Visit  Medication Sig Dispense Refill  . ALPRAZolam (XANAX) 1 MG tablet TAKE ONE TABLET TWICE DAILY AS NEEDED FOR ANXIETY 180 tablet 1  . celecoxib (CELEBREX) 200 MG capsule Take 1 capsule (200 mg total) by mouth 2 (two) times daily as needed. 60 capsule 6  . citalopram (CELEXA) 40 MG tablet TAKE ONE (1) TABLET EACH DAY 90 tablet 1  . colchicine 0.6 MG tablet 2 tabs po at onset of gout pain, then 1 tab po one hour later 15 tablet 2  . Cyanocobalamin (VITAMIN B-12 PO) Take 1 tablet by mouth daily. Reported on 04/07/2015    . eletriptan (RELPAX) 40 MG tablet 1 tab po qd prn, may repeat in 2 hours if HA not significantly improved (max dose per 24h is 80 mg). 30 tablet 2  . methocarbamol (ROBAXIN) 500 MG tablet  1-2 tabs po q6h prn for muscle relaxation 30 tablet 2  . pantoprazole (PROTONIX) 40 MG tablet TAKE ONE (1) TABLET EACH DAY 90 tablet 3  . SUMAtriptan (IMITREX) 100 MG tablet TAKE ONE TABLET BY MOUTH AT ONSET OF HEADACHE. MAY REPEAT IN TWO HOURS IF HEADACHE IS NOT 50% IMPROVED. 5 tablet 1  . HYDROcodone-acetaminophen (NORCO/VICODIN) 5-325 MG tablet 1 tab tid prn 90 tablet 0  . lisdexamfetamine (VYVANSE) 50 MG capsule Take 1 capsule (50 mg total) by mouth daily. 30 capsule 0  . tretinoin (RETIN-A) 0.025 % cream Apply 1 application topically at bedtime as needed.  99   No facility-administered medications prior to visit.     No Known Allergies  ROS As per HPI  PE: Blood pressure 113/68, pulse 68, temperature 97.8 F (36.6 C),  temperature source Oral, resp. rate 16, height 5\' 4"  (1.626 m), weight 157 lb 12 oz (71.6 kg), SpO2 96 %. Wt Readings from Last 2 Encounters:  01/19/17 157 lb 12 oz (71.6 kg)  09/27/16 155 lb 8 oz (70.5 kg)    Gen: alert, oriented x 4, affect pleasant.  Lucid thinking and conversation noted. HEENT: PERRLA, EOMI.   Neck: no LAD, mass, or thyromegaly. CV: RRR, no m/r/g LUNGS: CTA bilat, nonlabored. NEURO: no tremor or tics noted on observation.  Coordination intact. CN 2-12 grossly intact bilaterally, strength 5/5 in all extremeties.  No ataxia.   LABS:    Chemistry      Component Value Date/Time   NA 141 09/27/2016 1008   K 5.3 (H) 09/27/2016 1008   CL 106 09/27/2016 1008   CO2 28 09/27/2016 1008   BUN 14 09/27/2016 1008   CREATININE 0.89 09/27/2016 1008      Component Value Date/Time   CALCIUM 9.6 09/27/2016 1008   ALKPHOS 71 09/27/2016 1008   AST 19 09/27/2016 1008   ALT 15 09/27/2016 1008   BILITOT 0.4 09/27/2016 1008      IMPRESSION AND PLAN:  1) Chronic pain syndrome: stable. The current medical regimen is effective;  continue present plan and medications. I printed rx's for vicodin 5/325, 1 tid prn, #90 today for this month, Feb 2019, and Mar 2019.  Appropriate fill on/after date was noted on each rx.  2) Adult ADD: The current medical regimen is effective;  continue present plan and medications. I printed rx's for vyvanse 50mg , 1 qAM, #30 today for this month, Feb 2019, and Mar 2019.  Appropriate fill on/after date was noted on each rx.  3) MDD in remission, with GAD: well controlled on longterm citalopram 40mg  qd and xanax 1mg  bid. She does not need new rx for alprazolam today.  4) Hyperkalemia, mild: recheck BMET today.  She has been trying to eat a low potassium diet.  An After Visit Summary was printed and given to the patient.  FOLLOW UP: Return in about 3 months (around 04/19/2017) for f/u pain/add/anx.  Signed:  Crissie Sickles, MD            01/19/2017

## 2017-01-22 ENCOUNTER — Encounter: Payer: Self-pay | Admitting: *Deleted

## 2017-02-20 ENCOUNTER — Telehealth: Payer: Self-pay | Admitting: Family Medicine

## 2017-02-20 ENCOUNTER — Other Ambulatory Visit: Payer: Self-pay | Admitting: Family Medicine

## 2017-02-20 NOTE — Telephone Encounter (Signed)
Copied from [place] [id number]. Topic: Quick Communication - See Telephone Encounter >> Feb 20, 2017  3:17 PM Arletha Grippe wrote: CRM for notification. See Telephone encounter for:   02/20/17. Pharmacy called - they need documentation that provider is aware of pt taking alprazolam and hydrocodone . Please ca;ll melissa back at [facility] [phone]. This is due to new changes with the medication laws

## 2017-02-21 ENCOUNTER — Other Ambulatory Visit: Payer: Self-pay

## 2017-02-21 NOTE — Telephone Encounter (Signed)
Yes, I have talked to patient about this.

## 2017-02-21 NOTE — Telephone Encounter (Signed)
Pharmacist notified and documented information given.

## 2017-02-21 NOTE — Telephone Encounter (Signed)
Jacqueline Vance, [facility] at [place] called to ask for documentation that the provider has talked to the patient about the interactions of taking alprazolam and hydrocodone due to changes with the medication laws. He said someone can call back at [phone] and provide that documentation.

## 2017-03-06 ENCOUNTER — Other Ambulatory Visit: Payer: Self-pay | Admitting: Family Medicine

## 2017-03-07 NOTE — Telephone Encounter (Signed)
[facility]  RF request for methocarbamol LOV: 01/19/17 Next ov: None Last written: 09/22/16 #30 w/ 2RF  Please advise. Thanks.

## 2017-03-09 ENCOUNTER — Ambulatory Visit (HOSPITAL_COMMUNITY): Payer: 59

## 2017-03-26 ENCOUNTER — Telehealth: Payer: Self-pay | Admitting: Family Medicine

## 2017-03-26 NOTE — Telephone Encounter (Signed)
Copied from [place] [id number]. Topic: Quick Communication - See Telephone Encounter >> Mar 26, 2017  9:30 AM Conception Chancy, NT wrote: CRM for notification. See Telephone encounter for:  03/26/17.  [place] is calling states she needs Dr. Anitra Lauth nurse to call her back. She states since she patient is taking ALPRAZolam Duanne Moron) 1 MG tablet And HYDROcodone-acetaminophen (NORCO/VICODIN) 5-325 MG tablet, pharmacy has to document that they spoke with the nurse and Dr. Anitra Lauth is aware the patient is taking both. Please advise.   [place], [place]  [place]  [address]  Phone: [phone] Fax: [phone]

## 2017-03-27 NOTE — Telephone Encounter (Signed)
Advised pharmacy.

## 2017-04-18 ENCOUNTER — Encounter: Payer: Self-pay | Admitting: Family Medicine

## 2017-04-18 ENCOUNTER — Ambulatory Visit: Payer: 59 | Admitting: Family Medicine

## 2017-04-18 VITALS — BP 121/84 | HR 79 | Temp 97.9°F | Resp 16 | Ht 64.0 in | Wt 157.4 lb

## 2017-04-18 DIAGNOSIS — G894 Chronic pain syndrome: Secondary | ICD-10-CM

## 2017-04-18 DIAGNOSIS — M545 Low back pain, unspecified: Secondary | ICD-10-CM

## 2017-04-18 DIAGNOSIS — G43909 Migraine, unspecified, not intractable, without status migrainosus: Secondary | ICD-10-CM | POA: Diagnosis not present

## 2017-04-18 DIAGNOSIS — F3342 Major depressive disorder, recurrent, in full remission: Secondary | ICD-10-CM

## 2017-04-18 DIAGNOSIS — G8929 Other chronic pain: Secondary | ICD-10-CM

## 2017-04-18 DIAGNOSIS — F411 Generalized anxiety disorder: Secondary | ICD-10-CM

## 2017-04-18 DIAGNOSIS — F988 Other specified behavioral and emotional disorders with onset usually occurring in childhood and adolescence: Secondary | ICD-10-CM | POA: Diagnosis not present

## 2017-04-18 MED ORDER — HYDROCODONE-ACETAMINOPHEN 5-325 MG PO TABS: ORAL_TABLET | ORAL | 0 refills | Status: DC

## 2017-04-18 MED ORDER — LISDEXAMFETAMINE DIMESYLATE 50 MG PO CAPS: 50.0000 mg | ORAL_CAPSULE | Freq: Every day | ORAL | 0 refills | Status: DC

## 2017-04-18 MED ORDER — ELETRIPTAN HYDROBROMIDE 40 MG PO TABS: ORAL_TABLET | ORAL | 11 refills | Status: DC

## 2017-04-18 NOTE — Progress Notes (Signed)
OFFICE VISIT  04/18/2017   CC:  Chief Complaint  Patient presents with  . Follow-up    RCI     HPI:    Patient is a 57 y.o. Caucasian female who presents for 3 mo f/u chronic pain syndrome, adult ADD, and anxiety and depressive disorder--she has been taking SSRI and benzo long term for this.  Indication for chronic opioid: She has chronic neck and low back paindue to DDD/spondylosis + scoliosis(and various musculoskeletal pains considered to be osteoarthritis) for which I rx narcotic pain medication to optimize functioning and quality of life Medication and dose: Vicodin 5/325, 1 tid prn, # pills per month: 90 Last UDS date: 05/25/16 Opioid Treatment Agreement signed (Y/N): Y, 09/22/16. Opioid Treatment Agreement last reviewed with patient:  Today [place] reviewed this encounter (include red flags):  yes, no red flags.  Pain:  She saw Levy Pupa at [facility] for left shoulder, and low back after she fell since last visit. X-rays were done--no fractures.  Dx'd with bursitis in L shoulder and cervical and lumbar arthritis.  Pt got trigger point injections in lateral neck (bilat), both subacromial regions, and both trochanteric bursa. These helped, but she still has intermittent low back pain requiring vicodin tid--working hard as a nannie.  ADD: Pt states all is going well with the med at current dosing: much improved focus, concentration, task completion.  Less frustration, better multitasking, less impulsivity and restlessness.  Mood is stable. No side effects from the medication.  Mood/anxiety: doing well.  Compliant with meds.   Past Medical History:  Diagnosis Date  . Adult ADHD   . Anxiety   . Chronic back pain    neck, too.  MRI L spine 05/2008: 40 degrees dextroscoliosis due to a hemivertebra of L3.  Some DDD/spondylosis w/out spinal stenosis.  . Depression   . Gout    2 attacks total as of 12/2014.  Marland Kitchen Kidney stones 2005; 2017   Most recent CT 08/2016 showed <2 mm  nonobstructing nephrolithiasis.  . Migraine    Proph tx with topamax helps, abortive tx with relpax helps.  Couldn't afford topamax so d/c'd this 04/2016.    . Pulmonary nodule 08/2016   6 mm LLL pulm nodule---repeat CT noncontrast recommended 6-12 mo.  Marland Kitchen Restless legs syndrome    Pt can get to sleep fine so she declines med for this as of 08/2016    Past Surgical History:  Procedure Laterality Date  . BREAST SURGERY  1999   reduction  . ENDOMETRIAL ABLATION  2002  . KIDNEY STONE SURGERY  2005  . LAPAROSCOPIC VAGINAL HYSTERECTOMY  12/02/07   for DUB  . REDUCTION MAMMAPLASTY      Outpatient Medications Prior to Visit  Medication Sig Dispense Refill  . ALPRAZolam (XANAX) 1 MG tablet TAKE ONE TABLET BY MOUTH TWICE A DAY AS NEEDED FOR ANXIETY 180 tablet 1  . celecoxib (CELEBREX) 200 MG capsule Take 1 capsule (200 mg total) by mouth 2 (two) times daily as needed. 60 capsule 6  . citalopram (CELEXA) 40 MG tablet TAKE ONE (1) TABLET EACH DAY 90 tablet 1  . colchicine 0.6 MG tablet 2 tabs po at onset of gout pain, then 1 tab po one hour later 15 tablet 2  . Cyanocobalamin (VITAMIN B-12 PO) Take 1 tablet by mouth daily. Reported on 04/07/2015    . methocarbamol (ROBAXIN) 500 MG tablet TAKE ONE TO TWO TABLETS BY MOUTH EVERY SIX HOURS AS NEEDED FOR MUSCLERELAXATION 30 tablet 2  .  pantoprazole (PROTONIX) 40 MG tablet TAKE ONE (1) TABLET EACH DAY 90 tablet 3  . tretinoin (RETIN-A) 0.025 % cream Apply 1 application topically at bedtime as needed.  99  . eletriptan (RELPAX) 40 MG tablet 1 tab po qd prn, may repeat in 2 hours if HA not significantly improved (max dose per 24h is 80 mg). 30 tablet 2  . HYDROcodone-acetaminophen (NORCO/VICODIN) 5-325 MG tablet 1 tab tid prn 90 tablet 0  . lisdexamfetamine (VYVANSE) 50 MG capsule Take 1 capsule (50 mg total) by mouth daily. 30 capsule 0  . SUMAtriptan (IMITREX) 100 MG tablet TAKE ONE TABLET BY MOUTH AT ONSET OF HEADACHE. MAY REPEAT IN TWO HOURS IF  HEADACHE IS NOT 50% IMPROVED. 5 tablet 1   No facility-administered medications prior to visit.     No Known Allergies  ROS As per HPI  PE: Blood pressure 121/84, pulse 79, temperature 97.9 F (36.6 C), temperature source Oral, resp. rate 16, height 5\' 4"  (1.626 m), weight 157 lb 6 oz (71.4 kg), SpO2 94 %. Body mass index is 27.01 kg/m.  Wt Readings from Last 2 Encounters:  04/18/17 157 lb 6 oz (71.4 kg)  01/19/17 157 lb 12 oz (71.6 kg)    Gen: alert, oriented x 4, affect pleasant.  Lucid thinking and conversation noted. HEENT: PERRLA, EOMI.   Neck: no LAD, mass, or thyromegaly. CV: RRR, no m/r/g LUNGS: CTA bilat, nonlabored. NEURO: no tremor or tics noted on observation.  Coordination intact. CN 2-12 grossly intact bilaterally, strength 5/5 in all extremeties.  No ataxia.   LABS:    Chemistry      Component Value Date/Time   NA 142 01/19/2017 0822   K 3.7 01/19/2017 0822   CL 104 01/19/2017 0822   CO2 30 01/19/2017 0822   BUN 9 01/19/2017 0822   CREATININE 0.77 01/19/2017 0822      Component Value Date/Time   CALCIUM 9.2 01/19/2017 0822   ALKPHOS 71 09/27/2016 1008   AST 19 09/27/2016 1008   ALT 15 09/27/2016 1008   BILITOT 0.4 09/27/2016 1008       IMPRESSION AND PLAN:  1) Chronic low back pain:  Stable. I printed rx's for vicodin 5/325, 1 tid prn, #90 today for this month, May 2019, and June 2019.  Appropriate fill on/after date was noted on each rx.  2) Adult ADD: The current medical regimen is effective;  continue present plan and medications. I printed rx's for vyvanse 50mg , 1 cap po qd, #30 today for this month, May 2019, and June 2019.Marland Kitchen  Appropriate fill on/after date was noted on each rx.  3) MDD in remission, GAD doing well. Continue citalopram 40mg  qd and alprazolam 1mg  bid prn.  No rx for alprazolam was done today.  4) Migraine syndrome: pt states she no longer takes imitrex b/c too many side effects. She says that elitriptan 40mg  not only  helps her HAs much better, but it does not give her any side effects. I d/c'd her imitrex today and rx'd eletriptan 40mg --#9, RF x 11.  An After Visit Summary was printed and given to the patient.  FOLLOW UP: Return in about 3 months (around 07/18/2017) for routine chronic illness f/u.  Signed:  Crissie Sickles, MD           04/18/2017

## 2017-05-08 ENCOUNTER — Other Ambulatory Visit: Payer: Self-pay | Admitting: Family Medicine

## 2017-05-10 ENCOUNTER — Ambulatory Visit: Payer: 59 | Admitting: Family Medicine

## 2017-05-10 ENCOUNTER — Encounter: Payer: Self-pay | Admitting: Family Medicine

## 2017-05-10 VITALS — BP 125/85 | HR 69 | Temp 97.5°F | Resp 17 | Ht 64.0 in | Wt 155.0 lb

## 2017-05-10 DIAGNOSIS — J209 Acute bronchitis, unspecified: Secondary | ICD-10-CM | POA: Diagnosis not present

## 2017-05-10 DIAGNOSIS — J069 Acute upper respiratory infection, unspecified: Secondary | ICD-10-CM

## 2017-05-10 MED ORDER — ALBUTEROL SULFATE HFA 108 (90 BASE) MCG/ACT IN AERS: 2.0000 | INHALATION_SPRAY | Freq: Four times a day (QID) | RESPIRATORY_TRACT | 0 refills | Status: DC | PRN

## 2017-05-10 MED ORDER — AZITHROMYCIN 250 MG PO TABS: ORAL_TABLET | ORAL | 0 refills | Status: DC

## 2017-05-10 MED ORDER — PREDNISONE 20 MG PO TABS: ORAL_TABLET | ORAL | 0 refills | Status: DC

## 2017-05-10 NOTE — Progress Notes (Signed)
OFFICE VISIT  05/10/2017   CC:  Chief Complaint  Patient presents with  . Cough    HA x 1 week   HPI:    Patient is a 57 y.o. Caucasian female who presents for respiratory complaints. Onset about 1 wk ago: nasal congestion, runny nose, sneezing, PND ST, coughing a lot. Fever: Tm 100.8 yesterday.  Some rattle in chest, cough a bit productive.  Chest hurts when coughing. No n/v/d. Achy in body onset a couple days ago.  +Bad HAs. Taking otc cold/cough med.   Lost voice. Sx's seem to be getting worse.  Past Medical History:  Diagnosis Date  . Adult ADHD   . Anxiety   . Chronic back pain    neck, too.  MRI L spine 05/2008: 40 degrees dextroscoliosis due to a hemivertebra of L3.  Some DDD/spondylosis w/out spinal stenosis.  . Depression   . Gout    2 attacks total as of 12/2014.  Marland Kitchen Kidney stones 2005; 2017   Most recent CT 08/2016 showed <2 mm nonobstructing nephrolithiasis.  . Migraine    Proph tx with topamax helps, abortive tx with relpax helps.  Couldn't afford topamax so d/c'd this 04/2016.    . Pulmonary nodule 08/2016   6 mm LLL pulm nodule---repeat CT noncontrast recommended 6-12 mo.  Marland Kitchen Restless legs syndrome    Pt can get to sleep fine so she declines med for this as of 08/2016    Past Surgical History:  Procedure Laterality Date  . BREAST SURGERY  1999   reduction  . ENDOMETRIAL ABLATION  2002  . KIDNEY STONE SURGERY  2005  . LAPAROSCOPIC VAGINAL HYSTERECTOMY  12/02/07   for DUB  . REDUCTION MAMMAPLASTY      Outpatient Medications Prior to Visit  Medication Sig Dispense Refill  . ALPRAZolam (XANAX) 1 MG tablet TAKE ONE TABLET BY MOUTH TWICE A DAY AS NEEDED FOR ANXIETY 180 tablet 1  . celecoxib (CELEBREX) 200 MG capsule Take 1 capsule (200 mg total) by mouth 2 (two) times daily as needed. 60 capsule 6  . citalopram (CELEXA) 40 MG tablet TAKE ONE (1) TABLET EACH DAY 90 tablet 1  . colchicine 0.6 MG tablet 2 tabs po at onset of gout pain, then 1 tab po one hour  later 15 tablet 2  . Cyanocobalamin (VITAMIN B-12 PO) Take 1 tablet by mouth daily. Reported on 04/07/2015    . eletriptan (RELPAX) 40 MG tablet 1 tab po qd prn, may repeat in 2 hours if HA not significantly improved (max dose per 24h is 80 mg). 9 tablet 11  . HYDROcodone-acetaminophen (NORCO/VICODIN) 5-325 MG tablet 1 tab tid prn 90 tablet 0  . lisdexamfetamine (VYVANSE) 50 MG capsule Take 1 capsule (50 mg total) by mouth daily. 30 capsule 0  . methocarbamol (ROBAXIN) 500 MG tablet TAKE ONE TO TWO TABLETS BY MOUTH EVERY SIX HOURS AS NEEDED FOR MUSCLERELAXATION 30 tablet 2  . pantoprazole (PROTONIX) 40 MG tablet TAKE ONE (1) TABLET EACH DAY 90 tablet 3  . tretinoin (RETIN-A) 0.025 % cream Apply 1 application topically at bedtime as needed.  99   No facility-administered medications prior to visit.     No Known Allergies  ROS As per HPI  PE: Blood pressure 125/85, pulse 69, temperature (!) 97.5 F (36.4 C), temperature source Oral, resp. rate 17, height 5\' 4"  (1.626 m), weight 155 lb (70.3 kg), SpO2 96 %. VS: noted--normal. Gen: alert, NAD, NONTOXIC APPEARING. HEENT: eyes without injection, drainage, or  swelling.  Ears: EACs clear, TMs with normal light reflex and landmarks.  Nose: Clear rhinorrhea, with some dried, crusty exudate adherent to mildly injected mucosa.  No purulent d/c.  No paranasal sinus TTP.  No facial swelling.  Throat and mouth without focal lesion.  No pharyngial swelling, erythema, or exudate.   Neck: supple, no LAD.   LUNGS:  nonlabored resps.  She has insp/exp rhonchi diffusely, with lots of post-exhalation coughing. CV: RRR, no m/r/g. EXT: no c/c/e SKIN: no rash    LABS:    Chemistry      Component Value Date/Time   NA 142 01/19/2017 0822   K 3.7 01/19/2017 0822   CL 104 01/19/2017 0822   CO2 30 01/19/2017 0822   BUN 9 01/19/2017 0822   CREATININE 0.77 01/19/2017 0822      Component Value Date/Time   CALCIUM 9.2 01/19/2017 0822   ALKPHOS 71 09/27/2016  1008   AST 19 09/27/2016 1008   ALT 15 09/27/2016 1008   BILITOT 0.4 09/27/2016 1008      IMPRESSION AND PLAN:  Acute URI, with acute bronchitis and laryngitis--possibly bacterial. Z-pack. Prednisone 40mg  qd x 5d. Albuterol HFA, 2 p q4h prn.  An After Visit Summary was printed and given to the patient.  FOLLOW UP: Return if symptoms worsen or fail to improve.  Signed:  Crissie Sickles, MD           05/10/2017

## 2017-05-10 NOTE — Patient Instructions (Signed)
Get otc generic robitussin DM OR Mucinex DM and use as directed on the packaging for cough and congestion. Use otc generic saline nasal spray 2-3 times per day to irrigate/moisturize your nasal passages.   

## 2017-05-25 ENCOUNTER — Other Ambulatory Visit: Payer: Self-pay | Admitting: Family Medicine

## 2017-07-26 ENCOUNTER — Encounter: Payer: Self-pay | Admitting: Family Medicine

## 2017-07-26 ENCOUNTER — Ambulatory Visit: Payer: 59 | Admitting: Family Medicine

## 2017-07-30 ENCOUNTER — Other Ambulatory Visit: Payer: Self-pay | Admitting: *Deleted

## 2017-07-30 ENCOUNTER — Ambulatory Visit: Payer: 59 | Admitting: Family Medicine

## 2017-07-30 NOTE — Progress Notes (Deleted)
OFFICE VISIT  07/30/2017   CC: No chief complaint on file.    HPI:    Patient is a 57 y.o. Caucasian female who presents for 3 mo f/u chronic pain syndrome, adult ADD, and anx/dep.  Indication for chronic opioid: She has chronic neck and low back paindue to DDD/spondylosis + scoliosis(and various musculoskeletal pains considered to be osteoarthritis) for which I rx narcotic pain medication to optimize functioning and quality of life Medication and dose: Vicodin 5/325, 1 tid prn # pills per month: #90 Last UDS date: *** Opioid Treatment Agreement signed (Y/N): *** Opioid Treatment Agreement last reviewed with patient: Yes [place] reviewed this encounter (include red flags): yes  ADD:   Anx/dep:    Past Medical History:  Diagnosis Date  . Adult ADHD   . Anxiety   . Chronic back pain    neck, too.  MRI L spine 05/2008: 40 degrees dextroscoliosis due to a hemivertebra of L3.  Some DDD/spondylosis w/out spinal stenosis.  . Depression   . Gout    2 attacks total as of 12/2014.  Marland Kitchen Kidney stones 2005; 2017   Most recent CT 08/2016 showed <2 mm nonobstructing nephrolithiasis.  . Migraine    Proph tx with topamax helps, abortive tx with relpax helps.  Couldn't afford topamax so d/c'd this 04/2016.    . Pulmonary nodule 08/2016   6 mm LLL pulm nodule---repeat CT noncontrast recommended 6-12 mo.  Marland Kitchen Restless legs syndrome    Pt can get to sleep fine so she declines med for this as of 08/2016    Past Surgical History:  Procedure Laterality Date  . BREAST SURGERY  1999   reduction  . ENDOMETRIAL ABLATION  2002  . KIDNEY STONE SURGERY  2005  . LAPAROSCOPIC VAGINAL HYSTERECTOMY  12/02/07   for DUB  . REDUCTION MAMMAPLASTY      Outpatient Medications Prior to Visit  Medication Sig Dispense Refill  . albuterol (VENTOLIN HFA) 108 (90 Base) MCG/ACT inhaler Inhale 2 puffs into the lungs every 6 (six) hours as needed for wheezing or shortness of breath. 1 Inhaler 0  . ALPRAZolam  (XANAX) 1 MG tablet TAKE ONE TABLET BY MOUTH TWICE A DAY AS NEEDED FOR ANXIETY 180 tablet 1  . azithromycin (ZITHROMAX) 250 MG tablet 2 tabs po qd x 1d, then 1 tab po qd x 4d 6 tablet 0  . celecoxib (CELEBREX) 200 MG capsule Take 1 capsule (200 mg total) by mouth 2 (two) times daily as needed. 60 capsule 6  . citalopram (CELEXA) 40 MG tablet TAKE ONE (1) TABLET BY MOUTH EVERY DAY 90 tablet 1  . colchicine 0.6 MG tablet 2 tabs po at onset of gout pain, then 1 tab po one hour later 15 tablet 2  . Cyanocobalamin (VITAMIN B-12 PO) Take 1 tablet by mouth daily. Reported on 04/07/2015    . eletriptan (RELPAX) 40 MG tablet 1 tab po qd prn, may repeat in 2 hours if HA not significantly improved (max dose per 24h is 80 mg). 9 tablet 11  . HYDROcodone-acetaminophen (NORCO/VICODIN) 5-325 MG tablet 1 tab tid prn 90 tablet 0  . lisdexamfetamine (VYVANSE) 50 MG capsule Take 1 capsule (50 mg total) by mouth daily. 30 capsule 0  . methocarbamol (ROBAXIN) 500 MG tablet TAKE ONE TO TWO TABLETS BY MOUTH EVERY SIX HOURS AS NEEDED FOR MUSCLERELAXATION 30 tablet 2  . pantoprazole (PROTONIX) 40 MG tablet TAKE ONE (1) TABLET EACH DAY 90 tablet 3  . predniSONE (DELTASONE) 20  MG tablet 2 tabs po qd x 5d 10 tablet 0  . tretinoin (RETIN-A) 0.025 % cream Apply 1 application topically at bedtime as needed.  99   No facility-administered medications prior to visit.     No Known Allergies  ROS As per HPI  PE: There were no vitals taken for this visit. ***  LABS:    Chemistry      Component Value Date/Time   NA 142 01/19/2017 0822   K 3.7 01/19/2017 0822   CL 104 01/19/2017 0822   CO2 30 01/19/2017 0822   BUN 9 01/19/2017 0822   CREATININE 0.77 01/19/2017 0822      Component Value Date/Time   CALCIUM 9.2 01/19/2017 0822   ALKPHOS 71 09/27/2016 1008   AST 19 09/27/2016 1008   ALT 15 09/27/2016 1008   BILITOT 0.4 09/27/2016 1008      IMPRESSION AND PLAN:  No problem-specific Assessment & Plan notes found  for this encounter.  An After Visit Summary was printed and given to the patient.  FOLLOW UP: No follow-ups on file.  Signed:  Crissie Sickles, MD           07/30/2017

## 2017-08-02 ENCOUNTER — Encounter: Payer: Self-pay | Admitting: Family Medicine

## 2017-08-02 MED ORDER — LISDEXAMFETAMINE DIMESYLATE 50 MG PO CAPS: 50.0000 mg | ORAL_CAPSULE | Freq: Every day | ORAL | 0 refills | Status: DC

## 2017-08-02 NOTE — Telephone Encounter (Signed)
Please advise. Thanks.  

## 2017-08-02 NOTE — Telephone Encounter (Signed)
Vyvanse eRxd.

## 2017-08-17 ENCOUNTER — Encounter: Payer: 59 | Admitting: Family Medicine

## 2017-08-17 DIAGNOSIS — Z0289 Encounter for other administrative examinations: Secondary | ICD-10-CM

## 2017-08-17 NOTE — Progress Notes (Deleted)
Office Note 08/17/2017  CC: No chief complaint on file.   HPI:  Jacqueline Vance is a 57 y.o. White female who is her for health maintenance exam/CPE.  Additionally, she has chronic pain syndrome f/u today.  Indication for chronic opioid: She has chronic neck and low back paindue to DDD/spondylosis + scoliosis(and various musculoskeletal pains considered to be osteoarthritis) for which I rx narcotic pain medication to optimize functioning and quality of life  Medication and dose: Vicodin 5/325, 1 tid prn # pills per month: #90 Last UDS date: 05/25/2016 Opioid Treatment Agreement signed (Y/N):  Y, 09/22/16 Opioid Treatment Agreement last reviewed with patient: today [place] reviewed this encounter (include red flags): today, no red flags.  Taking alprazolam bid in addition to citalopram 40mg  qd for chronic anxiety, hx of depression (in remission).  Past Medical History:  Diagnosis Date  . Adult ADHD   . Anxiety   . Chronic back pain    neck, too.  MRI L spine 05/2008: 40 degrees dextroscoliosis due to a hemivertebra of L3.  Some DDD/spondylosis w/out spinal stenosis.  . Depression   . Gout    2 attacks total as of 12/2014.  Marland Kitchen Kidney stones 2005; 2017   Most recent CT 08/2016 showed <2 mm nonobstructing nephrolithiasis.  . Migraine    Proph tx with topamax helps, abortive tx with relpax helps.  Couldn't afford topamax so d/c'd this 04/2016.    . Pulmonary nodule 08/2016   6 mm LLL pulm nodule---repeat CT noncontrast recommended 6-12 mo.  Marland Kitchen Restless legs syndrome    Pt can get to sleep fine so she declines med for this as of 08/2016    Past Surgical History:  Procedure Laterality Date  . BREAST SURGERY  1999   reduction  . ENDOMETRIAL ABLATION  2002  . KIDNEY STONE SURGERY  2005  . LAPAROSCOPIC VAGINAL HYSTERECTOMY  12/02/07   for DUB  . REDUCTION MAMMAPLASTY      Family History  Problem Relation Age of Onset  . Cancer Mother        brain ca (glioblastoma)  . Breast  cancer Paternal Aunt   . Breast cancer Maternal Grandmother     Social History   Socioeconomic History  . Marital status: Married    Spouse name: Not on file  . Number of children: Not on file  . Years of education: Not on file  . Highest education level: Not on file  Occupational History  . Not on file  Social Needs  . Financial resource strain: Not on file  . Food insecurity:    Worry: Not on file    Inability: Not on file  . Transportation needs:    Medical: Not on file    Non-medical: Not on file  Tobacco Use  . Smoking status: Never Smoker  . Smokeless tobacco: Never Used  Substance and Sexual Activity  . Alcohol use: No  . Drug use: No  . Sexual activity: Not on file  Lifestyle  . Physical activity:    Days per week: Not on file    Minutes per session: Not on file  . Stress: Not on file  Relationships  . Social connections:    Talks on phone: Not on file    Gets together: Not on file    Attends religious service: Not on file    Active member of club or organization: Not on file    Attends meetings of clubs or organizations: Not on file  Relationship status: Not on file  . Intimate partner violence:    Fear of current or ex partner: Not on file    Emotionally abused: Not on file    Physically abused: Not on file    Forced sexual activity: Not on file  Other Topics Concern  . Not on file  Social History Narrative   Married, one adult son.  Lives in [place], [place].   Works for [employer] in [place].    Exercise: walks 3 miles per day.   No T/A/Ds.                Outpatient Medications Prior to Visit  Medication Sig Dispense Refill  . albuterol (VENTOLIN HFA) 108 (90 Base) MCG/ACT inhaler Inhale 2 puffs into the lungs every 6 (six) hours as needed for wheezing or shortness of breath. 1 Inhaler 0  . ALPRAZolam (XANAX) 1 MG tablet TAKE ONE TABLET BY MOUTH TWICE A DAY AS NEEDED FOR ANXIETY 180 tablet 1  . azithromycin (ZITHROMAX)  250 MG tablet 2 tabs po qd x 1d, then 1 tab po qd x 4d 6 tablet 0  . celecoxib (CELEBREX) 200 MG capsule Take 1 capsule (200 mg total) by mouth 2 (two) times daily as needed. 60 capsule 6  . citalopram (CELEXA) 40 MG tablet TAKE ONE (1) TABLET BY MOUTH EVERY DAY 90 tablet 1  . colchicine 0.6 MG tablet 2 tabs po at onset of gout pain, then 1 tab po one hour later 15 tablet 2  . Cyanocobalamin (VITAMIN B-12 PO) Take 1 tablet by mouth daily. Reported on 04/07/2015    . eletriptan (RELPAX) 40 MG tablet 1 tab po qd prn, may repeat in 2 hours if HA not significantly improved (max dose per 24h is 80 mg). 9 tablet 11  . HYDROcodone-acetaminophen (NORCO/VICODIN) 5-325 MG tablet 1 tab tid prn 90 tablet 0  . lisdexamfetamine (VYVANSE) 50 MG capsule Take 1 capsule (50 mg total) by mouth daily. 30 capsule 0  . methocarbamol (ROBAXIN) 500 MG tablet TAKE ONE TO TWO TABLETS BY MOUTH EVERY SIX HOURS AS NEEDED FOR MUSCLERELAXATION 30 tablet 2  . pantoprazole (PROTONIX) 40 MG tablet TAKE ONE (1) TABLET EACH DAY 90 tablet 3  . predniSONE (DELTASONE) 20 MG tablet 2 tabs po qd x 5d 10 tablet 0  . tretinoin (RETIN-A) 0.025 % cream Apply 1 application topically at bedtime as needed.  99   No facility-administered medications prior to visit.     No Known Allergies  ROS *** PE; There were no vitals taken for this visit. *** Pertinent labs:  Lab Results  Component Value Date   TSH 0.93 09/27/2016   Lab Results  Component Value Date   WBC 4.6 09/27/2016   HGB 13.7 09/27/2016   HCT 42.7 09/27/2016   MCV 90.2 09/27/2016   PLT 224.0 09/27/2016   Lab Results  Component Value Date   CREATININE 0.77 01/19/2017   BUN 9 01/19/2017   NA 142 01/19/2017   K 3.7 01/19/2017   CL 104 01/19/2017   CO2 30 01/19/2017   Lab Results  Component Value Date   ALT 15 09/27/2016   AST 19 09/27/2016   ALKPHOS 71 09/27/2016   BILITOT 0.4 09/27/2016   Lab Results  Component Value Date   CHOL 162 09/27/2016   Lab  Results  Component Value Date   HDL 48.30 09/27/2016   Lab Results  Component Value Date   LDLCALC 94 09/27/2016   Lab  Results  Component Value Date   TRIG 101.0 09/27/2016   Lab Results  Component Value Date   CHOLHDL 3 09/27/2016     ASSESSMENT AND PLAN:   1) Chronic pain syndrome  2) GAD with depression: The current medical regimen is effective;  continue present plan and medications. Xanax 1mg , 1 bid prn, #180, RF x1.    3) Pulm nodule: Repeat CT chest for 1 yr f/u pulm nodule is ordered.  4) Health maintenance exam: Reviewed age and gender appropriate health maintenance issues (prudent diet, regular exercise, health risks of tobacco and excessive alcohol, use of seatbelts, fire alarms in home, use of sunscreen).  Also reviewed age and gender appropriate health screening as well as vaccine recommendations. Vaccines: UTD.  Shingrix discussed-- Labs: fasting HP Cervical ca screening: n/a--pt has had hysterectomy for benign reasons (DUB). Breast ca screening: last mammo 10/06/16--> Colon ca screening: referred pt to [facility] for colonoscopy 09/2016 at last CPE but she never scheduled appt: ***   An After Visit Summary was printed and given to the patient.  FOLLOW UP:  No follow-ups on file.  Signed:  Crissie Sickles, MD           08/17/2017

## 2017-08-30 ENCOUNTER — Telehealth: Payer: Self-pay | Admitting: Family Medicine

## 2017-08-30 ENCOUNTER — Encounter: Payer: Self-pay | Admitting: Family Medicine

## 2017-08-30 ENCOUNTER — Other Ambulatory Visit: Payer: Self-pay | Admitting: Family Medicine

## 2017-08-30 ENCOUNTER — Encounter: Payer: 59 | Admitting: Family Medicine

## 2017-08-30 ENCOUNTER — Ambulatory Visit: Payer: 59 | Admitting: Family Medicine

## 2017-08-30 MED ORDER — HYDROCODONE-ACETAMINOPHEN 5-325 MG PO TABS: ORAL_TABLET | ORAL | 0 refills | Status: DC

## 2017-08-30 MED ORDER — LISDEXAMFETAMINE DIMESYLATE 50 MG PO CAPS: 50.0000 mg | ORAL_CAPSULE | Freq: Every day | ORAL | 0 refills | Status: DC

## 2017-08-30 NOTE — Telephone Encounter (Signed)
vyvanse refill Last Refill:08/02/17 # 30 Last OV: 04/18/17 PCP: Dr Anitra Lauth Pharmacy: [facility]  Hydrocodone-acetaminophen 5-325 refill Last Refill:04/18/17 # 90 Last OV: 04/18/17 PCP: Dr Anitra Lauth Pharmacy: [facility]

## 2017-08-30 NOTE — Telephone Encounter (Signed)
I'll eRx #15 vicodin and 1 mo supply of vyvanse, but she is overdue for o/v regarding the f/u of chronic pain. I have to see her in the office every 3 months in order to continue rx'ing pain meds.

## 2017-08-30 NOTE — Telephone Encounter (Signed)
My Chart message sent

## 2017-08-30 NOTE — Telephone Encounter (Signed)
Copied from [place] [id number]. Topic: Quick Communication - Rx Refill/Question >> Aug 30, 2017  8:42 AM Keene Breath wrote: Medication: HYDROcodone-acetaminophen (NORCO/VICODIN) 5-325 MG tablet / lisdexamfetamine (VYVANSE) 50 MG capsule  Patient called to request a refill for the above medication.  CB# [phone]  Preferred Pharmacy (with phone number or street name): [place], [place] [phone] (Phone) [phone] (Fax)

## 2017-08-30 NOTE — Telephone Encounter (Signed)
NOV: 09/13/17  Please advise. Thanks.

## 2017-08-30 NOTE — Telephone Encounter (Signed)
MyChart message read.

## 2017-09-03 ENCOUNTER — Other Ambulatory Visit: Payer: Self-pay | Admitting: Family Medicine

## 2017-09-03 NOTE — Telephone Encounter (Signed)
RF request for celecoxib LOV: 04/18/17 Next ov:  09/13/17 Last written: 09/22/16 #60 w/ 6RF  Please advise. Thanks.

## 2017-09-04 ENCOUNTER — Ambulatory Visit: Payer: 59 | Admitting: Family Medicine

## 2017-09-13 ENCOUNTER — Encounter: Payer: Self-pay | Admitting: *Deleted

## 2017-09-13 ENCOUNTER — Ambulatory Visit (INDEPENDENT_AMBULATORY_CARE_PROVIDER_SITE_OTHER): Payer: 59 | Admitting: Family Medicine

## 2017-09-13 ENCOUNTER — Encounter: Payer: Self-pay | Admitting: Family Medicine

## 2017-09-13 VITALS — BP 109/75 | HR 66 | Temp 97.4°F | Resp 16 | Ht 63.5 in | Wt 164.4 lb

## 2017-09-13 DIAGNOSIS — R911 Solitary pulmonary nodule: Secondary | ICD-10-CM

## 2017-09-13 DIAGNOSIS — E663 Overweight: Secondary | ICD-10-CM

## 2017-09-13 DIAGNOSIS — Z1211 Encounter for screening for malignant neoplasm of colon: Secondary | ICD-10-CM

## 2017-09-13 DIAGNOSIS — B882 Other arthropod infestations: Secondary | ICD-10-CM

## 2017-09-13 DIAGNOSIS — G894 Chronic pain syndrome: Secondary | ICD-10-CM | POA: Diagnosis not present

## 2017-09-13 DIAGNOSIS — Z23 Encounter for immunization: Secondary | ICD-10-CM

## 2017-09-13 DIAGNOSIS — F331 Major depressive disorder, recurrent, moderate: Secondary | ICD-10-CM

## 2017-09-13 DIAGNOSIS — F988 Other specified behavioral and emotional disorders with onset usually occurring in childhood and adolescence: Secondary | ICD-10-CM

## 2017-09-13 DIAGNOSIS — Z0000 Encounter for general adult medical examination without abnormal findings: Secondary | ICD-10-CM | POA: Diagnosis not present

## 2017-09-13 LAB — CBC WITH DIFFERENTIAL/PLATELET
Basophils Absolute: 0.1 10*3/uL (ref 0.0–0.1)
Basophils Relative: 1.2 % (ref 0.0–3.0)
Eosinophils Absolute: 0.1 10*3/uL (ref 0.0–0.7)
Eosinophils Relative: 2.8 % (ref 0.0–5.0)
HCT: 42.8 % (ref 36.0–46.0)
Hemoglobin: 14.1 g/dL (ref 12.0–15.0)
Lymphocytes Relative: 53.1 % — ABNORMAL HIGH (ref 12.0–46.0)
Lymphs Abs: 2.7 10*3/uL (ref 0.7–4.0)
MCHC: 33 g/dL (ref 30.0–36.0)
MCV: 87 fl (ref 78.0–100.0)
Monocytes Absolute: 0.5 10*3/uL (ref 0.1–1.0)
Monocytes Relative: 10.1 % (ref 3.0–12.0)
Neutro Abs: 1.7 10*3/uL (ref 1.4–7.7)
Neutrophils Relative %: 32.8 % — ABNORMAL LOW (ref 43.0–77.0)
Platelets: 232 10*3/uL (ref 150.0–400.0)
RBC: 4.93 Mil/uL (ref 3.87–5.11)
RDW: 13.5 % (ref 11.5–15.5)
WBC: 5.1 10*3/uL (ref 4.0–10.5)

## 2017-09-13 LAB — LIPID PANEL
Cholesterol: 196 mg/dL (ref 0–200)
HDL: 52.6 mg/dL (ref >39.00)
LDL Cholesterol: 118 mg/dL — ABNORMAL HIGH (ref 0–99)
NonHDL: 142.95
Total CHOL/HDL Ratio: 4
Triglycerides: 123 mg/dL (ref 0.0–149.0)
VLDL: 24.6 mg/dL (ref 0.0–40.0)

## 2017-09-13 LAB — COMPREHENSIVE METABOLIC PANEL
ALT: 20 U/L (ref 0–35)
AST: 21 U/L (ref 0–37)
Albumin: 4.2 g/dL (ref 3.5–5.2)
Alkaline Phosphatase: 100 U/L (ref 39–117)
BUN: 8 mg/dL (ref 6–23)
CO2: 31 mEq/L (ref 19–32)
Calcium: 9.6 mg/dL (ref 8.4–10.5)
Chloride: 105 mEq/L (ref 96–112)
Creatinine, Ser: 0.84 mg/dL (ref 0.40–1.20)
GFR: 74.76 mL/min (ref >60.00)
Glucose, Bld: 108 mg/dL — ABNORMAL HIGH (ref 70–99)
Potassium: 4.2 mEq/L (ref 3.5–5.1)
Sodium: 141 mEq/L (ref 135–145)
Total Bilirubin: 0.5 mg/dL (ref 0.2–1.2)
Total Protein: 6.8 g/dL (ref 6.0–8.3)

## 2017-09-13 LAB — TSH: TSH: 0.96 u[IU]/mL (ref 0.35–4.50)

## 2017-09-13 MED ORDER — LISDEXAMFETAMINE DIMESYLATE 50 MG PO CAPS: 50.0000 mg | ORAL_CAPSULE | Freq: Every day | ORAL | 0 refills | Status: DC

## 2017-09-13 MED ORDER — HYDROCODONE-ACETAMINOPHEN 5-325 MG PO TABS: ORAL_TABLET | ORAL | 0 refills | Status: DC

## 2017-09-13 MED ORDER — DOXYCYCLINE HYCLATE 100 MG PO CAPS: 100.0000 mg | ORAL_CAPSULE | Freq: Two times a day (BID) | ORAL | 0 refills | Status: AC

## 2017-09-13 NOTE — Progress Notes (Signed)
Office Note 09/13/2017  CC:  Chief Complaint  Patient presents with  . Annual Exam    Pt is fasting.    HPI:  Jacqueline Vance is a 57 y.o. White female who is here for annual health maintenance exam and f/u chronic pain syndrome and adult ADD.  Got a tick bite 6 d/a and in the last few days she has felt "flu-like" sx's-->all joints ache and are stiff, fatigued, mild subjective fevers.  No rash.  No HA.  +Posterior neck stiffness-worse than her normal.  Very mild cough and ST. She said it also could be that she just feels bad/worse than normal b/c of her mood being much poorer lately-->she lost her job, feels worthless/not good enough for anyone, frustrated with her weight as usual, tired of hurting all the time. She broke down today while talking with me, cried a while, got some things off her chest from a frustration standpoint. She assured me she did not feel like hurting herself or others.   Indication for chronic opioid: She has chronic neck and low back paindue to DDD/spondylosis + scoliosis(and various musculoskeletal pains considered to be osteoarthritis) for which I rx narcotic pain medication to optimize functioning and quality of life Medication and dose: Vicodin 5/325, 1 tid prn. # pills per month: 90 Last UDS date: 05/25/16 Opioid Treatment Agreement signed (Y/N): Y, 09/22/16. Opioid Treatment Agreement last reviewed with patient:  today. Jenkins reviewed this encounter (include red flags):  today, no red flags.  Adult ADD: Has been stable on vyvanse 50mg  qd. Pt states all is going well with the med at current dosing: much improved focus, concentration, task completion.  Less frustration, better multitasking, less impulsivity and restlessness.  Mood is stable. No side effects from the medication.    Past Medical History:  Diagnosis Date  . Adult ADHD   . Anxiety   . Chronic back pain    neck, too.  MRI L spine 05/2008: 40 degrees dextroscoliosis due to a hemivertebra  of L3.  Some DDD/spondylosis w/out spinal stenosis.  . Depression   . Gout    2 attacks total as of 12/2014.  Marland Kitchen Kidney stones 2005; 2017   Most recent CT 08/2016 showed <2 mm nonobstructing nephrolithiasis.  . Migraine    Proph tx with topamax helps, abortive tx with relpax helps.  Couldn't afford topamax so d/c'd this 04/2016.    . Pulmonary nodule 08/2016   6 mm LLL pulm nodule---repeat CT noncontrast recommended 6-12 mo.  Marland Kitchen Restless legs syndrome    Pt can get to sleep fine so she declines med for this as of 08/2016    Past Surgical History:  Procedure Laterality Date  . BREAST SURGERY  1999   reduction  . ENDOMETRIAL ABLATION  2002  . KIDNEY STONE SURGERY  2005  . LAPAROSCOPIC VAGINAL HYSTERECTOMY  12/02/07   for DUB  . REDUCTION MAMMAPLASTY      Family History  Problem Relation Age of Onset  . Cancer Mother        brain ca (glioblastoma)  . Breast cancer Paternal Aunt   . Breast cancer Maternal Grandmother     Social History   Socioeconomic History  . Marital status: Married    Spouse name: Not on file  . Number of children: Not on file  . Years of education: Not on file  . Highest education level: Not on file  Occupational History  . Not on file  Social Needs  . Emergency planning/management officer  strain: Not on file  . Food insecurity:    Worry: Not on file    Inability: Not on file  . Transportation needs:    Medical: Not on file    Non-medical: Not on file  Tobacco Use  . Smoking status: Never Smoker  . Smokeless tobacco: Never Used  Substance and Sexual Activity  . Alcohol use: No  . Drug use: No  . Sexual activity: Not on file  Lifestyle  . Physical activity:    Days per week: Not on file    Minutes per session: Not on file  . Stress: Not on file  Relationships  . Social connections:    Talks on phone: Not on file    Gets together: Not on file    Attends religious service: Not on file    Active member of club or organization: Not on file    Attends meetings  of clubs or organizations: Not on file    Relationship status: Not on file  . Intimate partner violence:    Fear of current or ex partner: Not on file    Emotionally abused: Not on file    Physically abused: Not on file    Forced sexual activity: Not on file  Other Topics Concern  . Not on file  Social History Narrative   Married, one adult son.  Lives in [place], [place].   Works for [employer] in [place].    Exercise: walks 3 miles per day.   No T/A/Ds.                Outpatient Medications Prior to Visit  Medication Sig Dispense Refill  . albuterol (VENTOLIN HFA) 108 (90 Base) MCG/ACT inhaler Inhale 2 puffs into the lungs every 6 (six) hours as needed for wheezing or shortness of breath. 1 Inhaler 0  . ALPRAZolam (XANAX) 1 MG tablet TAKE ONE TABLET BY MOUTH TWICE A DAY AS NEEDED FOR ANXIETY 180 tablet 1  . Biotin 10000 MCG TABS Take 1 tablet by mouth daily.    . celecoxib (CELEBREX) 200 MG capsule TAKE 1 CAPSULE BY MOUTH TWICE A DAY AS NEEDED 60 capsule 6  . Cholecalciferol (VITAMIN D3) 5000 units CAPS Take 1 capsule by mouth daily.    . citalopram (CELEXA) 40 MG tablet TAKE ONE (1) TABLET BY MOUTH EVERY DAY 90 tablet 1  . colchicine 0.6 MG tablet 2 tabs po at onset of gout pain, then 1 tab po one hour later 15 tablet 2  . Cyanocobalamin (VITAMIN B-12 PO) Take 1 tablet by mouth daily. Reported on 04/07/2015    . eletriptan (RELPAX) 40 MG tablet 1 tab po qd prn, may repeat in 2 hours if HA not significantly improved (max dose per 24h is 80 mg). 9 tablet 11  . HYDROcodone-acetaminophen (NORCO/VICODIN) 5-325 MG tablet 1 tab tid prn 15 tablet 0  . lisdexamfetamine (VYVANSE) 50 MG capsule Take 1 capsule (50 mg total) by mouth daily. 30 capsule 0  . methocarbamol (ROBAXIN) 500 MG tablet TAKE ONE TO TWO TABLETS BY MOUTH EVERY SIX HOURS AS NEEDED FOR MUSCLERELAXATION 30 tablet 2  . pantoprazole (PROTONIX) 40 MG tablet TAKE ONE (1) TABLET EACH DAY 90 tablet 3  .  tretinoin (RETIN-A) 0.025 % cream Apply 1 application topically at bedtime as needed.  99  . azithromycin (ZITHROMAX) 250 MG tablet 2 tabs po qd x 1d, then 1 tab po qd x 4d (Patient not taking: Reported on 09/13/2017) 6 tablet 0  .  predniSONE (DELTASONE) 20 MG tablet 2 tabs po qd x 5d (Patient not taking: Reported on 09/13/2017) 10 tablet 0   No facility-administered medications prior to visit.     No Known Allergies  ROS Review of Systems  Constitutional: Positive for fatigue. Negative for appetite change, chills and fever.  HENT: Negative for congestion, dental problem, ear pain, rhinorrhea, sinus pain and sore throat.   Eyes: Negative for discharge, redness and visual disturbance.  Respiratory: Negative for cough, chest tightness, shortness of breath and wheezing.   Cardiovascular: Negative for chest pain, palpitations and leg swelling.  Gastrointestinal: Negative for abdominal pain, blood in stool, diarrhea, nausea and vomiting.  Genitourinary: Negative for difficulty urinating, dysuria, flank pain, frequency, hematuria and urgency.  Musculoskeletal: Positive for arthralgias, back pain, myalgias and neck stiffness. Negative for joint swelling.  Skin: Negative for pallor and rash.  Neurological: Negative for dizziness, speech difficulty, weakness and headaches.  Hematological: Negative for adenopathy. Does not bruise/bleed easily.  Psychiatric/Behavioral: Positive for dysphoric mood. Negative for confusion, hallucinations, sleep disturbance and suicidal ideas. The patient is nervous/anxious.     PE; Blood pressure 109/75, pulse 66, temperature (!) 97.4 F (36.3 C), temperature source Oral, resp. rate 16, height 5' 3.5" (1.613 m), weight 164 lb 6 oz (74.6 kg), SpO2 96 %. Body mass index is 28.66 kg/m. Pt examined with Helayne Seminole, CMA, as chaperone.  Gen: Alert, well appearing.  Patient is oriented to person, place, time, and situation. AFFECT: pleasant, lucid thought and  speech. ENT: Ears: EACs clear, normal epithelium.  TMs with good light reflex and landmarks bilaterally.  Eyes: no injection, icteris, swelling, or exudate.  EOMI, PERRLA. Nose: no drainage or turbinate edema/swelling.  No injection or focal lesion.  Mouth: lips without lesion/swelling.  Oral mucosa pink and moist.  Dentition intact and without obvious caries or gingival swelling.  Oropharynx without erythema, exudate, or swelling.  Neck: supple/nontender.  No rigidity, no tenderness.  No LAD, mass, or TM.  Carotid pulses 2+ bilaterally, without bruits. CV: RRR, no m/r/g.   LUNGS: CTA bilat, nonlabored resps, good aeration in all lung fields. ABD: soft, NT, ND, BS normal.  No hepatospenomegaly or mass.  No bruits. EXT: no clubbing, cyanosis, or edema.  Musculoskeletal: no joint swelling, erythema, warmth, or tenderness.  ROM of all joints intact. Skin - no sores or suspicious lesions or rashes or color changes    Pertinent labs:  Lab Results  Component Value Date   TSH 0.93 09/27/2016   Lab Results  Component Value Date   WBC 4.6 09/27/2016   HGB 13.7 09/27/2016   HCT 42.7 09/27/2016   MCV 90.2 09/27/2016   PLT 224.0 09/27/2016   Lab Results  Component Value Date   CREATININE 0.77 01/19/2017   BUN 9 01/19/2017   NA 142 01/19/2017   K 3.7 01/19/2017   CL 104 01/19/2017   CO2 30 01/19/2017   Lab Results  Component Value Date   ALT 15 09/27/2016   AST 19 09/27/2016   ALKPHOS 71 09/27/2016   BILITOT 0.4 09/27/2016   Lab Results  Component Value Date   CHOL 162 09/27/2016   Lab Results  Component Value Date   HDL 48.30 09/27/2016   Lab Results  Component Value Date   LDLCALC 94 09/27/2016   Lab Results  Component Value Date   TRIG 101.0 09/27/2016   Lab Results  Component Value Date   CHOLHDL 3 09/27/2016     ASSESSMENT AND PLAN:  1) Tick bite-->general tick-borne illness sx's after-->empiric doxy x 10d.  2) Chronic pain syndrome: renewed pain contract.   I sent erx's for vicodin 5/325, 1-2 qid prn today for this month, Sept 2019, and Oct 2019.  Appropriate fill on/after date was noted on each rx.  3) Adult ADD: The current medical regimen is effective;  continue present plan and medications. I did erx's for vyvanse 50mg  qd, #30 today for Sept 2019, Oct 2019, and Nov 2019.  Appropriate fill on/after date was noted on each rx.  4) Pulm nodule: needs repeat CT as per radiology rec's-->this has been ordered already for [facility] and she can get this scheduled any time now--> she will call and schedule.  5) MDD, recurrent: she is not doing very well ever since losing her job recently. Denies suicidal or homicidal ideation.  She knows that changes in meds at this time is not likely the answer. She wants to continue current citalopram and alpraz dosing.  Discussed appropriate precautions regarding ANY thoughts of suicide or homicide --call 911 or go to nearest ED.    6) Health maintenance exam: Reviewed age and gender appropriate health maintenance issues (prudent diet, regular exercise, health risks of tobacco and excessive alcohol, use of seatbelts, fire alarms in home, use of sunscreen).  Also reviewed age and gender appropriate health screening as well as vaccine recommendations. Vaccines: UTD.  Flu vaccine--> given today. Labs: fasting HP. Cervical ca screening: pt is s/p hysterectomy for benign dx.  GYN MD is Dr. Radene Knee and she will f/u with him routinely. Breast ca screening: next mammogram due 09/2017-->Dr. McComb.  Pt will call and schedule. Colon ca screening: has not had screening colonoscopy despite being referred to GI for this 1 yr ago.  She chooses cologuard at this time.  An After Visit Summary was printed and given to the patient.  FOLLOW UP:  No follow-ups on file.  Signed:  Crissie Sickles, MD           09/13/2017

## 2017-09-13 NOTE — Patient Instructions (Signed)

## 2017-09-14 ENCOUNTER — Other Ambulatory Visit: Payer: Self-pay | Admitting: Family Medicine

## 2017-09-16 LAB — PAIN MGMT, PROFILE 8 W/CONF, U
6 Acetylmorphine: NEGATIVE ng/mL (ref <10)
Alcohol Metabolites: NEGATIVE ng/mL (ref <500)
Alphahydroxyalprazolam: 80 ng/mL — ABNORMAL HIGH (ref <25)
Alphahydroxymidazolam: NEGATIVE ng/mL (ref <50)
Alphahydroxytriazolam: NEGATIVE ng/mL (ref <50)
Aminoclonazepam: NEGATIVE ng/mL (ref <25)
Amphetamine: 2328 ng/mL — ABNORMAL HIGH (ref <250)
Amphetamines: POSITIVE ng/mL — AB (ref <500)
Benzodiazepines: POSITIVE ng/mL — AB (ref <100)
Buprenorphine, Urine: NEGATIVE ng/mL (ref <5)
Cocaine Metabolite: NEGATIVE ng/mL (ref <150)
Codeine: NEGATIVE ng/mL (ref <50)
Creatinine: 165.1 mg/dL
Hydrocodone: NEGATIVE ng/mL (ref <50)
Hydromorphone: NEGATIVE ng/mL (ref <50)
Hydroxyethylflurazepam: NEGATIVE ng/mL (ref <50)
Lorazepam: NEGATIVE ng/mL (ref <50)
MDMA: NEGATIVE ng/mL (ref <500)
Marijuana Metabolite: NEGATIVE ng/mL (ref <20)
Methamphetamine: NEGATIVE ng/mL (ref <250)
Morphine: NEGATIVE ng/mL (ref <50)
Nordiazepam: NEGATIVE ng/mL (ref <50)
Norhydrocodone: 72 ng/mL — ABNORMAL HIGH (ref <50)
Opiates: POSITIVE ng/mL — AB (ref <100)
Oxazepam: NEGATIVE ng/mL (ref <50)
Oxidant: NEGATIVE ug/mL (ref <200)
Oxycodone: NEGATIVE ng/mL (ref <100)
Temazepam: NEGATIVE ng/mL (ref <50)
pH: 6.9 (ref 4.5–9.0)

## 2017-09-26 ENCOUNTER — Encounter: Payer: Self-pay | Admitting: Family Medicine

## 2017-09-26 ENCOUNTER — Ambulatory Visit: Payer: 59 | Admitting: Family Medicine

## 2017-09-26 VITALS — BP 126/80 | HR 51 | Temp 97.7°F | Resp 16 | Ht 63.5 in | Wt 167.2 lb

## 2017-09-26 DIAGNOSIS — B882 Other arthropod infestations: Secondary | ICD-10-CM | POA: Diagnosis not present

## 2017-09-26 DIAGNOSIS — F4323 Adjustment disorder with mixed anxiety and depressed mood: Secondary | ICD-10-CM | POA: Diagnosis not present

## 2017-09-26 DIAGNOSIS — G894 Chronic pain syndrome: Secondary | ICD-10-CM

## 2017-09-26 NOTE — Progress Notes (Signed)
OFFICE VISIT  10/07/2017   CC:  Chief Complaint  Patient presents with  . Follow-up    mood and tick bites    HPI:    Patient is a 57 y.o. Caucasian female who presents for "tick bite". I saw her on 09/13/17 for her CPE/follow up chronic problems, and she reported having a tick bite about a week prior. A few days after the bite she felt achiness, worsened fatigue, subjective fevers-->I rx'd doxy x 10d.  She had not had any rash associated with the bite. She had also been undergoing lots of mental/emotional stress during that period of time due to loss of job. All labs were normal at that visit (see below).  Overall much improved but still with waxing/waning musculoskeletal pain that is more c/w her baseline. Reiterates her mental, emotional, and physical struggles (chronic pain) and says he is considering applying for disability. She says she doesn't think she can work with the pain she has constantly: neck, mid back, low back, HA's, myofascial pain, myalgias of appendicular skeleton.  Some days finds it hard to get out of bed and function.   She carries dx of DDD of C and L spine, significant scoliosis, myofascial pain syndrome with trigger points and recurrent trochanteric bursitis.  Has seen a handful of specialists in the past (spine and scoliosis center in [place], [facility] [place] [facility], neurologist, in [place], also [facility] center in [place]).  She has never been managed by a pain mgmt specialist.     Past Medical History:  Diagnosis Date  . Adult ADHD   . Anxiety   . Chronic back pain    neck, too.  MRI L spine 05/2008: 40 degrees dextroscoliosis due to a hemivertebra of L3.  Some DDD/spondylosis w/out spinal stenosis.  . Depression   . Gout    2 attacks total as of 12/2014.  Marland Kitchen Kidney stones 2005; 2017   Most recent CT 08/2016 showed <2 mm nonobstructing nephrolithiasis.  . Migraine    Proph tx with topamax helps, abortive tx with relpax helps.  Couldn't afford topamax so d/c'd this 04/2016.     . Pulmonary nodule 08/2016   6 mm LLL pulm nodule---repeat CT noncontrast recommended 6-12 mo.  Marland Kitchen Restless legs syndrome    Pt can get to sleep fine so she declines med for this as of 08/2016    Past Surgical History:  Procedure Laterality Date  . BREAST SURGERY  1999   reduction  . ENDOMETRIAL ABLATION  2002  . KIDNEY STONE SURGERY  2005  . LAPAROSCOPIC VAGINAL HYSTERECTOMY  12/02/07   for DUB  . REDUCTION MAMMAPLASTY      Outpatient Medications Prior to Visit  Medication Sig Dispense Refill  . albuterol (VENTOLIN HFA) 108 (90 Base) MCG/ACT inhaler Inhale 2 puffs into the lungs every 6 (six) hours as needed for wheezing or shortness of breath. 1 Inhaler 0  . ALPRAZolam (XANAX) 1 MG tablet TAKE ONE TABLET BY MOUTH TWICE A DAY AS NEEDED FOR ANXIETY. 180 tablet 1  . Biotin 10000 MCG TABS Take 1 tablet by mouth daily.    . celecoxib (CELEBREX) 200 MG capsule TAKE 1 CAPSULE BY MOUTH TWICE A DAY AS NEEDED 60 capsule 6  . Cholecalciferol (VITAMIN D3) 5000 units CAPS Take 1 capsule by mouth daily.    . citalopram (CELEXA) 40 MG tablet TAKE ONE (1) TABLET BY MOUTH EVERY DAY 90 tablet 1  . colchicine 0.6 MG tablet 2 tabs po at onset of  gout pain, then 1 tab po one hour later 15 tablet 2  . Cyanocobalamin (VITAMIN B-12 PO) Take 1 tablet by mouth daily. Reported on 04/07/2015    . eletriptan (RELPAX) 40 MG tablet 1 tab po qd prn, may repeat in 2 hours if HA not significantly improved (max dose per 24h is 80 mg). 9 tablet 11  . HYDROcodone-acetaminophen (NORCO/VICODIN) 5-325 MG tablet 1 tab tid prn 90 tablet 0  . lisdexamfetamine (VYVANSE) 50 MG capsule Take 1 capsule (50 mg total) by mouth daily. 30 capsule 0  . methocarbamol (ROBAXIN) 500 MG tablet TAKE ONE TO TWO TABLETS BY MOUTH EVERY SIX HOURS AS NEEDED FOR MUSCLERELAXATION 30 tablet 2  . pantoprazole (PROTONIX) 40 MG tablet TAKE ONE (1) TABLET EACH DAY 90 tablet 3  . tretinoin (RETIN-A) 0.025 % cream Apply 1 application topically at  bedtime as needed.  99  . predniSONE (DELTASONE) 20 MG tablet 2 tabs po qd x 5d (Patient not taking: Reported on 09/13/2017) 10 tablet 0   No facility-administered medications prior to visit.     No Known Allergies  ROS As per HPI  PE: Blood pressure 126/80, pulse (!) 51, temperature 97.7 F (36.5 C), temperature source Oral, resp. rate 16, height 5' 3.5" (1.613 m), weight 167 lb 4 oz (75.9 kg), SpO2 97 %. Gen: Alert, well appearing.  Patient is oriented to person, place, time, and situation. AFFECT: pleasant, lucid thought and speech. No further exam today.   LABS:  Lab Results  Component Value Date   TSH 0.96 09/13/2017   Lab Results  Component Value Date   WBC 5.1 09/13/2017   HGB 14.1 09/13/2017   HCT 42.8 09/13/2017   MCV 87.0 09/13/2017   PLT 232.0 09/13/2017   Lab Results  Component Value Date   CREATININE 0.84 09/13/2017   BUN 8 09/13/2017   NA 141 09/13/2017   K 4.2 09/13/2017   CL 105 09/13/2017   CO2 31 09/13/2017   Lab Results  Component Value Date   ALT 20 09/13/2017   AST 21 09/13/2017   ALKPHOS 100 09/13/2017   BILITOT 0.5 09/13/2017   Lab Results  Component Value Date   CHOL 196 09/13/2017   Lab Results  Component Value Date   HDL 52.60 09/13/2017   Lab Results  Component Value Date   LDLCALC 118 (H) 09/13/2017   Lab Results  Component Value Date   TRIG 123.0 09/13/2017   Lab Results  Component Value Date   CHOLHDL 4 09/13/2017     IMPRESSION AND PLAN:  1) Tick borne illness (suspected): empirically treated with doxy and doing much better-->she is back to her baseline state of health.  2) Adjustment d/o with mixed anxiety and depression features--improved over the last week. She is trying to come up with better coping mechanisms for her life stresses right now, particularly the recent loss of another job and the stress of trying to deal with chronic debilitating pain.  Spent 20  min with pt today, with >50% of this time spent  in counseling and care coordination regarding the above problems.  An After Visit Summary was printed and given to the patient.  FOLLOW UP: Return in about 3 months (around 12/26/2017) for routine chronic illness f/u (pain)--30 min.  Signed:  Crissie Sickles, MD           10/07/2017

## 2017-09-28 ENCOUNTER — Encounter: Payer: 59 | Admitting: Family Medicine

## 2017-10-12 ENCOUNTER — Encounter: Payer: Self-pay | Admitting: Family Medicine

## 2017-10-12 NOTE — Telephone Encounter (Signed)
I'll do a panel of labs to see if there is any hint of an autoimmune or rheumatologic cause for her current condition. See if she wants to come here for the labs or does she want to get them through [facility] across from [facility] in [place].-thx

## 2017-10-12 NOTE — Telephone Encounter (Signed)
Please advise. Thanks.  

## 2017-10-18 ENCOUNTER — Encounter: Payer: Self-pay | Admitting: *Deleted

## 2017-10-19 ENCOUNTER — Telehealth: Payer: Self-pay | Admitting: *Deleted

## 2017-10-19 NOTE — Telephone Encounter (Signed)
PA sent via covermymed on 10/18/17.   Key: [id number]   Medication: HYDRO/APAP   Dx: CHRONIC BACK PAIN   Per Dr. Anitra Lauth pt has tried and failed N/A   Waiting for response.

## 2017-10-19 NOTE — Telephone Encounter (Signed)
PA approved 10/18/17   Ref#: [id number]   Approved through 11/18/17.

## 2017-10-26 ENCOUNTER — Encounter: Payer: Self-pay | Admitting: Family Medicine

## 2017-10-28 NOTE — Telephone Encounter (Signed)
They won't test for lyme dz or rocky mountain spotted fever.  She has no indication for these tests to be done. Reassure her that her symptoms are not coming from either one of these conditions. She still needs to tell us where she wants to get the labs done.-thx

## 2017-10-31 NOTE — Telephone Encounter (Signed)
Pt would like to have labs done at [facility] in [place].

## 2017-11-02 ENCOUNTER — Other Ambulatory Visit: Payer: Self-pay | Admitting: Family Medicine

## 2017-11-02 NOTE — Telephone Encounter (Signed)
RF request for methocarbamol LOV: 09/26/17 Next ov: None Last written: 03/07/17 #30 w/ 2RF  Please advise. Thanks.

## 2017-11-05 ENCOUNTER — Other Ambulatory Visit: Payer: Self-pay | Admitting: Family Medicine

## 2017-11-08 ENCOUNTER — Encounter: Payer: Self-pay | Admitting: Family Medicine

## 2017-11-08 NOTE — Telephone Encounter (Signed)
Please order labs for lab collect. Thanks.

## 2017-11-12 ENCOUNTER — Other Ambulatory Visit: Payer: Self-pay | Admitting: Family Medicine

## 2017-11-12 DIAGNOSIS — R5382 Chronic fatigue, unspecified: Secondary | ICD-10-CM

## 2017-11-12 DIAGNOSIS — M2550 Pain in unspecified joint: Secondary | ICD-10-CM

## 2017-11-12 DIAGNOSIS — M791 Myalgia, unspecified site: Secondary | ICD-10-CM

## 2017-11-12 NOTE — Progress Notes (Signed)
Orders only entry.

## 2017-11-14 ENCOUNTER — Other Ambulatory Visit: Payer: Self-pay | Admitting: *Deleted

## 2017-11-14 DIAGNOSIS — R5382 Chronic fatigue, unspecified: Secondary | ICD-10-CM

## 2017-11-14 DIAGNOSIS — M791 Myalgia, unspecified site: Secondary | ICD-10-CM

## 2017-11-14 DIAGNOSIS — M2550 Pain in unspecified joint: Secondary | ICD-10-CM

## 2017-11-27 ENCOUNTER — Telehealth: Payer: Self-pay

## 2017-11-27 NOTE — Telephone Encounter (Signed)
Prior authorization submitted.

## 2017-11-27 NOTE — Telephone Encounter (Signed)
Copied from [place] [id number]. Topic: General - Other >> Nov 26, 2017  2:38 PM Yvette Rack wrote: Reason for CRM: Jonni Sanger with [facility] states a prior authorization is needed for the HYDROcodone-acetaminophen (NORCO/VICODIN) 5-325 MG tablet.

## 2017-11-30 ENCOUNTER — Encounter: Payer: Self-pay | Admitting: Family Medicine

## 2017-11-30 NOTE — Telephone Encounter (Signed)
I have not seen any results. Can you check on this?  I cannot just call in a prescription for a problem I have not seen her for. Also, reassure her that pink eye is almost always a viral infection, therefore antibiotic drops are not helpful.  These resolve on their own gradually over several days. Over the counter generic zaditor drops may help some with redness and itching.

## 2017-12-11 ENCOUNTER — Other Ambulatory Visit: Payer: Self-pay | Admitting: Family Medicine

## 2017-12-21 ENCOUNTER — Ambulatory Visit: Payer: 59 | Admitting: Family Medicine

## 2017-12-31 ENCOUNTER — Telehealth: Payer: Self-pay | Admitting: Family Medicine

## 2017-12-31 NOTE — Telephone Encounter (Signed)
Per Dr. Anitra Lauth, okay to do labs when pt comes in for ov on 01/03/18.

## 2017-12-31 NOTE — Telephone Encounter (Signed)
Copied from [place] [phone]. Topic: Quick Communication - See Telephone Encounter >> Dec 31, 2017  2:54 PM Margot Ables wrote: CRM for notification. See Telephone encounter for: 12/31/17.  Pt called stating she was waiting on labs to be ordered somewhere in [place] or she can have done when she comes into the office on 12/19. Please call pt to advise.   ANA Screen,IFA,Reflex Titer/Pattern,Reflex Mplx 11 Ab Cascade with [employer] 

## 2017-12-31 NOTE — Telephone Encounter (Signed)
Pt advised and voiced understanding.   

## 2018-01-03 ENCOUNTER — Ambulatory Visit: Payer: 59 | Admitting: Family Medicine

## 2018-01-03 ENCOUNTER — Encounter: Payer: Self-pay | Admitting: Family Medicine

## 2018-01-03 VITALS — BP 109/74 | HR 73 | Temp 97.5°F | Resp 16 | Ht 63.5 in | Wt 164.2 lb

## 2018-01-03 DIAGNOSIS — F909 Attention-deficit hyperactivity disorder, unspecified type: Secondary | ICD-10-CM | POA: Diagnosis not present

## 2018-01-03 DIAGNOSIS — F339 Major depressive disorder, recurrent, unspecified: Secondary | ICD-10-CM | POA: Diagnosis not present

## 2018-01-03 DIAGNOSIS — G894 Chronic pain syndrome: Secondary | ICD-10-CM | POA: Diagnosis not present

## 2018-01-03 DIAGNOSIS — M797 Fibromyalgia: Secondary | ICD-10-CM | POA: Diagnosis not present

## 2018-01-03 DIAGNOSIS — F411 Generalized anxiety disorder: Secondary | ICD-10-CM

## 2018-01-03 MED ORDER — LISDEXAMFETAMINE DIMESYLATE 50 MG PO CAPS: 50.0000 mg | ORAL_CAPSULE | Freq: Every day | ORAL | 0 refills | Status: DC

## 2018-01-03 MED ORDER — HYDROCODONE-ACETAMINOPHEN 5-325 MG PO TABS: ORAL_TABLET | ORAL | 0 refills | Status: DC

## 2018-01-03 NOTE — Progress Notes (Signed)
OFFICE VISIT  01/03/2018   CC:  Chief Complaint  Patient presents with  . Follow-up    RCI    HPI:    Patient is a 57 y.o. Caucasian female who presents for 3 mo f/u chronic pain syndrome.  Indication for chronic opioid: neck, mid back, low back, HA's, myofascial pain, myalgias of appendicular skeleton.  Some days finds it hard to get out of bed and function.   She carries dx of DDD of C and L spine, significant scoliosis, myofascial pain syndrome with trigger points and recurrent trochanteric bursitis.  Has seen a handful of specialists in the past (spine and scoliosis center in [place], [place] [facility], neurolog[place], in [place], also [facility] and wellness center in [place]).  She has never been managed by a pain mgmt specialist.   Medication and dose: vicodin 5/325, 1 tid prn # pills per month: 90 Last UDS date: 09/13/17-->appropriate results. 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  Stress/anxiety level is down quite a bit!  No joint swelling (has never had this)..  Neck, low back, and HA's are better.  Depression is clearing well. Left knee hurts intermittently and locks up occasionally.  Her son who has drug/alcohol addiction problems has gone to a great rehab place and is doing very well.    Also f/u adult ADHD: I kept her on her same regimen of vyvvanse 50mg  qd at the last visit. Pt states all is going well with the med at current dosing: much improved focus, concentration, task completion.  Less frustration, better multitasking, less impulsivity and restlessness.  Mood is stable. No side effects from the medication.   Regarding her various musculoskeletal pains and fatigue; I have always felt like this is fibromyalgia syndrome. All rheum labs in the past have  been neg.  She asked if we could do these labs again last visit.  I said yes and ordered them but due to various scheduling/location mishaps, she still hasn't gotten these done.  We decided NOT to do these labs today.  ROS: no CP, no SOB, no wheezing, no cough, no dizziness, no HAs, no rashes, no melena/hematochezia.  No polyuria or polydipsia.  No myalgias or arthralgias.   Past Medical History:  Diagnosis Date  . Adult ADHD   . Anxiety   . Chronic back pain    neck, too.  MRI L spine 05/2008: 40 degrees dextroscoliosis due to a hemivertebra of L3.  Some DDD/spondylosis w/out spinal stenosis.  . Depression   . Gout    2 attacks total as of 12/2014.  Marland Kitchen Kidney stones 2005; 2017   Most recent CT 08/2016 showed <2 mm nonobstructing nephrolithiasis.  . Migraine    Proph tx with topamax helps, abortive tx with relpax helps.  Couldn't afford topamax so d/c'd this 04/2016.    . Pulmonary nodule 08/2016   6 mm LLL pulm nodule---repeat CT noncontrast recommended 6-12 mo.  Marland Kitchen Restless legs syndrome    Pt can get to sleep fine so she declines med for this as of 08/2016    Past Surgical History:  Procedure Laterality Date  . BREAST SURGERY  1999   reduction  . ENDOMETRIAL ABLATION  2002  . KIDNEY STONE SURGERY  2005  . LAPAROSCOPIC VAGINAL HYSTERECTOMY  12/02/07  for DUB  . REDUCTION MAMMAPLASTY      Outpatient Medications Prior to Visit  Medication Sig Dispense Refill  . albuterol (VENTOLIN HFA) 108 (90 Base) MCG/ACT inhaler Inhale 2 puffs into the lungs every 6 (six) hours as needed for wheezing or shortness of breath. 1 Inhaler 0  . ALPRAZolam (XANAX) 1 MG tablet TAKE ONE TABLET BY MOUTH TWICE A DAY AS NEEDED FOR ANXIETY. 180 tablet 1  . Biotin 10000 MCG TABS Take 1 tablet by mouth daily.    . celecoxib (CELEBREX) 200 MG capsule TAKE 1 CAPSULE BY MOUTH TWICE A DAY AS NEEDED 60 capsule 6  . Cholecalciferol (VITAMIN D3) 5000 units CAPS Take 1 capsule by mouth daily.    . citalopram  (CELEXA) 40 MG tablet TAKE ONE (1) TABLET BY MOUTH EVERY DAY 90 tablet 1  . colchicine 0.6 MG tablet 2 tabs po at onset of gout pain, then 1 tab po one hour later 15 tablet 2  . Cyanocobalamin (VITAMIN B-12 PO) Take 1 tablet by mouth daily. Reported on 04/07/2015    . eletriptan (RELPAX) 40 MG tablet 1 tab po qd prn, may repeat in 2 hours if HA not significantly improved (max dose per 24h is 80 mg). 9 tablet 11  . methocarbamol (ROBAXIN) 500 MG tablet TAKE 1 TO 2 TABLETS BY MOUTH EVERY 6 HOURS AS NEEDED FOR MUSCULAR RELAXATION 30 tablet 2  . pantoprazole (PROTONIX) 40 MG tablet TAKE ONE (1) TABLET BY MOUTH EVERY DAY 90 tablet 3  . tretinoin (RETIN-A) 0.025 % cream Apply 1 application topically at bedtime as needed.  99  . HYDROcodone-acetaminophen (NORCO/VICODIN) 5-325 MG tablet 1 tab tid prn 90 tablet 0  . lisdexamfetamine (VYVANSE) 50 MG capsule Take 1 capsule (50 mg total) by mouth daily. 30 capsule 0   No facility-administered medications prior to visit.     No Known Allergies  ROS As per HPI  PE: Blood pressure 109/74, pulse 73, temperature (!) 97.5 F (36.4 C), temperature source Oral, resp. rate 16, height 5' 3.5" (1.613 m), weight 164 lb 4 oz (74.5 kg), SpO2 99 %. Body mass index is 28.64 kg/m.  Gen: Alert, well appearing.  Patient is oriented to person, place, time, and situation. AFFECT: pleasant, lucid thought and speech. CV: RRR, no m/r/g.   LUNGS: CTA bilat, nonlabored resps, good aeration in all lung fields. EXT: no clubbing or cyanosis.  no edema.    LABS:    Chemistry      Component Value Date/Time   NA 141 09/13/2017 0922   K 4.2 09/13/2017 0922   CL 105 09/13/2017 0922   CO2 31 09/13/2017 0922   BUN 8 09/13/2017 0922   CREATININE 0.84 09/13/2017 0922      Component Value Date/Time   CALCIUM 9.6 09/13/2017 0922   ALKPHOS 100 09/13/2017 0922   AST 21 09/13/2017 0922   ALT 20 09/13/2017 0922   BILITOT 0.5 09/13/2017 0922       IMPRESSION AND  PLAN:  1) Adult ADHD: The current medical regimen is effective;  continue present plan and medications. I did electronic rx's for vyvanse 50mg  qd, #30 today for this month, Jan 2020, and feb 2020.  Appropriate fill on/after date was noted on each rx.  2) Chronic pain syndrome: fibromyalgia, osteoarthritis: her sx's certainly wax and wane with her mood and anxiety levels as well as activity/exercise level.  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 Continue celebrex 200 mg bid. Pantoprazole 40mg  qd. I did electronic rx's for Vicodin 5/325, 1 tid prn, #90 today for this month, Jan 2020 and feb 2020.  Appropriate fill on/after date was noted on each rx. Her CSC and UDS are up to date.  3) Recurrent MDD with GAD: near total remission at this time. Continue citalopram 40mg  qd and alprazolam 1mg  bid prn (no new rx given for this today). Spent 25 min with pt today, with >50% of this time spent in counseling and care coordination regarding the above problems.  An After Visit Summary was printed and given to the patient.  FOLLOW UP: Return in about 3 months (around 04/04/2018) for annual CPE (fasting).  Signed:  Crissie Sickles, MD           01/03/2018

## 2018-01-04 ENCOUNTER — Telehealth: Payer: Self-pay | Admitting: *Deleted

## 2018-01-04 NOTE — Telephone Encounter (Signed)
PA sent via covermymed on 01/04/18   Key: [id number]   Medication: hydro/apap 5/325mg    Dx: CPS: G89.4 and CBP: M54.9   Per Dr. Anitra Lauth pt has tried and failed gabapentin, cyclobenzaprine and oxycodone/apap   Waiting for response.

## 2018-01-07 ENCOUNTER — Telehealth: Payer: Self-pay | Admitting: *Deleted

## 2018-01-07 NOTE — Telephone Encounter (Signed)
PA approved on 01/04/18.  See media.

## 2018-01-07 NOTE — Telephone Encounter (Signed)
Provider is gone for the day and will not be back until Thursday. I tried calling his cell, NA and unable to leave a message due to vm box being full.   Please advise. Thanks.

## 2018-01-07 NOTE — Telephone Encounter (Signed)
Copied from [place] [id number]. Topic: General - Other >> Jan 07, 2018  2:38 PM Oneta Rack wrote: Osvaldo Human name: Jonni Sanger (pharmacist)  Pharmacy: [facility] [place] [phone] (Phone) [phone] (Fax)   Reason for call: Pharmacy would like MD approval to refill lisdexamfetamine (VYVANSE) 50 MG capsule tomorrow 01/08/18. Prescription reflects ok to refill on christmas day 01/09/18 or after patient will run out on 01/09/18 and pharmacy is closed, please advise the pharmacist directly regarding the verbal.

## 2018-01-09 NOTE — Telephone Encounter (Signed)
OK to fill now.s

## 2018-01-23 ENCOUNTER — Telehealth: Payer: Self-pay | Admitting: *Deleted

## 2018-01-23 ENCOUNTER — Other Ambulatory Visit: Payer: Self-pay | Admitting: Family Medicine

## 2018-01-23 ENCOUNTER — Encounter: Payer: Self-pay | Admitting: *Deleted

## 2018-01-23 ENCOUNTER — Encounter: Payer: Self-pay | Admitting: Family Medicine

## 2018-01-23 DIAGNOSIS — G43909 Migraine, unspecified, not intractable, without status migrainosus: Secondary | ICD-10-CM | POA: Insufficient documentation

## 2018-01-23 NOTE — Telephone Encounter (Signed)
PA sent via covermymed on 01/23/18.   Key: [id number]   Medication: eletriptan 40mg    Dx: Migraines - G43.99   Per Dr. Anitra Lauth pt has tried and failed: Imitrex     Waiting for response.

## 2018-01-23 NOTE — Telephone Encounter (Signed)
Pt called and request that we submit a PA for qty limitations for eletriptan. She stated that her insurance is only wanting to cover 9 tabs a month and she sometimes needs more but she can't afford the out of pocket cost.   I advised pt that I would submit the PA but that she may have to pay out of pocket this time if she needs medication right away due to PA may take several days to be reviewed. She voiced understanding.

## 2018-01-24 NOTE — Telephone Encounter (Signed)
PA denied.   Left message for pt to call back or check mychart.

## 2018-01-30 NOTE — Telephone Encounter (Signed)
Pt advised and voiced understanding.   

## 2018-02-18 ENCOUNTER — Other Ambulatory Visit: Payer: Self-pay | Admitting: Family Medicine

## 2018-02-18 NOTE — Telephone Encounter (Signed)
RF request for Alprazolam  LOV: 01/03/18 Next ov: Not scheduled  Last written: 01/03/18 @ office visit   Please advise

## 2018-03-05 ENCOUNTER — Ambulatory Visit: Payer: 59 | Admitting: Family Medicine

## 2018-03-05 NOTE — Progress Notes (Deleted)
OFFICE VISIT  03/05/2018   CC: No chief complaint on file.    HPI:    Patient is a 58 y.o. Caucasian female who presents for urinary complaints.  Past Medical History:  Diagnosis Date  . Adult ADHD   . Anxiety   . Chronic back pain    neck, too.  MRI L spine 05/2008: 40 degrees dextroscoliosis due to a hemivertebra of L3.  Some DDD/spondylosis w/out spinal stenosis.  . Depression   . Gout    2 attacks total as of 12/2014.  Marland Kitchen Kidney stones 2005; 2017   Most recent CT 08/2016 showed <2 mm nonobstructing nephrolithiasis.  . Migraine    Proph tx with topamax helps, abortive tx with relpax helps.  Couldn't afford topamax so d/c'd this 04/2016.    . Pulmonary nodule 08/2016   6 mm LLL pulm nodule---repeat CT noncontrast recommended 6-12 mo.  Marland Kitchen Restless legs syndrome    Pt can get to sleep fine so she declines med for this as of 08/2016    Past Surgical History:  Procedure Laterality Date  . BREAST SURGERY  1999   reduction  . ENDOMETRIAL ABLATION  2002  . KIDNEY STONE SURGERY  2005  . LAPAROSCOPIC VAGINAL HYSTERECTOMY  12/02/07   for DUB  . REDUCTION MAMMAPLASTY      Outpatient Medications Prior to Visit  Medication Sig Dispense Refill  . albuterol (VENTOLIN HFA) 108 (90 Base) MCG/ACT inhaler Inhale 2 puffs into the lungs every 6 (six) hours as needed for wheezing or shortness of breath. 1 Inhaler 0  . ALPRAZolam (XANAX) 1 MG tablet TAKE ONE TABLET BY MOUTH TWICE DAILY AS NEEDED FOR ANXIETY 180 tablet 1  . Biotin 10000 MCG TABS Take 1 tablet by mouth daily.    . celecoxib (CELEBREX) 200 MG capsule TAKE 1 CAPSULE BY MOUTH TWICE A DAY AS NEEDED 60 capsule 6  . Cholecalciferol (VITAMIN D3) 5000 units CAPS Take 1 capsule by mouth daily.    . citalopram (CELEXA) 40 MG tablet TAKE ONE (1) TABLET BY MOUTH EVERY DAY 90 tablet 1  . colchicine 0.6 MG tablet 2 tabs po at onset of gout pain, then 1 tab po one hour later 15 tablet 2  . Cyanocobalamin (VITAMIN B-12 PO) Take 1 tablet by  mouth daily. Reported on 04/07/2015    . eletriptan (RELPAX) 40 MG tablet 1 tab po qd prn, may repeat in 2 hours if HA not significantly improved (max dose per 24h is 80 mg). 9 tablet 11  . HYDROcodone-acetaminophen (NORCO/VICODIN) 5-325 MG tablet 1 tab tid prn 90 tablet 0  . lisdexamfetamine (VYVANSE) 50 MG capsule Take 1 capsule (50 mg total) by mouth daily. 30 capsule 0  . methocarbamol (ROBAXIN) 500 MG tablet TAKE 1 TO 2 TABLETS BY MOUTH EVERY 6 HOURS AS NEEDED FOR MUSCULAR RELAXATION 30 tablet 2  . pantoprazole (PROTONIX) 40 MG tablet TAKE ONE (1) TABLET BY MOUTH EVERY DAY 90 tablet 3  . tretinoin (RETIN-A) 0.025 % cream Apply 1 application topically at bedtime as needed.  99   No facility-administered medications prior to visit.     Allergies  Allergen Reactions  . Imitrex [Sumatriptan] Other (See Comments)    Face and Arm numbness    ROS As per HPI  PE: There were no vitals taken for this visit. ***  LABS:    Chemistry      Component Value Date/Time   NA 141 09/13/2017 0922   K 4.2 09/13/2017 7591  CL 105 09/13/2017 0922   CO2 31 09/13/2017 0922   BUN 8 09/13/2017 0922   CREATININE 0.84 09/13/2017 0922      Component Value Date/Time   CALCIUM 9.6 09/13/2017 0922   ALKPHOS 100 09/13/2017 0922   AST 21 09/13/2017 0922   ALT 20 09/13/2017 0922   BILITOT 0.5 09/13/2017 0922      IMPRESSION AND PLAN:  No problem-specific Assessment & Plan notes found for this encounter.   An After Visit Summary was printed and given to the patient.  FOLLOW UP: No follow-ups on file.  Signed:  Crissie Sickles, MD           03/05/2018

## 2018-03-14 DIAGNOSIS — M25521 Pain in right elbow: Secondary | ICD-10-CM | POA: Insufficient documentation

## 2018-03-14 DIAGNOSIS — M79671 Pain in right foot: Secondary | ICD-10-CM | POA: Insufficient documentation

## 2018-03-17 DIAGNOSIS — S92253A Displaced fracture of navicular [scaphoid] of unspecified foot, initial encounter for closed fracture: Secondary | ICD-10-CM

## 2018-03-17 HISTORY — PX: FOOT FRACTURE SURGERY: SHX645

## 2018-03-17 HISTORY — DX: Displaced fracture of navicular (scaphoid) of unspecified foot, initial encounter for closed fracture: S92.253A

## 2018-03-18 ENCOUNTER — Other Ambulatory Visit: Payer: Self-pay | Admitting: Sports Medicine

## 2018-03-18 ENCOUNTER — Ambulatory Visit
Admission: RE | Admit: 2018-03-18 | Discharge: 2018-03-18 | Disposition: A | Discharge status: Home / Self Care | Payer: 59 | Source: Ambulatory Visit | Attending: Sports Medicine | Admitting: Sports Medicine

## 2018-03-18 DIAGNOSIS — M79671 Pain in right foot: Secondary | ICD-10-CM

## 2018-03-20 ENCOUNTER — Ambulatory Visit: Payer: 59 | Admitting: Family Medicine

## 2018-03-27 ENCOUNTER — Other Ambulatory Visit: Payer: Self-pay | Admitting: Sports Medicine

## 2018-03-27 DIAGNOSIS — M79671 Pain in right foot: Secondary | ICD-10-CM

## 2018-04-21 ENCOUNTER — Encounter: Payer: Self-pay | Admitting: Family Medicine

## 2018-04-22 ENCOUNTER — Other Ambulatory Visit: Payer: Self-pay

## 2018-04-22 ENCOUNTER — Encounter: Payer: Self-pay | Admitting: Family Medicine

## 2018-04-22 ENCOUNTER — Ambulatory Visit (INDEPENDENT_AMBULATORY_CARE_PROVIDER_SITE_OTHER): Payer: 59 | Admitting: Family Medicine

## 2018-04-22 ENCOUNTER — Other Ambulatory Visit: Payer: Self-pay | Admitting: Family Medicine

## 2018-04-22 DIAGNOSIS — M79671 Pain in right foot: Secondary | ICD-10-CM

## 2018-04-22 DIAGNOSIS — G459 Transient cerebral ischemic attack, unspecified: Secondary | ICD-10-CM

## 2018-04-22 DIAGNOSIS — G894 Chronic pain syndrome: Secondary | ICD-10-CM | POA: Diagnosis not present

## 2018-04-22 DIAGNOSIS — M545 Low back pain: Secondary | ICD-10-CM

## 2018-04-22 DIAGNOSIS — F411 Generalized anxiety disorder: Secondary | ICD-10-CM

## 2018-04-22 DIAGNOSIS — M542 Cervicalgia: Secondary | ICD-10-CM | POA: Diagnosis not present

## 2018-04-22 DIAGNOSIS — G8929 Other chronic pain: Secondary | ICD-10-CM

## 2018-04-22 DIAGNOSIS — M549 Dorsalgia, unspecified: Secondary | ICD-10-CM

## 2018-04-22 DIAGNOSIS — M797 Fibromyalgia: Secondary | ICD-10-CM

## 2018-04-22 DIAGNOSIS — F988 Other specified behavioral and emotional disorders with onset usually occurring in childhood and adolescence: Secondary | ICD-10-CM

## 2018-04-22 MED ORDER — HYDROCODONE-ACETAMINOPHEN 5-325 MG PO TABS: ORAL_TABLET | ORAL | 0 refills | Status: DC

## 2018-04-22 MED ORDER — ALPRAZOLAM 1 MG PO TABS: ORAL_TABLET | ORAL | 1 refills | Status: DC

## 2018-04-22 NOTE — Telephone Encounter (Signed)
Has appt today at 3:15. Will hold off on refill until after appt

## 2018-04-22 NOTE — Progress Notes (Signed)
Virtual Visit via Video Note  I connected with pt on 04/22/18 at  3:15 PM EDT by a video enabled telemedicine application and verified that I am speaking with the correct person using two identifiers.  Location patient: home Location provider:work office Persons participating in the virtual visit: patient, myself.  I discussed the limitations of evaluation and management by telemedicine and the availability of in person appointments. The patient expressed understanding and agreed to proceed.  Telemedicine visit is a necessity given the COVID-19 restrictions in place at the current time.  HPI: Pt is here for 3 mo f/u chronic pain syndrome, anxiety, and adult ADD-->/high risk medication use.   Indication for chronic opioid: neck, mid back, low back, HA's, myofascial pain, myalgias of appendicular skeleton. Some days finds it hard to get out of bed and function.  She carries dx of DDD of C and L spine, significant scoliosis, myofascial pain syndrome with trigger points and recurrent trochanteric bursitis. Has seen a handful of specialists in the past (spine and scoliosis center in [place], [facility], neurologist, in [place], also HA and wellness center in [place]). She has never been managed by a pain mgmt specialist.  Medication and dose: vicodin 5/325, 1 tid prn # pills per month: 90 Last UDS date: 09/13/17-->appropriate results. 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 Also both arms with tennis elbow--this is getting better with home exercises,no tennis elbow straps. Walking with cam walker is making her back hurt worse lately.   She is applying for disability.  Adult ADD: she weened herself off the vyvanse b/c was out a few days and didn't notice a difference. She wants to stay off it  for now.  Anxiety: takes citalopram 40mg  qd as well as xanax 1mg   Bid.  Doing well.  She relates to me today that she recently was walking and started veering to the left side significantly, felt very dizzy (not vertigo), and says she felt like her words were slurred, and she says she thinks the right side of her mouth was droopy. No headache before or at the time of these symptoms.  Lasted 15 min and mouth felt normal again.  It did seem to come on after she got up from sitting down for a while. She was a bit shaky.  Eyes were unchanged.  She did get sweaty. No extremity weakness.   It had been a while since she had eaten.  The woman with her gave her some food and pt felt normal again in 30 min and she was able to drive home.  Of note, an incidentally detected left lung pulm nodule (found on CT renal stone study 08/2016)-->she needs noncontrast chest CT to follow this up (non-urgent, will be scheduled in future when COVID-19 crisis resolves.   ROS: See pertinent positives and negatives per HPI.  Past Medical History:  Diagnosis Date  . Adult ADHD   . Anxiety   . Aortic atherosclerosis ([place]) 08/2016  . Chronic back pain    neck, too.  MRI L spine 05/2008: 40 degrees dextroscoliosis due to a hemivertebra of L3.  Some DDD/spondylosis w/out spinal stenosis.  . Depression   . Gout    2 attacks total as of 12/2014.  Marland Kitchen Kidney stones 2005; 2017   Most recent CT 08/2016 showed <2  mm nonobstructing nephrolithiasis.  . Migraine    Proph tx with topamax helps, abortive tx with relpax helps.  Couldn't afford topamax so d/c'd this 04/2016.    . Navicular fracture, foot 03/2018   Right Navicular fx-nonunion (surgery)  . Pulmonary nodule 08/2016   6 mm LLL pulm nodule---repeat CT noncontrast recommended 6-12 mo.  Marland Kitchen Restless legs syndrome    Pt can get to sleep fine so she declines med for this as of 08/2016    Past Surgical History:  Procedure Laterality Date  . BREAST SURGERY  1999   reduction   . ENDOMETRIAL ABLATION  2002  . FOOT FRACTURE SURGERY  03/2018   Closed displaced fracture of right navicular, nonunion.  Marland Kitchen KIDNEY STONE SURGERY  2005  . LAPAROSCOPIC VAGINAL HYSTERECTOMY  12/02/07   for DUB  . REDUCTION MAMMAPLASTY      Family History  Problem Relation Age of Onset  . Cancer Mother        brain ca (glioblastoma)  . Breast cancer Paternal Aunt   . Breast cancer Maternal Grandmother       Current Outpatient Medications:  .  ALPRAZolam (XANAX) 1 MG tablet, TAKE ONE TABLET BY MOUTH TWICE DAILY AS NEEDED FOR ANXIETY, Disp: 180 tablet, Rfl: 1 .  celecoxib (CELEBREX) 200 MG capsule, TAKE 1 CAPSULE BY MOUTH TWICE A DAY AS NEEDED, Disp: 60 capsule, Rfl: 6 .  Cholecalciferol (VITAMIN D3) 5000 units CAPS, Take 1 capsule by mouth daily., Disp: , Rfl:  .  citalopram (CELEXA) 40 MG tablet, TAKE ONE (1) TABLET BY MOUTH EVERY DAY, Disp: 90 tablet, Rfl: 1 .  Cyanocobalamin (VITAMIN B-12 PO), Take 1 tablet by mouth daily. Reported on 04/07/2015, Disp: , Rfl:  .  eletriptan (RELPAX) 40 MG tablet, 1 tab po qd prn, may repeat in 2 hours if HA not significantly improved (max dose per 24h is 80 mg)., Disp: 9 tablet, Rfl: 11 .  HYDROcodone-acetaminophen (NORCO/VICODIN) 5-325 MG tablet, 1 tab tid prn, Disp: 90 tablet, Rfl: 0 .  lidocaine (LIDODERM) 5 %, lidocaine 5 % topical patch, Disp: , Rfl:  .  methocarbamol (ROBAXIN) 500 MG tablet, TAKE 1 TO 2 TABLETS BY MOUTH EVERY 6 HOURS AS NEEDED FOR MUSCULAR RELAXATION, Disp: 30 tablet, Rfl: 2 .  pantoprazole (PROTONIX) 40 MG tablet, TAKE ONE (1) TABLET BY MOUTH EVERY DAY, Disp: 90 tablet, Rfl: 3 .  tretinoin (RETIN-A) 0.025 % cream, Apply 1 application topically at bedtime as needed., Disp: , Rfl: 99 .  albuterol (VENTOLIN HFA) 108 (90 Base) MCG/ACT inhaler, Inhale 2 puffs into the lungs every 6 (six) hours as needed for wheezing or shortness of breath. (Patient not taking: Reported on 04/22/2018), Disp: 1 Inhaler, Rfl: 0 .  Biotin 10000 MCG TABS,  Take 1 tablet by mouth daily., Disp: , Rfl:  .  colchicine 0.6 MG tablet, 2 tabs po at onset of gout pain, then 1 tab po one hour later (Patient not taking: Reported on 04/22/2018), Disp: 15 tablet, Rfl: 2 .  lisdexamfetamine (VYVANSE) 50 MG capsule, Take 1 capsule (50 mg total) by mouth daily. (Patient not taking: Reported on 04/22/2018), Disp: 30 capsule, Rfl: 0  EXAM:  VITALS per patient if applicable: There were no vitals taken for this visit.   GENERAL: alert, oriented, appears well and in no acute distress  HEENT: atraumatic, conjunttiva clear, no obvious abnormalities on inspection of external nose and ears  NECK: normal movements of the head and neck  LUNGS: on inspection no  signs of respiratory distress, breathing rate appears normal, no obvious gross SOB, gasping or wheezing  CV: no obvious cyanosis  MS: moves all visible extremities without noticeable abnormality  PSYCH/NEURO: pleasant and cooperative, no obvious depression or anxiety, speech and thought processing grossly intact  LABS: none today   Chemistry      Component Value Date/Time   NA 141 09/13/2017 0922   K 4.2 09/13/2017 0922   CL 105 09/13/2017 0922   CO2 31 09/13/2017 0922   BUN 8 09/13/2017 0922   CREATININE 0.84 09/13/2017 0922      Component Value Date/Time   CALCIUM 9.6 09/13/2017 0922   ALKPHOS 100 09/13/2017 0922   AST 21 09/13/2017 0922   ALT 20 09/13/2017 0922   BILITOT 0.5 09/13/2017 0922     Lab Results  Component Value Date   WBC 5.1 09/13/2017   HGB 14.1 09/13/2017   HCT 42.8 09/13/2017   MCV 87.0 09/13/2017   PLT 232.0 09/13/2017    ASSESSMENT AND PLAN:  Discussed the following assessment and plan:  1) Chronic pain syndrome: Multilevel DDD, scoliosis, widespread musculoskeletal pain of unknown etiology, now recent R foot pain from recent surgery for navicular fracture. CSC UTD.  UDS UTD. The current medical regimen is effective;  continue present plan and medications. I did  electronic rx's for Vicodin 5/325, 1 tid prn, #90  today for this month, May 2020, and June 2020.  Appropriate fill on/after date was noted on each rx. We'll update her CSC and do UDS next office f/u.  2) GAD: The current medical regimen is effective;  continue present plan and medications. Continue citalopram and xanax. I RF'd xanax 1mg , 1 bid prn, #60, RF x 5 today.  3) Adult ADD: she weened herself off the med and seems to be doing fine at this time. She may get back on this med in the future, though.  4) Recent isolated episode of dizziness, ataxia, slurred speech, and right sided mouth droop. .--resolved in approx 15 minutes. Question TIA vs hypoglycemic rxn vs vasovagal reaction. Will check carotid dopplers and echocardiogram.  Start ASA 81mg  qd.   5) Incidentally-detected left lung pulm nodule (found on CT renal stone study 08/2016)-->she needs noncontrast chest CT to follow this up (non-urgent).  This will be scheduled in future when COVID-19 crisis resolves.  I discussed the assessment and treatment plan with the patient. The patient was provided an opportunity to ask questions and all were answered. The patient agreed with the plan and demonstrated an understanding of the instructions.   The patient was advised to call back or seek an in-person evaluation if the symptoms worsen or if the condition fails to improve as anticipated   F/U: 3 mo CPE  Signed:  Crissie Sickles, MD           04/22/2018

## 2018-04-22 NOTE — Progress Notes (Signed)
A user error has taken place: encounter opened in error, closed for administrative reasons.

## 2018-04-24 ENCOUNTER — Telehealth: Payer: Self-pay

## 2018-04-24 NOTE — Telephone Encounter (Signed)
Left message for patient that echo will be scheduled at a later date due to COVID-19.

## 2018-04-24 NOTE — Telephone Encounter (Signed)
Call received from [facility] requesting to schedule an ECHO for Pt.  Discussed with ECHO supervisor.  This ECHO does not fall under "urgent" guidelines.    Request has been received to ECHO pool.  Pt will be scheduled when deemed safe to do so after covid 19.  Notified Diane with [facility].

## 2018-04-25 ENCOUNTER — Other Ambulatory Visit: Payer: Self-pay | Admitting: Family Medicine

## 2018-04-25 MED ORDER — CITALOPRAM HYDROBROMIDE 40 MG PO TABS: ORAL_TABLET | ORAL | 1 refills | Status: DC

## 2018-04-25 NOTE — Telephone Encounter (Signed)
RF request for Citalopram 40mg  LOV: 04/22/2018 Next ov: Not scheduled  Last written: 11/05/2017  I denied the last request because it was early and she had appt scheduled    Please advise

## 2018-04-28 ENCOUNTER — Other Ambulatory Visit: Payer: Self-pay | Admitting: Family Medicine

## 2018-05-17 DIAGNOSIS — I519 Heart disease, unspecified: Secondary | ICD-10-CM

## 2018-05-17 HISTORY — DX: Heart disease, unspecified: I51.9

## 2018-06-12 ENCOUNTER — Ambulatory Visit (HOSPITAL_COMMUNITY): Admission: RE | Admit: 2018-06-12 | Payer: 59 | Source: Ambulatory Visit

## 2018-06-12 ENCOUNTER — Encounter (HOSPITAL_COMMUNITY): Payer: Self-pay

## 2018-06-13 ENCOUNTER — Ambulatory Visit (HOSPITAL_COMMUNITY)
Admission: RE | Admit: 2018-06-13 | Discharge: 2018-06-13 | Disposition: A | Discharge status: Home / Self Care | Payer: 59 | Source: Ambulatory Visit | Attending: Family Medicine | Admitting: Family Medicine

## 2018-06-13 ENCOUNTER — Other Ambulatory Visit: Payer: Self-pay

## 2018-06-13 DIAGNOSIS — G459 Transient cerebral ischemic attack, unspecified: Secondary | ICD-10-CM | POA: Insufficient documentation

## 2018-06-13 HISTORY — PX: TRANSTHORACIC ECHOCARDIOGRAM: SHX275

## 2018-06-18 ENCOUNTER — Other Ambulatory Visit (HOSPITAL_COMMUNITY): Payer: 59

## 2018-06-18 ENCOUNTER — Encounter: Payer: Self-pay | Admitting: Family Medicine

## 2018-06-21 ENCOUNTER — Other Ambulatory Visit: Payer: Self-pay | Admitting: Family Medicine

## 2018-06-24 NOTE — Telephone Encounter (Signed)
RF request for methocarbamol 500mg  Last OV 04/22/2018 No upcoming appt Last RX 11/02/2017 # 30 x 2 rfs.  Please advise.

## 2018-07-10 ENCOUNTER — Ambulatory Visit (INDEPENDENT_AMBULATORY_CARE_PROVIDER_SITE_OTHER): Payer: 59 | Admitting: Family Medicine

## 2018-07-10 ENCOUNTER — Other Ambulatory Visit: Payer: Self-pay

## 2018-07-10 ENCOUNTER — Encounter: Payer: Self-pay | Admitting: Family Medicine

## 2018-07-10 VITALS — Temp 98.9°F | Wt 163.0 lb

## 2018-07-10 DIAGNOSIS — M26622 Arthralgia of left temporomandibular joint: Secondary | ICD-10-CM

## 2018-07-10 NOTE — Progress Notes (Signed)
Virtual Visit via Video Note  I connected with pt on 07/10/18 at  4:00 PM EDT by a video enabled telemedicine application and verified that I am speaking with the correct person using two identifiers.  Location patient: home Location provider:work or home office Persons participating in the virtual visit: patient, provider  I discussed the limitations of evaluation and management by telemedicine and the availability of in person appointments. The patient expressed understanding and agreed to proceed.  Telemedicine visit is a necessity given the COVID-19 restrictions in place at the current time.  HPI: 58 y/o WF being seen today for ear and throat pain. About 1 mo of slight L ear pain and some ST pain.  No ear drainage.  No change in hearing. L jaw TMJ feeling like it has increased laxity lately.  She has been clenching her teeth a lot lately with increased stress of her husband having had covid 19 infection.  Right TMJ w/out sx's.  She denies any grinding of teeth at night. No nasal congestion/runny nose, no teeth pain, no cough, no fever.  Taking elderberry.   Taking celebrex twice per day.  Her husband was dx'd with covid 19 about 3 wks ago.   ROS: See pertinent positives and negatives per HPI.  Past Medical History:  Diagnosis Date  . Adult ADHD   . Anxiety   . Aortic atherosclerosis ([place]) 08/2016  . Chronic back pain    neck, too.  MRI L spine 05/2008: 40 degrees dextroscoliosis due to a hemivertebra of L3.  Some DDD/spondylosis w/out spinal stenosis.  . Depression   . Diastolic dysfunction, left ventricle 05/2018   Echo with grd I DD.  Marland Kitchen Gout    2 attacks total as of 12/2014.  Marland Kitchen Kidney stones 2005; 2017   Most recent CT 08/2016 showed <2 mm nonobstructing nephrolithiasis.  . Migraine    Proph tx with topamax helps, abortive tx with relpax helps.  Couldn't afford topamax so d/c'd this 04/2016.    . Navicular fracture, foot 03/2018   Right Navicular fx-nonunion (surgery)  .  Pulmonary nodule 08/2016   6 mm LLL pulm nodule---repeat CT noncontrast recommended 6-12 mo.  Marland Kitchen Restless legs syndrome    Pt can get to sleep fine so she declines med for this as of 08/2016    Past Surgical History:  Procedure Laterality Date  . BREAST SURGERY  1999   reduction  . ENDOMETRIAL ABLATION  2002  . FOOT FRACTURE SURGERY  03/2018   Closed displaced fracture of right navicular, nonunion.  Marland Kitchen KIDNEY STONE SURGERY  2005  . LAPAROSCOPIC VAGINAL HYSTERECTOMY  12/02/07   for DUB  . REDUCTION MAMMAPLASTY    . TRANSTHORACIC ECHOCARDIOGRAM  06/13/2018   Grd I DD    Family History  Problem Relation Age of Onset  . Cancer Mother        brain ca (glioblastoma)  . Breast cancer Paternal Aunt   . Breast cancer Maternal Grandmother      Current Outpatient Medications:  .  albuterol (VENTOLIN HFA) 108 (90 Base) MCG/ACT inhaler, Inhale 2 puffs into the lungs every 6 (six) hours as needed for wheezing or shortness of breath., Disp: 1 Inhaler, Rfl: 0 .  ALPRAZolam (XANAX) 1 MG tablet, TAKE ONE TABLET BY MOUTH TWICE DAILY AS NEEDED FOR ANXIETY, Disp: 180 tablet, Rfl: 1 .  Biotin 10000 MCG TABS, Take 1 tablet by mouth daily., Disp: , Rfl:  .  celecoxib (CELEBREX) 200 MG capsule, TAKE 1 CAPSULE  BY MOUTH TWICE A DAY AS NEEDED, Disp: 60 capsule, Rfl: 6 .  Cholecalciferol (VITAMIN D3) 5000 units CAPS, Take 1 capsule by mouth daily., Disp: , Rfl:  .  citalopram (CELEXA) 40 MG tablet, TAKE ONE (1) TABLET BY MOUTH EVERY DAY, Disp: 90 tablet, Rfl: 1 .  Cyanocobalamin (VITAMIN B-12 PO), Take 1 tablet by mouth daily. Reported on 04/07/2015, Disp: , Rfl:  .  eletriptan (RELPAX) 40 MG tablet, TAKE 1 TABLET BY MOUTH EVERY DAY AS NEEDED MAY REPEAT IN 2 HOURS IF NOT IMPROVED, Disp: 27 tablet, Rfl: 1 .  HYDROcodone-acetaminophen (NORCO/VICODIN) 5-325 MG tablet, 1 tab tid prn, Disp: 90 tablet, Rfl: 0 .  methocarbamol (ROBAXIN) 500 MG tablet, TAKE 1 TO 2 TABLETS BY MOUTH EVERY 6 HOURS AS NEEDED FOR MUSCULAR  RELAXATION, Disp: 30 tablet, Rfl: 5 .  pantoprazole (PROTONIX) 40 MG tablet, TAKE ONE (1) TABLET BY MOUTH EVERY DAY, Disp: 90 tablet, Rfl: 3 .  colchicine 0.6 MG tablet, 2 tabs po at onset of gout pain, then 1 tab po one hour later (Patient not taking: Reported on 04/22/2018), Disp: 15 tablet, Rfl: 2 .  lidocaine (LIDODERM) 5 %, lidocaine 5 % topical patch, Disp: , Rfl:  .  tretinoin (RETIN-A) 0.025 % cream, Apply 1 application topically at bedtime as needed., Disp: , Rfl: 99  EXAM:  VITALS per patient if applicable: Temp 34.9 F (37.2 C) (Oral)   Wt 163 lb (73.9 kg)   BMI 28.42 kg/m    GENERAL: alert, oriented, appears well and in no acute distress  HEENT: atraumatic, conjunttiva clear, no obvious abnormalities on inspection of external nose and ears  NECK: normal movements of the head and neck  LUNGS: on inspection no signs of respiratory distress, breathing rate appears normal, no obvious gross SOB, gasping or wheezing  CV: no obvious cyanosis  MS: moves all visible extremities without noticeable abnormality  PSYCH/NEURO: pleasant and cooperative, no obvious depression or anxiety, speech and thought processing grossly intact  ASSESSMENT AND PLAN:  Discussed the following assessment and plan:  TMJ syndrome/arthralgia, left.  Having some referred pain to L ear and to pharynx. Reassured.  No new meds indicated. I demonstrated the jaw toning exercises recommended to help decrease the excessive laxity of TMJ.   I discussed the assessment and treatment plan with the patient. The patient was provided an opportunity to ask questions and all were answered. The patient agreed with the plan and demonstrated an understanding of the instructions.   The patient was advised to call back or seek an in-person evaluation if the symptoms worsen or if the condition fails to improve as anticipated.  F/u: prn  Signed:  Crissie Sickles, MD           07/10/2018

## 2018-07-19 ENCOUNTER — Other Ambulatory Visit: Payer: Self-pay | Admitting: Family Medicine

## 2018-07-23 NOTE — Telephone Encounter (Signed)
I will authorize the celcoxib. However, due to controlled substance prescribing rules I need to see her every 3 months to address JUST the pain issue.  I will rx #15 of the hydrocodone tabs but needs o/v (virtual or in-office, either is fine) prior to any FURTHER hydrocodone rx's.-thx

## 2018-07-23 NOTE — Telephone Encounter (Signed)
RF request for celebrex and hydrocodone. Last OV was 07/10/2018- I believe this was acute only. Last OV about chronic pain was 04/22/4038 Last hydrocodone RX was 04/22/2018 # 90 no refills.  Last Celebrex RX was 09/03/2017 # 60 x 6 refills.   Please advise.

## 2018-07-23 NOTE — Telephone Encounter (Signed)
Detailed message left on patient's cell, okay per DPR.

## 2018-08-19 ENCOUNTER — Telehealth: Payer: Self-pay | Admitting: Family Medicine

## 2018-08-19 MED ORDER — HYDROCODONE-ACETAMINOPHEN 5-325 MG PO TABS: ORAL_TABLET | ORAL | 0 refills | Status: DC

## 2018-08-19 NOTE — Telephone Encounter (Signed)
Refill request HYDROcodone-acetaminophen (NORCO/VICODIN) 5-325 MG tablet [[id number]]   Jacqueline Vance  Does not have enough to last until appt on 8/17 - virtual appt  Also her husband has a CPE appt on 8/21 - [facility]. Per his insurance they both have to have a physical. [facility] does not have any openings for CPE until September. She is requesting to do husband and her physcial at the same time. I told her I would ask. I also told her to check with insurance to see if physical could be done at an urgent care.  Please advice and contact

## 2018-08-19 NOTE — Telephone Encounter (Signed)
I eRx'd #15 vicodin tabs. She is 1 mo overdue for the appropriate f/u visit for chronic pain. No further pain meds can be prescribed until she is seen for f/u of her chronic pain.-thx

## 2018-08-19 NOTE — Telephone Encounter (Signed)
RF request for Hydrocodone LOV: 07/10/18 Next ov: 09/02/18 Last written: 07/23/18 (15,0) Last CSC and UDS: 09/14/27  Please advise, thanks.

## 2018-08-20 NOTE — Telephone Encounter (Signed)
If I have a spot on the schedule for her CPE on 8/21 that is fine. Also fine if she would rather just make her 8/17 appt a CPE. I'm fine either way.

## 2018-08-20 NOTE — Telephone Encounter (Signed)
MyChart message sent regarding medication and appointment preference.

## 2018-08-20 NOTE — Telephone Encounter (Signed)
Her husband Christia [place] has a CPE with you on 8/21 and she wanted to know if she could do her physical that same day or can her follow up on 8/17 be changed to a CPE?  Please advise, thanks.

## 2018-08-22 NOTE — Telephone Encounter (Signed)
LM for pt to return call regarding possible appt change.

## 2018-08-23 NOTE — Telephone Encounter (Signed)
Patient sent mychart message stating that she would keep 8/17 as follow up appt and schedule physical separately.

## 2018-09-02 ENCOUNTER — Telehealth: Payer: Self-pay

## 2018-09-02 ENCOUNTER — Encounter: Payer: Self-pay | Admitting: Family Medicine

## 2018-09-02 ENCOUNTER — Other Ambulatory Visit: Payer: Self-pay

## 2018-09-02 ENCOUNTER — Ambulatory Visit (INDEPENDENT_AMBULATORY_CARE_PROVIDER_SITE_OTHER): Payer: 59 | Admitting: Family Medicine

## 2018-09-02 VITALS — Temp 98.7°F | Wt 159.0 lb

## 2018-09-02 DIAGNOSIS — M545 Low back pain, unspecified: Secondary | ICD-10-CM

## 2018-09-02 DIAGNOSIS — M797 Fibromyalgia: Secondary | ICD-10-CM

## 2018-09-02 DIAGNOSIS — G894 Chronic pain syndrome: Secondary | ICD-10-CM

## 2018-09-02 DIAGNOSIS — F988 Other specified behavioral and emotional disorders with onset usually occurring in childhood and adolescence: Secondary | ICD-10-CM

## 2018-09-02 DIAGNOSIS — G8929 Other chronic pain: Secondary | ICD-10-CM

## 2018-09-02 DIAGNOSIS — F411 Generalized anxiety disorder: Secondary | ICD-10-CM | POA: Diagnosis not present

## 2018-09-02 MED ORDER — HYDROCODONE-ACETAMINOPHEN 5-325 MG PO TABS: ORAL_TABLET | ORAL | 0 refills | Status: DC

## 2018-09-02 MED ORDER — TRETINOIN 0.025 % EX CREA: 1.0000 "application " | TOPICAL_CREAM | Freq: Every evening | CUTANEOUS | 3 refills | Status: DC | PRN

## 2018-09-02 NOTE — Telephone Encounter (Signed)
Tried calling patient 15 minutes before appointment and 5 minutes before appointment. Left voicemail the 1st attempt. I wasn't able to get through on the second attempt. Will try to call again.

## 2018-09-02 NOTE — Progress Notes (Signed)
Virtual Visit via Video Note  I connected with Jacqueline Vance on 09/02/18 at  3:30 PM EDT by a video enabled telemedicine application and verified that I am speaking with the correct person using two identifiers.  Location patient: home Location provider:work or home office Persons participating in the virtual visit: patient, provider  I discussed the limitations of evaluation and management by telemedicine and the availability of in person appointments. The patient expressed understanding and agreed to proceed.  Telemedicine visit is a necessity given the COVID-19 restrictions in place at the current time.  HPI: 58 y/o WF being seen today for 4 mo f/u chronic pain syndrome, adult ADD, and GAD.  Indication for chronic opioid:neck, mid back, low back, HA's, myofascial pain, myalgias of appendicular skeleton. Some days finds it hard to get out of bed and function.  She carries dx of DDD of C and L spine, significant scoliosis, myofascial pain syndrome with trigger points and recurrent trochanteric bursitis. Has seen a handful of specialists in the past (spine and scoliosis center in [place], [place] ortho, neurologist, in [place], also [facility] center in [place]). She has never been managed by a pain mgmt specialist. Medication and dose:vicodin 5/325, 1 tid prn # pills per month: 90 Last UDS date:09/13/17-->appropriate results. Opioid Treatment Agreement signed (Y/N):Y,09/13/17 Opioid Treatment Agreement last reviewed with patient:today [facility] reviewed this encounter (include red flags):today. Most recent fill of vicodin was 08/20/18, #15, rx by me. No suspicious activity.  Interim hx: Pain well controlled with current med regimen. Typically takes 3 tabs per day. NO side effects.  Adult ADD: she weened herself off vyvanse prior to last f/u visit and chose to stay of the med at that time.  Still doing fine off med.  GAD: taking citalopram 40mg  qd and alprazolam 1 mg bid. Most recent alpraz rx  filled 08/20/18, #62, RF x 1.  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  ROS: See pertinent positives and negatives per HPI.  Past Medical History:  Diagnosis Date  . Adult ADHD   . Anxiety   . Aortic atherosclerosis ([place]) 08/2016  . Chronic back pain    neck, too.  MRI L spine 05/2008: 40 degrees dextroscoliosis due to a hemivertebra of L3.  Some DDD/spondylosis w/out spinal stenosis.  . Depression   . Diastolic dysfunction, left ventricle 05/2018   Echo with grd I DD.  Marland Kitchen Gout    2 attacks total as of 12/2014.  Marland Kitchen Kidney stones 2005; 2017   Most recent CT 08/2016 showed <2 mm nonobstructing nephrolithiasis.  . Migraine    Proph tx with topamax helps, abortive tx with relpax helps.  Couldn't afford topamax so d/c'd this 04/2016.    . Navicular fracture, foot 03/2018   Right Navicular fx-nonunion (surgery)  . Pulmonary nodule 08/2016   6 mm LLL pulm nodule---repeat CT noncontrast recommended 6-12 mo.  Marland Kitchen Restless legs syndrome    Jacqueline Vance can get to sleep fine so she declines med for this as of 08/2016    Past Surgical History:  Procedure Laterality Date  . BREAST SURGERY  1999   reduction  . ENDOMETRIAL ABLATION  2002  . FOOT FRACTURE SURGERY  03/2018   Closed displaced fracture of right navicular, nonunion.  Marland Kitchen KIDNEY STONE SURGERY  2005  . LAPAROSCOPIC VAGINAL HYSTERECTOMY  12/02/07   for DUB  . REDUCTION MAMMAPLASTY    . TRANSTHORACIC ECHOCARDIOGRAM  06/13/2018   Grd  I DD    Family History  Problem Relation Age of Onset  . Cancer Mother        brain ca (glioblastoma)  . Breast cancer Paternal Aunt   . Breast cancer Maternal Grandmother      Current Outpatient Medications:  .  ALPRAZolam (XANAX) 1 MG tablet, TAKE ONE TABLET BY MOUTH TWICE DAILY AS NEEDED FOR ANXIETY, Disp: 180 tablet, Rfl: 1 .  Biotin 10000 MCG TABS, Take 1 tablet by mouth daily., Disp: , Rfl:  .  celecoxib (CELEBREX) 200 MG capsule, TAKE ONE (1) CAPSULE BY MOUTH  TWICE A DAY. (EVERY 12 HOURS.) AS NEEDED, Disp: 60 capsule, Rfl: 6 .  Cholecalciferol (VITAMIN D3) 5000 units CAPS, Take 1 capsule by mouth daily., Disp: , Rfl:  .  citalopram (CELEXA) 40 MG tablet, TAKE ONE (1) TABLET BY MOUTH EVERY DAY, Disp: 90 tablet, Rfl: 1 .  Cyanocobalamin (VITAMIN B-12 PO), Take 1 tablet by mouth daily. Reported on 04/07/2015, Disp: , Rfl:  .  eletriptan (RELPAX) 40 MG tablet, TAKE 1 TABLET BY MOUTH EVERY DAY AS NEEDED MAY REPEAT IN 2 HOURS IF NOT IMPROVED, Disp: 27 tablet, Rfl: 1 .  HYDROcodone-acetaminophen (NORCO/VICODIN) 5-325 MG tablet, TAKE ONE TABLET BY MOUTH THREE TIMES DAILY AS NEEDED, Disp: 15 tablet, Rfl: 0 .  methocarbamol (ROBAXIN) 500 MG tablet, TAKE 1 TO 2 TABLETS BY MOUTH EVERY 6 HOURS AS NEEDED FOR MUSCULAR RELAXATION, Disp: 30 tablet, Rfl: 5 .  pantoprazole (PROTONIX) 40 MG tablet, TAKE ONE (1) TABLET BY MOUTH EVERY DAY, Disp: 90 tablet, Rfl: 3 .  tretinoin (RETIN-A) 0.025 % cream, Apply 1 application topically at bedtime as needed., Disp: , Rfl: 99 .  albuterol (VENTOLIN HFA) 108 (90 Base) MCG/ACT inhaler, Inhale 2 puffs into the lungs every 6 (six) hours as needed for wheezing or shortness of breath. (Patient not taking: Reported on 09/02/2018), Disp: 1 Inhaler, Rfl: 0 .  colchicine 0.6 MG tablet, 2 tabs po at onset of gout pain, then 1 tab po one hour later (Patient not taking: Reported on 04/22/2018), Disp: 15 tablet, Rfl: 2 .  lidocaine (LIDODERM) 5 %, lidocaine 5 % topical patch, Disp: , Rfl:   EXAM:  VITALS per patient if applicable:  GENERAL: alert, oriented, appears well and in no acute distress  HEENT: atraumatic, conjunttiva clear, no obvious abnormalities on inspection of external nose and ears  NECK: normal movements of the head and neck  LUNGS: on inspection no signs of respiratory distress, breathing rate appears normal, no obvious gross SOB, gasping or wheezing  CV: no obvious cyanosis  MS: moves all visible extremities without  noticeable abnormality  PSYCH/NEURO: pleasant and cooperative, no obvious depression or anxiety, speech and thought processing grossly intact  LABS: none today    Chemistry      Component Value Date/Time   NA 141 09/13/2017 0922   K 4.2 09/13/2017 0922   CL 105 09/13/2017 0922   CO2 31 09/13/2017 0922   BUN 8 09/13/2017 0922   CREATININE 0.84 09/13/2017 0922      Component Value Date/Time   CALCIUM 9.6 09/13/2017 0922   ALKPHOS 100 09/13/2017 0922   AST 21 09/13/2017 0922   ALT 20 09/13/2017 0922   BILITOT 0.5 09/13/2017 0922     Lab Results  Component Value Date   WBC 5.1 09/13/2017   HGB 14.1 09/13/2017   HCT 42.8 09/13/2017   MCV 87.0 09/13/2017   PLT 232.0 09/13/2017    ASSESSMENT AND PLAN:  Discussed the following assessment and plan:  1) Chronic pain syndrome: DDD/back pain, myofascial pain. The current medical regimen is effective;  continue present plan and medications. I did electronic rx's for vicodin 5/325, 1 tid prn, #90 today for this month, Sept, and Oct 2020.  Appropriate fill on/after date was noted on each rx.  2) GAD: The current medical regimen is effective;  continue present plan and medications. No new rxs needed today.  3) Adult ADD: doing fine OFF meds the last 5-6 mo.  I discussed the assessment and treatment plan with the patient. The patient was provided an opportunity to ask questions and all were answered. The patient agreed with the plan and demonstrated an understanding of the instructions.   The patient was advised to call back or seek an in-person evaluation if the symptoms worsen or if the condition fails to improve as anticipated.  F/u: 3 mo CPE and renewal of CSC and repeat UDS.  Signed:  Crissie Sickles, MD           09/02/2018

## 2018-09-06 ENCOUNTER — Other Ambulatory Visit: Payer: Self-pay | Admitting: Family Medicine

## 2018-09-06 MED ORDER — TRETINOIN 0.025 % EX CREA: 1.0000 "application " | TOPICAL_CREAM | Freq: Every evening | CUTANEOUS | 3 refills | Status: DC | PRN

## 2018-09-10 ENCOUNTER — Telehealth: Payer: Self-pay

## 2018-09-10 NOTE — Telephone Encounter (Signed)
Started and sent PA for Tretinoin 0.025% cream to [facility] through [employer].   KeyMaurene Capes  [id number]   Will check status in 24-48 hours.

## 2018-09-10 NOTE — Telephone Encounter (Signed)
Called patient and told her PA for Tretinoin was approved. She stated that she is having issues getting her Eletriptan and her pharm stated that she will need a PA. Advised patient that I will start on that today.    Started and sent a PA through [employer].   Key: [id number] PA Case ID: [id number]  BINUF:[id number] PCN: [id number] Group: [id number]  Medication: Eletriptan 40mg  tab   Will check the status in 24-48 hours.

## 2018-09-11 NOTE — Telephone Encounter (Signed)
Patients PA for Eletriptan was denied. Called patient and she is aware. Patient is only getting 4 pills a month. She is going to call her insurance and see what medication in that class is covered.

## 2018-09-20 ENCOUNTER — Other Ambulatory Visit: Payer: Self-pay | Admitting: Family Medicine

## 2018-09-25 ENCOUNTER — Encounter: Payer: 59 | Admitting: Family Medicine

## 2018-10-04 ENCOUNTER — Other Ambulatory Visit: Payer: Self-pay | Admitting: Family Medicine

## 2018-10-04 ENCOUNTER — Encounter: Payer: Self-pay | Admitting: Family Medicine

## 2018-10-04 ENCOUNTER — Other Ambulatory Visit: Payer: Self-pay | Admitting: Internal Medicine

## 2018-10-04 ENCOUNTER — Other Ambulatory Visit (INDEPENDENT_AMBULATORY_CARE_PROVIDER_SITE_OTHER): Payer: 59

## 2018-10-04 ENCOUNTER — Other Ambulatory Visit: Payer: Self-pay

## 2018-10-04 ENCOUNTER — Ambulatory Visit (INDEPENDENT_AMBULATORY_CARE_PROVIDER_SITE_OTHER): Payer: 59 | Admitting: Family Medicine

## 2018-10-04 ENCOUNTER — Telehealth: Payer: Self-pay

## 2018-10-04 VITALS — BP 132/92 | HR 63 | Temp 97.8°F | Resp 16 | Ht 63.25 in | Wt 162.6 lb

## 2018-10-04 DIAGNOSIS — Z0000 Encounter for general adult medical examination without abnormal findings: Secondary | ICD-10-CM

## 2018-10-04 DIAGNOSIS — Z79899 Other long term (current) drug therapy: Secondary | ICD-10-CM | POA: Diagnosis not present

## 2018-10-04 DIAGNOSIS — Z23 Encounter for immunization: Secondary | ICD-10-CM | POA: Diagnosis not present

## 2018-10-04 DIAGNOSIS — I5189 Other ill-defined heart diseases: Secondary | ICD-10-CM | POA: Diagnosis not present

## 2018-10-04 DIAGNOSIS — Z1231 Encounter for screening mammogram for malignant neoplasm of breast: Secondary | ICD-10-CM

## 2018-10-04 DIAGNOSIS — R911 Solitary pulmonary nodule: Secondary | ICD-10-CM

## 2018-10-04 LAB — LIPID PANEL
Cholesterol: 192 mg/dL (ref 0–200)
HDL: 41 mg/dL (ref >39.00)
LDL Cholesterol: 126 mg/dL — ABNORMAL HIGH (ref 0–99)
NonHDL: 151.01
Total CHOL/HDL Ratio: 5
Triglycerides: 127 mg/dL (ref 0.0–149.0)
VLDL: 25.4 mg/dL (ref 0.0–40.0)

## 2018-10-04 LAB — CBC WITH DIFFERENTIAL/PLATELET
Basophils Absolute: 0 10*3/uL (ref 0.0–0.1)
Basophils Relative: 0.7 % (ref 0.0–3.0)
Eosinophils Absolute: 0.1 10*3/uL (ref 0.0–0.7)
Eosinophils Relative: 1.2 % (ref 0.0–5.0)
HCT: 40.4 % (ref 36.0–46.0)
Hemoglobin: 13.7 g/dL (ref 12.0–15.0)
Lymphocytes Relative: 47 % — ABNORMAL HIGH (ref 12.0–46.0)
Lymphs Abs: 2 10*3/uL (ref 0.7–4.0)
MCHC: 33.8 g/dL (ref 30.0–36.0)
MCV: 86 fl (ref 78.0–100.0)
Monocytes Absolute: 0.3 10*3/uL (ref 0.1–1.0)
Monocytes Relative: 8.1 % (ref 3.0–12.0)
Neutro Abs: 1.8 10*3/uL (ref 1.4–7.7)
Neutrophils Relative %: 43 % (ref 43.0–77.0)
Platelets: 188 10*3/uL (ref 150.0–400.0)
RBC: 4.69 Mil/uL (ref 3.87–5.11)
RDW: 13.5 % (ref 11.5–15.5)
WBC: 4.1 10*3/uL (ref 4.0–10.5)

## 2018-10-04 LAB — COMPREHENSIVE METABOLIC PANEL
ALT: 9 U/L (ref 0–35)
AST: 16 U/L (ref 0–37)
Albumin: 4.4 g/dL (ref 3.5–5.2)
Alkaline Phosphatase: 85 U/L (ref 39–117)
BUN: 9 mg/dL (ref 6–23)
CO2: 26 mEq/L (ref 19–32)
Calcium: 9.8 mg/dL (ref 8.4–10.5)
Chloride: 107 mEq/L (ref 96–112)
Creatinine, Ser: 0.75 mg/dL (ref 0.40–1.20)
GFR: 79.86 mL/min (ref >60.00)
Glucose, Bld: 83 mg/dL (ref 70–99)
Potassium: 4.2 mEq/L (ref 3.5–5.1)
Sodium: 141 mEq/L (ref 135–145)
Total Bilirubin: 0.6 mg/dL (ref 0.2–1.2)
Total Protein: 6.7 g/dL (ref 6.0–8.3)

## 2018-10-04 LAB — TSH: TSH: 0.61 u[IU]/mL (ref 0.35–4.50)

## 2018-10-04 NOTE — Patient Instructions (Signed)
Health Maintenance, Female Adopting a healthy lifestyle and getting preventive care are important in promoting health and wellness. Ask your health care provider about:  The right schedule for you to have regular tests and exams.  Things you can do on your own to prevent diseases and keep yourself healthy. What should I know about diet, weight, and exercise? Eat a healthy diet   Eat a diet that includes plenty of vegetables, fruits, low-fat dairy products, and lean protein.  Do not eat a lot of foods that are high in solid fats, added sugars, or sodium. Maintain a healthy weight Body mass index (BMI) is used to identify weight problems. It estimates body fat based on height and weight. Your health care provider can help determine your BMI and help you achieve or maintain a healthy weight. Get regular exercise Get regular exercise. This is one of the most important things you can do for your health. Most adults should:  Exercise for at least 150 minutes each week. The exercise should increase your heart rate and make you sweat (moderate-intensity exercise).  Do strengthening exercises at least twice a week. This is in addition to the moderate-intensity exercise.  Spend less time sitting. Even light physical activity can be beneficial. Watch cholesterol and blood lipids Have your blood tested for lipids and cholesterol at 56 years of age, then have this test every 5 years. Have your cholesterol levels checked more often if:  Your lipid or cholesterol levels are high.  You are older than 56 years of age.  You are at high risk for heart disease. What should I know about cancer screening? Depending on your health history and family history, you may need to have cancer screening at various ages. This may include screening for:  Breast cancer.  Cervical cancer.  Colorectal cancer.  Skin cancer.  Lung cancer. What should I know about heart disease, diabetes, and high blood  pressure? Blood pressure and heart disease  High blood pressure causes heart disease and increases the risk of stroke. This is more likely to develop in people who have high blood pressure readings, are of African descent, or are overweight.  Have your blood pressure checked: ? Every 3-5 years if you are 18-39 years of age. ? Every year if you are 40 years old or older. Diabetes Have regular diabetes screenings. This checks your fasting blood sugar level. Have the screening done:  Once every three years after age 40 if you are at a normal weight and have a low risk for diabetes.  More often and at a younger age if you are overweight or have a high risk for diabetes. What should I know about preventing infection? Hepatitis B If you have a higher risk for hepatitis B, you should be screened for this virus. Talk with your health care provider to find out if you are at risk for hepatitis B infection. Hepatitis C Testing is recommended for:  Everyone born from 1945 through 1965.  Anyone with known risk factors for hepatitis C. Sexually transmitted infections (STIs)  Get screened for STIs, including gonorrhea and chlamydia, if: ? You are sexually active and are younger than 56 years of age. ? You are older than 56 years of age and your health care provider tells you that you are at risk for this type of infection. ? Your sexual activity has changed since you were last screened, and you are at increased risk for chlamydia or gonorrhea. Ask your health care provider if   you are at risk.  Ask your health care provider about whether you are at high risk for HIV. Your health care provider may recommend a prescription medicine to help prevent HIV infection. If you choose to take medicine to prevent HIV, you should first get tested for HIV. You should then be tested every 3 months for as long as you are taking the medicine. Pregnancy  If you are about to stop having your period (premenopausal) and  you may become pregnant, seek counseling before you get pregnant.  Take 400 to 800 micrograms (mcg) of folic acid every day if you become pregnant.  Ask for birth control (contraception) if you want to prevent pregnancy. Osteoporosis and menopause Osteoporosis is a disease in which the bones lose minerals and strength with aging. This can result in bone fractures. If you are 65 years old or older, or if you are at risk for osteoporosis and fractures, ask your health care provider if you should:  Be screened for bone loss.  Take a calcium or vitamin D supplement to lower your risk of fractures.  Be given hormone replacement therapy (HRT) to treat symptoms of menopause. Follow these instructions at home: Lifestyle  Do not use any products that contain nicotine or tobacco, such as cigarettes, e-cigarettes, and chewing tobacco. If you need help quitting, ask your health care provider.  Do not use street drugs.  Do not share needles.  Ask your health care provider for help if you need support or information about quitting drugs. Alcohol use  Do not drink alcohol if: ? Your health care provider tells you not to drink. ? You are pregnant, may be pregnant, or are planning to become pregnant.  If you drink alcohol: ? Limit how much you use to 0-1 drink a day. ? Limit intake if you are breastfeeding.  Be aware of how much alcohol is in your drink. In the [place] one drink equals one 12 oz bottle of beer (355 mL), one 5 oz glass of wine (148 mL), or one 1 oz glass of hard liquor (44 mL). General instructions  Schedule regular health, dental, and eye exams.  Stay current with your vaccines.  Tell your health care provider if: ? You often feel depressed. ? You have ever been abused or do not feel safe at home. Summary  Adopting a healthy lifestyle and getting preventive care are important in promoting health and wellness.  Follow your health care provider's instructions about healthy  diet, exercising, and getting tested or screened for diseases.  Follow your health care provider's instructions on monitoring your cholesterol and blood pressure. This information is not intended to replace advice given to you by your health care provider. Make sure you discuss any questions you have with your health care provider. Document Released: 07/18/2010 Document Revised: 12/26/2017 Document Reviewed: 12/26/2017 Elsevier Patient Education  2020 [employer]  

## 2018-10-04 NOTE — Telephone Encounter (Signed)
Patient had CPE appt today and brought health form for completion. Patient requested that it be mailed back once completed.

## 2018-10-04 NOTE — Progress Notes (Signed)
Office Note 10/04/2018  CC:  Chief Complaint  Patient presents with  . Annual Exam    pt is fasting    HPI:  Jacqueline Vance is a 58 y.o. White female who is here for annual health maintenance exam.   Past Medical History:  Diagnosis Date  . Adult ADHD   . Anxiety   . Aortic atherosclerosis ([place]) 08/2016  . Chronic back pain    neck, too.  MRI L spine 05/2008: 40 degrees dextroscoliosis due to a hemivertebra of L3.  Some DDD/spondylosis w/out spinal stenosis.  . Depression   . Diastolic dysfunction, left ventricle 05/2018   Echo with grd I DD.  Marland Kitchen Gout    2 attacks total as of 12/2014.  Marland Kitchen Kidney stones 2005; 2017   Most recent CT 08/2016 showed <2 mm nonobstructing nephrolithiasis.  . Migraine    Proph tx with topamax helps, abortive tx with relpax helps.  Couldn't afford topamax so d/c'd this 04/2016.    . Navicular fracture, foot 03/2018   Right Navicular fx-nonunion (surgery)  . Pulmonary nodule 08/2016   6 mm LLL pulm nodule---repeat CT noncontrast recommended 6-12 mo.  Marland Kitchen Restless legs syndrome    Pt can get to sleep fine so she declines med for this as of 08/2016    Past Surgical History:  Procedure Laterality Date  . BREAST SURGERY  1999   reduction  . ENDOMETRIAL ABLATION  2002  . FOOT FRACTURE SURGERY  03/2018   Closed displaced fracture of right navicular, nonunion.  Marland Kitchen KIDNEY STONE SURGERY  2005  . LAPAROSCOPIC VAGINAL HYSTERECTOMY  12/02/07   for DUB  . REDUCTION MAMMAPLASTY    . TRANSTHORACIC ECHOCARDIOGRAM  06/13/2018   Grd I DD    Family History  Problem Relation Age of Onset  . Cancer Mother        brain ca (glioblastoma)  . Breast cancer Paternal Aunt   . Breast cancer Maternal Grandmother     Social History   Socioeconomic History  . Marital status: Married    Spouse name: Not on file  . Number of children: Not on file  . Years of education: Not on file  . Highest education level: Not on file  Occupational History  . Not on file   Social Needs  . Financial resource strain: Not on file  . Food insecurity    Worry: Not on file    Inability: Not on file  . Transportation needs    Medical: Not on file    Non-medical: Not on file  Tobacco Use  . Smoking status: Never Smoker  . Smokeless tobacco: Never Used  Substance and Sexual Activity  . Alcohol use: No  . Drug use: No  . Sexual activity: Not on file  Lifestyle  . Physical activity    Days per week: Not on file    Minutes per session: Not on file  . Stress: Not on file  Relationships  . Social Herbalist on phone: Not on file    Gets together: Not on file    Attends religious service: Not on file    Active member of club or organization: Not on file    Attends meetings of clubs or organizations: Not on file    Relationship status: Not on file  . Intimate partner violence    Fear of current or ex partner: Not on file    Emotionally abused: Not on file    Physically abused: Not  on file    Forced sexual activity: Not on file  Other Topics Concern  . Not on file  Social History Narrative   Married, one adult son.  Lives in [place], [place].   Works for [employer] in [place].    Exercise: walks 3 miles per day.   No T/A/Ds.                Outpatient Medications Prior to Visit  Medication Sig Dispense Refill  . ALPRAZolam (XANAX) 1 MG tablet TAKE ONE TABLET BY MOUTH TWICE DAILY AS NEEDED FOR ANXIETY 180 tablet 1  . Biotin 10000 MCG TABS Take 1 tablet by mouth daily.    . celecoxib (CELEBREX) 200 MG capsule TAKE ONE (1) CAPSULE BY MOUTH TWICE A DAY. (EVERY 12 HOURS.) AS NEEDED 60 capsule 6  . Cholecalciferol (VITAMIN D3) 5000 units CAPS Take 1 capsule by mouth daily.    . citalopram (CELEXA) 40 MG tablet TAKE ONE (1) TABLET BY MOUTH EVERY DAY 90 tablet 1  . Cyanocobalamin (VITAMIN B-12 PO) Take 1 tablet by mouth daily. Reported on 04/07/2015    . eletriptan (RELPAX) 40 MG tablet TAKE 1 TABLET BY MOUTH EVERY DAY AS  NEEDED MAY REPEAT IN 2 HOURS IF NOT IMPROVED 27 tablet 1  . HYDROcodone-acetaminophen (NORCO/VICODIN) 5-325 MG tablet TAKE ONE TABLET BY MOUTH THREE TIMES DAILY AS NEEDED 90 tablet 0  . lidocaine (LIDODERM) 5 % lidocaine 5 % topical patch    . methocarbamol (ROBAXIN) 500 MG tablet TAKE 1 TO 2 TABLETS BY MOUTH EVERY 6 HOURS AS NEEDED FOR MUSCULAR RELAXATION 30 tablet 5  . pantoprazole (PROTONIX) 40 MG tablet TAKE ONE (1) TABLET BY MOUTH EVERY DAY 90 tablet 3  . tretinoin (RETIN-A) 0.025 % cream Apply 1 application topically at bedtime as needed. For acne prevention/control 45 g 3  . albuterol (VENTOLIN HFA) 108 (90 Base) MCG/ACT inhaler Inhale 2 puffs into the lungs every 6 (six) hours as needed for wheezing or shortness of breath. (Patient not taking: Reported on 09/02/2018) 1 Inhaler 0  . colchicine 0.6 MG tablet 2 tabs po at onset of gout pain, then 1 tab po one hour later (Patient not taking: Reported on 04/22/2018) 15 tablet 2   No facility-administered medications prior to visit.     Allergies  Allergen Reactions  . Imitrex [Sumatriptan] Other (See Comments)    Face and Arm numbness    ROS Review of Systems  Constitutional: Negative for appetite change, chills, fatigue and fever.  HENT: Negative for congestion, dental problem, ear pain and sore throat.   Eyes: Negative for discharge, redness and visual disturbance.  Respiratory: Negative for cough, chest tightness, shortness of breath and wheezing.   Cardiovascular: Negative for chest pain, palpitations and leg swelling.  Gastrointestinal: Negative for abdominal pain, blood in stool, diarrhea, nausea and vomiting.  Genitourinary: Negative for difficulty urinating, dysuria, flank pain, frequency, hematuria and urgency.  Musculoskeletal: Negative for arthralgias, back pain, joint swelling, myalgias and neck stiffness.  Skin: Negative for pallor and rash.  Neurological: Negative for dizziness, speech difficulty, weakness and headaches.   Hematological: Negative for adenopathy. Does not bruise/bleed easily.  Psychiatric/Behavioral: Negative for confusion and sleep disturbance. The patient is not nervous/anxious.     PE; Blood pressure (!) 132/92, pulse 63, temperature 97.8 F (36.6 C), temperature source Temporal, resp. rate 16, height 5' 3.25" (1.607 m), weight 162 lb 9.6 oz (73.8 kg), SpO2 96 %. Gen: Alert, well appearing.  Patient is oriented  to person, place, time, and situation. AFFECT: pleasant, lucid thought and speech. ENT: Ears: EACs clear, normal epithelium.  TMs with good light reflex and landmarks bilaterally.  Eyes: no injection, icteris, swelling, or exudate.  EOMI, PERRLA. Nose: no drainage or turbinate edema/swelling.  No injection or focal lesion.  Mouth: lips without lesion/swelling.  Oral mucosa pink and moist.  Dentition intact and without obvious caries or gingival swelling.  Oropharynx without erythema, exudate, or swelling.  Neck: supple/nontender.  No LAD, mass, or TM.  Carotid pulses 2+ bilaterally, without bruits. CV: RRR, no m/r/g.   LUNGS: CTA bilat, nonlabored resps, good aeration in all lung fields. ABD: soft, NT, ND, BS normal.  No hepatospenomegaly or mass.  No bruits. EXT: no clubbing, cyanosis, or edema.  Musculoskeletal: no joint swelling, erythema, warmth, or tenderness.  ROM of all joints intact. Skin - no sores or suspicious lesions or rashes or color changes   Pertinent labs:  Lab Results  Component Value Date   TSH 0.96 09/13/2017   Lab Results  Component Value Date   WBC 5.1 09/13/2017   HGB 14.1 09/13/2017   HCT 42.8 09/13/2017   MCV 87.0 09/13/2017   PLT 232.0 09/13/2017   Lab Results  Component Value Date   CREATININE 0.84 09/13/2017   BUN 8 09/13/2017   NA 141 09/13/2017   K 4.2 09/13/2017   CL 105 09/13/2017   CO2 31 09/13/2017   Lab Results  Component Value Date   ALT 20 09/13/2017   AST 21 09/13/2017   ALKPHOS 100 09/13/2017   BILITOT 0.5 09/13/2017    Lab Results  Component Value Date   CHOL 196 09/13/2017   Lab Results  Component Value Date   HDL 52.60 09/13/2017   Lab Results  Component Value Date   LDLCALC 118 (H) 09/13/2017   Lab Results  Component Value Date   TRIG 123.0 09/13/2017   Lab Results  Component Value Date   CHOLHDL 4 09/13/2017   No results found for: HGBA1C  ASSESSMENT AND PLAN:   Health maintenance exam: Reviewed age and gender appropriate health maintenance issues (prudent diet, regular exercise, health risks of tobacco and excessive alcohol, use of seatbelts, fire alarms in home, use of sunscreen).  Also reviewed age and gender appropriate health screening as well as vaccine recommendations. Vaccines: flu vaccine->given today.  Shingrix->check with [facility] in [place] about this. Labs: fasting HP.  Also UDS (high risk med use--see med list). Colon ca screening: she opts for cologuard. Breast ca screening: mammogram ordered/scheduled for 11/19/2018. Cerv ca screening: hx of hysterectomy for benign dx->no further paps indicated.  Hx of pulm nodule 2018, LLL (6 mm) : due for f/u noncontrast CT to monitor this-->ordered today.  High risk med use (see med list): we updated her CSC and obtained UDS today.  An After Visit Summary was printed and given to the patient.  FOLLOW UP:  Return in about 3 months (around 01/03/2019) for routine chronic illness f/u.  Signed:  Crissie Sickles, MD           10/04/2018

## 2018-10-06 LAB — PAIN MGMT, PROFILE 8 W/CONF, U
6 Acetylmorphine: NEGATIVE ng/mL
Alcohol Metabolites: NEGATIVE ng/mL (ref <500)
Alphahydroxyalprazolam: 128 ng/mL
Alphahydroxymidazolam: NEGATIVE ng/mL
Alphahydroxytriazolam: NEGATIVE ng/mL
Aminoclonazepam: NEGATIVE ng/mL
Amphetamines: NEGATIVE ng/mL
Benzodiazepines: POSITIVE ng/mL
Buprenorphine, Urine: NEGATIVE ng/mL
Cocaine Metabolite: NEGATIVE ng/mL
Codeine: NEGATIVE ng/mL
Creatinine: 141.6 mg/dL
Hydrocodone: 365 ng/mL
Hydromorphone: 78 ng/mL
Hydroxyethylflurazepam: NEGATIVE ng/mL
Lorazepam: NEGATIVE ng/mL
MDMA: NEGATIVE ng/mL
Marijuana Metabolite: NEGATIVE ng/mL
Morphine: NEGATIVE ng/mL
Nordiazepam: NEGATIVE ng/mL
Norhydrocodone: 1304 ng/mL
Opiates: POSITIVE ng/mL
Oxazepam: NEGATIVE ng/mL
Oxidant: NEGATIVE ug/mL
Oxycodone: NEGATIVE ng/mL
Temazepam: NEGATIVE ng/mL
pH: 7 (ref 4.5–9.0)

## 2018-10-08 NOTE — Telephone Encounter (Signed)
Labs results are back, form completed. Pt notified, copy of form made for pt's chart, original placed up front to be mailed.

## 2018-10-18 ENCOUNTER — Ambulatory Visit (HOSPITAL_COMMUNITY): Payer: 59

## 2018-10-18 ENCOUNTER — Other Ambulatory Visit: Payer: Self-pay | Admitting: Family Medicine

## 2018-10-23 ENCOUNTER — Telehealth: Payer: Self-pay

## 2018-10-23 NOTE — Telephone Encounter (Signed)
Paperwork was received for disability determination for social security and medicaid. LM for pt to return call to further advise.

## 2018-10-25 ENCOUNTER — Ambulatory Visit (HOSPITAL_COMMUNITY): Payer: 59

## 2018-11-01 ENCOUNTER — Ambulatory Visit (HOSPITAL_COMMUNITY): Admission: RE | Admit: 2018-11-01 | Payer: 59 | Source: Ambulatory Visit

## 2018-11-08 ENCOUNTER — Other Ambulatory Visit: Payer: Self-pay | Admitting: Family Medicine

## 2018-11-08 NOTE — Telephone Encounter (Signed)
RF request for Xanax LOV: 10/04/2018 CPE  Next ov: 12/27/2018 Last written: 04/22/2018 #180 x1 refill  Last Contract signed and UDS 10/04/2018  Please advise

## 2018-11-19 ENCOUNTER — Ambulatory Visit: Payer: 59

## 2018-12-02 ENCOUNTER — Other Ambulatory Visit: Payer: Self-pay | Admitting: Family Medicine

## 2018-12-09 ENCOUNTER — Other Ambulatory Visit: Payer: Self-pay | Admitting: Family Medicine

## 2018-12-09 NOTE — Telephone Encounter (Signed)
Requesting: Hydro/APAP Contract:10/04/18 UDS:10/04/18 Last Visit: 10/04/18 Next Visit:12/27/18 Last Refill:09/02/18 (90,0)  Please Advise. Medication pending

## 2018-12-27 ENCOUNTER — Encounter: Payer: Self-pay | Admitting: Family Medicine

## 2018-12-27 ENCOUNTER — Ambulatory Visit (INDEPENDENT_AMBULATORY_CARE_PROVIDER_SITE_OTHER): Payer: 59 | Admitting: Family Medicine

## 2018-12-27 ENCOUNTER — Other Ambulatory Visit: Payer: Self-pay

## 2018-12-27 ENCOUNTER — Ambulatory Visit: Payer: 59 | Admitting: Family Medicine

## 2018-12-27 VITALS — Temp 98.6°F | Wt 154.0 lb

## 2018-12-27 DIAGNOSIS — M4125 Other idiopathic scoliosis, thoracolumbar region: Secondary | ICD-10-CM | POA: Diagnosis not present

## 2018-12-27 DIAGNOSIS — G894 Chronic pain syndrome: Secondary | ICD-10-CM | POA: Diagnosis not present

## 2018-12-27 DIAGNOSIS — Z79899 Other long term (current) drug therapy: Secondary | ICD-10-CM

## 2018-12-27 DIAGNOSIS — M7918 Myalgia, other site: Secondary | ICD-10-CM | POA: Diagnosis not present

## 2018-12-27 DIAGNOSIS — R062 Wheezing: Secondary | ICD-10-CM

## 2018-12-27 DIAGNOSIS — R059 Cough, unspecified: Secondary | ICD-10-CM

## 2018-12-27 DIAGNOSIS — R05 Cough: Secondary | ICD-10-CM

## 2018-12-27 MED ORDER — HYDROCODONE-ACETAMINOPHEN 5-325 MG PO TABS: 1.0000 | ORAL_TABLET | Freq: Three times a day (TID) | ORAL | 0 refills | Status: DC | PRN

## 2018-12-27 MED ORDER — LIDOCAINE 5 % EX PTCH: MEDICATED_PATCH | CUTANEOUS | 11 refills | Status: DC

## 2018-12-27 MED ORDER — ALBUTEROL SULFATE HFA 108 (90 BASE) MCG/ACT IN AERS: 2.0000 | INHALATION_SPRAY | RESPIRATORY_TRACT | 0 refills | Status: DC | PRN

## 2018-12-27 NOTE — Progress Notes (Deleted)
OFFICE VISIT  12/27/2018   CC: No chief complaint on file.    HPI:    Patient is a 58 y.o. Caucasian female who presents for f/u chronic pain syndrome, adult ADD, and GAD.  Indication for chronic opioid:neck, mid back, low back, HA's, myofascial pain, myalgias of appendicular skeleton. Some days finds it hard to get out of bed and function.  She carries dx of DDD of C and L spine, significant scoliosis, myofascial pain syndrome with trigger points and recurrent trochanteric bursitis. Has seen a handful of specialists in the past (spine and scoliosis center in [place], [facility], neurologist, in [place], also HA and wellness center in [place]). She has never been managed by a pain mgmt specialist. Medication and dose:vicodin 5/325, 1 tid prn # pills per month: 90 Last UDS date:10/04/18-->appropriate results. Opioid Treatment Agreement signed (Y/N):Y,10/04/18. Opioid Treatment Agreement last reviewed with patient:today NCCSRS reviewed this encounter (include red flags):today. Most recent fill of vicodin was ***, # , rx by me. No suspicious activity.  Has GAD, treated long term with daily citalopram and alprazolam 1mg  bid prn. Reviewed PMP AWARE today: most recent rx for alpraz filled ***, #, rx by me. No red flags.  Pain:  Adult ADD:  GAD:    Past Medical History:  Diagnosis Date  . Adult ADHD   . Anxiety   . Aortic atherosclerosis ([place]) 08/2016  . Chronic back pain    neck, too.  MRI L spine 05/2008: 40 degrees dextroscoliosis due to a hemivertebra of L3.  Some DDD/spondylosis w/out spinal stenosis.  . Depression   . Diastolic dysfunction, left ventricle 05/2018   Echo with grd I DD.  Marland Kitchen Gout    2 attacks total as of 12/2014.  Marland Kitchen Kidney stones 2005; 2017   Most recent CT 08/2016 showed <2 mm nonobstructing nephrolithiasis.  . Migraine    Proph tx with topamax helps, abortive tx with relpax helps.  Couldn't afford topamax so d/c'd this 04/2016.    . Navicular fracture,  foot 03/2018   Right Navicular fx-nonunion (surgery)  . Pulmonary nodule 08/2016   6 mm LLL pulm nodule---repeat CT noncontrast recommended 6-12 mo.  Marland Kitchen Restless legs syndrome    Pt can get to sleep fine so she declines med for this as of 08/2016    Past Surgical History:  Procedure Laterality Date  . BREAST SURGERY  1999   reduction  . ENDOMETRIAL ABLATION  2002  . FOOT FRACTURE SURGERY  03/2018   Closed displaced fracture of right navicular, nonunion.  Marland Kitchen KIDNEY STONE SURGERY  2005  . LAPAROSCOPIC VAGINAL HYSTERECTOMY  12/02/07   for DUB  . REDUCTION MAMMAPLASTY    . TRANSTHORACIC ECHOCARDIOGRAM  06/13/2018   Grd I DD    Outpatient Medications Prior to Visit  Medication Sig Dispense Refill  . albuterol (VENTOLIN HFA) 108 (90 Base) MCG/ACT inhaler Inhale 2 puffs into the lungs every 6 (six) hours as needed for wheezing or shortness of breath. (Patient not taking: Reported on 09/02/2018) 1 Inhaler 0  . ALPRAZolam (XANAX) 1 MG tablet TAKE ONE TABLET BY MOUTH TWICE A DAY AS NEEDED FOR ANXIETY 180 tablet 1  . Biotin 10000 MCG TABS Take 1 tablet by mouth daily.    . celecoxib (CELEBREX) 200 MG capsule TAKE ONE (1) CAPSULE BY MOUTH TWICE A DAY. (EVERY 12 HOURS.) AS NEEDED 60 capsule 6  . Cholecalciferol (VITAMIN D3) 5000 units CAPS Take 1 capsule by mouth daily.    . citalopram (CELEXA)  40 MG tablet TAKE ONE (1) TABLET BY MOUTH EVERY DAY 90 tablet 1  . colchicine 0.6 MG tablet 2 tabs po at onset of gout pain, then 1 tab po one hour later (Patient not taking: Reported on 04/22/2018) 15 tablet 2  . Cyanocobalamin (VITAMIN B-12 PO) Take 1 tablet by mouth daily. Reported on 04/07/2015    . eletriptan (RELPAX) 40 MG tablet TAKE 1 TABLET BY MOUTH EVERY DAY AS NEEDED MAY REPEAT IN 2 HOURS IF NOT IMPROVED 27 tablet 1  . HYDROcodone-acetaminophen (NORCO/VICODIN) 5-325 MG tablet TAKE ONE TABLET BY MOUTH THREE TIMES DAILY AS NEEDED 90 tablet 0  . lidocaine (LIDODERM) 5 % lidocaine 5 % topical patch    .  methocarbamol (ROBAXIN) 500 MG tablet TAKE 1 TO 2 TABLETS BY MOUTH EVERY 6 HOURS AS NEEDED FOR MUSCULAR RELAXATION 30 tablet 5  . pantoprazole (PROTONIX) 40 MG tablet TAKE ONE (1) TABLET BY MOUTH EVERY DAY 90 tablet 3  . tretinoin (RETIN-A) 0.025 % cream Apply 1 application topically at bedtime as needed. For acne prevention/control 45 g 3   No facility-administered medications prior to visit.    Allergies  Allergen Reactions  . Imitrex [Sumatriptan] Other (See Comments)    Face and Arm numbness    ROS As per HPI  PE: There were no vitals taken for this visit. ***  LABS:  ***  IMPRESSION AND PLAN:  No problem-specific Assessment & Plan notes found for this encounter.   An After Visit Summary was printed and given to the patient.  FOLLOW UP: No follow-ups on file.

## 2018-12-27 NOTE — Progress Notes (Signed)
Virtual Visit via Video Note  I connected with pt on 12/27/18 at  3:30 PM EST by a video enabled telemedicine application and verified that I am speaking with the correct person using two identifiers.  Location patient: home Location provider:work or home office Persons participating in the virtual visit: patient, provider  I discussed the limitations of evaluation and management by telemedicine and the availability of in person appointments. The patient expressed understanding and agreed to proceed.  Telemedicine visit is a necessity given the COVID-19 restrictions in place at the current time.  HPI: 58 y/o WF being seen today for f/u chronic pain syndrome, GAD, and adult ADD.  Says she is doing very well.  Her disability has been approved. Indication for chronic opioid:neck, mid back, low back, HA's, myofascial pain, myalgias of appendicular skeleton. Some days finds it hard to get out of bed and function.  She carries dx of DDD of C and L spine, significant scoliosis, myofascial pain syndrome with trigger points and recurrent trochanteric bursitis. Has seen a handful of specialists in the past (spine and scoliosis center in [place], [facility], neurologist, in [place], also HA and wellness center in [place]). She has never been managed by a pain mgmt specialist. Medication and dose:vicodin 5/325, 1 tid prn # pills per month: 90 Last UDS date:10/04/18-->appropriate results. Opioid Treatment Agreement signed (Y/N):Y,10/04/18 Opioid Treatment Agreement last reviewed with patient:today NCCSRS reviewed this encounter (include red flags):today. Most recent fill of vicodin was 12/10/18, # 62, by me. No suspicious activity. PMP AWARE reviewed today.  PAIN: stable, still needs 1 vicodin three times a day and this helps significantly, allows her to be functional/improves quality of life.  Takes care of her friend's kids most days and is very active with them.  No adverse side effects from  med.  Anxiety/dep: historically well controlled on citalopram and alpraz 1 mg bid. Continues to do well. PMP AWARE reviewed today: most recent alprz rx filled 12/09/18, #180, by me No red flags.  Adult ADD: her most recent fill of vyvanse 50 mg was 03/25/18, #30 caps, rx by me. Back on 04/22/18 she told me she weened herself off the vyvanse b/c she was out of it a few days and didn't notice a difference. She wanted to continue OFF of the med as of that time and continues to have desire to stay OFF of this med.  Having some chest tightness, dry cough, ? Wheeze intermittently the last 4 mo or so. Has albuterol HFA but it is > 2 yrs old and she has not tried it. No fevers, no SOB.  Has some chronic mild nasal congestion and sinus pressure intermittently, ear pressure intermittently.  ROS: See pertinent positives and negatives per HPI.  Past Medical History:  Diagnosis Date  . Adult ADHD   . Anxiety   . Aortic atherosclerosis ([place]) 08/2016  . Chronic back pain    neck, too.  MRI L spine 05/2008: 40 degrees dextroscoliosis due to a hemivertebra of L3.  Some DDD/spondylosis w/out spinal stenosis.  . Depression   . Diastolic dysfunction, left ventricle 05/2018   Echo with grd I DD.  Marland Kitchen Gout    2 attacks total as of 12/2014.  Marland Kitchen Kidney stones 2005; 2017   Most recent CT 08/2016 showed <2 mm nonobstructing nephrolithiasis.  . Migraine    Proph tx with topamax helps, abortive tx with relpax helps.  Couldn't afford topamax so d/c'd this 04/2016.    . Navicular fracture, foot 03/2018  Right Navicular fx-nonunion (surgery)  . Pulmonary nodule 08/2016   6 mm LLL pulm nodule---repeat CT noncontrast recommended 6-12 mo.  Marland Kitchen Restless legs syndrome    Pt can get to sleep fine so she declines med for this as of 08/2016    Past Surgical History:  Procedure Laterality Date  . BREAST SURGERY  1999   reduction  . ENDOMETRIAL ABLATION  2002  . FOOT FRACTURE SURGERY  03/2018   Closed displaced fracture  of right navicular, nonunion.  Marland Kitchen KIDNEY STONE SURGERY  2005  . LAPAROSCOPIC VAGINAL HYSTERECTOMY  12/02/07   for DUB  . REDUCTION MAMMAPLASTY    . TRANSTHORACIC ECHOCARDIOGRAM  06/13/2018   Grd I DD    Family History  Problem Relation Age of Onset  . Cancer Mother        brain ca (glioblastoma)  . Breast cancer Paternal Aunt   . Breast cancer Maternal Grandmother      Current Outpatient Medications:  .  ALPRAZolam (XANAX) 1 MG tablet, TAKE ONE TABLET BY MOUTH TWICE A DAY AS NEEDED FOR ANXIETY, Disp: 180 tablet, Rfl: 1 .  Biotin 10000 MCG TABS, Take 1 tablet by mouth daily., Disp: , Rfl:  .  celecoxib (CELEBREX) 200 MG capsule, TAKE ONE (1) CAPSULE BY MOUTH TWICE A DAY. (EVERY 12 HOURS.) AS NEEDED, Disp: 60 capsule, Rfl: 6 .  Cholecalciferol (VITAMIN D3) 5000 units CAPS, Take 1 capsule by mouth daily., Disp: , Rfl:  .  citalopram (CELEXA) 40 MG tablet, TAKE ONE (1) TABLET BY MOUTH EVERY DAY, Disp: 90 tablet, Rfl: 1 .  Cyanocobalamin (VITAMIN B-12 PO), Take 1 tablet by mouth daily. Reported on 04/07/2015, Disp: , Rfl:  .  eletriptan (RELPAX) 40 MG tablet, TAKE 1 TABLET BY MOUTH EVERY DAY AS NEEDED MAY REPEAT IN 2 HOURS IF NOT IMPROVED, Disp: 27 tablet, Rfl: 1 .  HYDROcodone-acetaminophen (NORCO/VICODIN) 5-325 MG tablet, TAKE ONE TABLET BY MOUTH THREE TIMES DAILY AS NEEDED, Disp: 90 tablet, Rfl: 0 .  lidocaine (LIDODERM) 5 %, lidocaine 5 % topical patch, Disp: , Rfl:  .  methocarbamol (ROBAXIN) 500 MG tablet, TAKE 1 TO 2 TABLETS BY MOUTH EVERY 6 HOURS AS NEEDED FOR MUSCULAR RELAXATION, Disp: 30 tablet, Rfl: 5 .  pantoprazole (PROTONIX) 40 MG tablet, TAKE ONE (1) TABLET BY MOUTH EVERY DAY, Disp: 90 tablet, Rfl: 3 .  tretinoin (RETIN-A) 0.025 % cream, Apply 1 application topically at bedtime as needed. For acne prevention/control, Disp: 45 g, Rfl: 3 .  albuterol (VENTOLIN HFA) 108 (90 Base) MCG/ACT inhaler, Inhale 2 puffs into the lungs every 6 (six) hours as needed for wheezing or shortness  of breath. (Patient not taking: Reported on 09/02/2018), Disp: 1 Inhaler, Rfl: 0 .  colchicine 0.6 MG tablet, 2 tabs po at onset of gout pain, then 1 tab po one hour later (Patient not taking: Reported on 04/22/2018), Disp: 15 tablet, Rfl: 2  EXAM:  VITALS per patient if applicable: Temp 0000000 F (37 C) (Temporal)   Wt 154 lb (69.9 kg)   BMI 27.06 kg/m    GENERAL: alert, oriented, appears well and in no acute distress  HEENT: atraumatic, conjunttiva clear, no obvious abnormalities on inspection of external nose and ears  NECK: normal movements of the head and neck  LUNGS: on inspection no signs of respiratory distress, breathing rate appears normal, no obvious gross SOB, gasping or wheezing  CV: no obvious cyanosis  MS: moves all visible extremities without noticeable abnormality  PSYCH/NEURO: pleasant and  cooperative, no obvious depression or anxiety, speech and thought processing grossly intact  LABS: none today  Lab Results  Component Value Date   TSH 0.61 10/04/2018   Lab Results  Component Value Date   WBC 4.1 10/04/2018   HGB 13.7 10/04/2018   HCT 40.4 10/04/2018   MCV 86.0 10/04/2018   PLT 188.0 10/04/2018   Lab Results  Component Value Date   CREATININE 0.75 10/04/2018   BUN 9 10/04/2018   NA 141 10/04/2018   K 4.2 10/04/2018   CL 107 10/04/2018   CO2 26 10/04/2018   Lab Results  Component Value Date   ALT 9 10/04/2018   AST 16 10/04/2018   ALKPHOS 85 10/04/2018   BILITOT 0.6 10/04/2018   Lab Results  Component Value Date   CHOL 192 10/04/2018   Lab Results  Component Value Date   HDL 41.00 10/04/2018   Lab Results  Component Value Date   LDLCALC 126 (H) 10/04/2018   Lab Results  Component Value Date   TRIG 127.0 10/04/2018   Lab Results  Component Value Date   CHOLHDL 5 10/04/2018    ASSESSMENT AND PLAN:  Discussed the following assessment and plan:  1) Chronic pain syndrome: back pain from c-spine to sacral spine due to severe  scoliosis, also with myofascial pain syndrome diffusely and recurrent trochanteric bursitis. The current medical regimen is effective;  continue present plan and medications. I did electronic rx's for vicodin 5/325, 1 tid prn, #90 today for each of the next 3 months.  Appropriate fill on/after date was noted on each rx. CSC and UDS are UTD.  2) GAD, hx of MDD: doing very well.  The current medical regimen is effective;  continue present plan and medications. No new rx for xanax needed at this time. CSC and UDS UTD.  3) Adult ADD: doing fine OFF OF MEDS.  4) Intermittent dry cough with mild wheeze:  w/out fever, mucous production, hemoptysis, chest pain, or SOB/DOE.  Has hx of RAD, will rx new albuterol inhaler for her to try. Call if not responsive to this and/or sx's worsening.   I discussed the assessment and treatment plan with the patient. The patient was provided an opportunity to ask questions and all were answered. The patient agreed with the plan and demonstrated an understanding of the instructions.   The patient was advised to call back or seek an in-person evaluation if the symptoms worsen or if the condition fails to improve as anticipated.   F/u: 3 mo  Signed:  Crissie Sickles, MD           12/27/2018

## 2019-01-02 ENCOUNTER — Telehealth: Payer: Self-pay

## 2019-01-02 NOTE — Telephone Encounter (Signed)
PA sent via covermymed on 01/01/19   Key: [id number]   Medication: Lidocaine 5% patches   Dx: M54.9, G89.29   Per Dr. Anitra Lauth pt has tried and failed n/a   Waiting for response.   Response received 01/02/19, Approved thru 01/01/20

## 2019-01-17 HISTORY — PX: OTHER SURGICAL HISTORY: SHX169

## 2019-02-01 ENCOUNTER — Other Ambulatory Visit: Payer: Self-pay | Admitting: Family Medicine

## 2019-02-10 ENCOUNTER — Other Ambulatory Visit: Payer: Self-pay | Admitting: Family Medicine

## 2019-02-10 NOTE — Telephone Encounter (Signed)
Requesting: alprazolam Contract:10/04/18 UDS:10/04/18 Last Visit:12/27/18 Next Visit: advised to f/u March Last Refill:11/08/18 (180,1) last pick up was 12/09/18 for #180  Please Advise. Medication pending

## 2019-02-10 NOTE — Telephone Encounter (Signed)
That is correct. Contacted pharmacy already and no other refills left. Pickup was 12/09/18

## 2019-02-10 NOTE — Telephone Encounter (Signed)
Double check me but it looks like this request is 1 month early, right?

## 2019-04-07 ENCOUNTER — Other Ambulatory Visit: Payer: Self-pay

## 2019-04-07 ENCOUNTER — Encounter: Payer: Self-pay | Admitting: Family Medicine

## 2019-04-07 ENCOUNTER — Ambulatory Visit (INDEPENDENT_AMBULATORY_CARE_PROVIDER_SITE_OTHER): Payer: 59 | Admitting: Family Medicine

## 2019-04-07 VITALS — BP 132/92 | HR 63

## 2019-04-07 DIAGNOSIS — M7918 Myalgia, other site: Secondary | ICD-10-CM | POA: Diagnosis not present

## 2019-04-07 DIAGNOSIS — F411 Generalized anxiety disorder: Secondary | ICD-10-CM

## 2019-04-07 DIAGNOSIS — M549 Dorsalgia, unspecified: Secondary | ICD-10-CM

## 2019-04-07 DIAGNOSIS — G8929 Other chronic pain: Secondary | ICD-10-CM

## 2019-04-07 DIAGNOSIS — G894 Chronic pain syndrome: Secondary | ICD-10-CM

## 2019-04-07 DIAGNOSIS — R030 Elevated blood-pressure reading, without diagnosis of hypertension: Secondary | ICD-10-CM

## 2019-04-07 NOTE — Progress Notes (Signed)
Virtual Visit via Video Note  I connected with pt on 04/07/19 at 11:30 AM EDT by a video enabled telemedicine application and verified that I am speaking with the correct person using two identifiers.  Location patient: home Location provider:work or home office Persons participating in the virtual visit: patient, provider  I discussed the limitations of evaluation and management by telemedicine and the availability of in person appointments. The patient expressed understanding and agreed to proceed.  Telemedicine visit is a necessity given the COVID-19 restrictions in place at the current time.  HPI: 59 y/o WF being seen today for elevated bp, f/u chronic pain syndrome, and f/u GAD. Excessive stress, caring for parents in law full time, had to quit her job. Says he body is not doing well with all this. Checked bp 150/90--typical the last week or so. Last 2d, 130/80 avg.   Couple diastolics in 0000000.   Review of past bp measurements since 2019 in EMR show some diast in 80s but o/w normal. She does drink 6 sundrop sodas per day. Lots of caffeine, sugar, and sodium in them. She has switched over to diet caffeine free version and drinking more water.  Indication for chronic opioid:neck, mid back, low back, HA's, myofascial pain, myalgias of appendicular skeleton. Some days finds it hard to get out of bed and function.  She carries dx of DDD of C and L spine, significant scoliosis, myofascial pain syndrome with trigger points and recurrent trochanteric bursitis. Has seen a handful of specialists in the past (spine and scoliosis center in [place], [facility], neurologist, in [place], also [facility] in [place]). She has never been managed by a pain mgmt specialist. Medication and dose:vicodin 5/325, 1 tid prn # pills per month: 90  PMP AWARE reviewed today: most recent rx for Vicodin 5/325 was filled 03/31/19, # 58, rx by me. Most recent alpraz 1mg  rx filled 12/09/18, #180, rx by me. No  red flags.  Her pain is the same, requires 3 pain pills per day typically.  Has had only 1 HA in the last 2 mo !  Her anxiety is high, although she is taking less xanax b/c of needing to be more alert and capable of tending to her father's parents 24/7.  Still taking citalopram 40mg  qd.  ROS: no fevers, no CP, no SOB, no wheezing, no cough, no dizziness, no HAs, no rashes, no melena/hematochezia.  No polyuria or polydipsia.  Chronic myalgias and arthralgias.  No focal weakness, paresthesias, or tremors.  No acute vision or hearing abnormalities. No n/v/d or abd pain.  Occ brief palpitations.     Past Medical History:  Diagnosis Date  . Adult ADHD   . Anxiety   . Aortic atherosclerosis ([place]) 08/2016  . Chronic back pain    neck, too.  MRI L spine 05/2008: 40 degrees dextroscoliosis due to a hemivertebra of L3.  Some DDD/spondylosis w/out spinal stenosis.  . Depression   . Diastolic dysfunction, left ventricle 05/2018   Echo with grd I DD.  Marland Kitchen Gout    2 attacks total as of 12/2014.  Marland Kitchen Kidney stones 2005; 2017   Most recent CT 08/2016 showed <2 mm nonobstructing nephrolithiasis.  . Migraine    Proph tx with topamax helps, abortive tx with relpax helps.  Couldn't afford topamax so d/c'd this 04/2016.    . Navicular fracture, foot 03/2018   Right Navicular fx-nonunion (surgery)  . Pulmonary nodule 08/2016   6 mm LLL pulm nodule---repeat CT  noncontrast recommended 6-12 mo.  Marland Kitchen Restless legs syndrome    Pt can get to sleep fine so she declines med for this as of 08/2016    Past Surgical History:  Procedure Laterality Date  . BREAST SURGERY  1999   reduction  . ENDOMETRIAL ABLATION  2002  . FOOT FRACTURE SURGERY  03/2018   Closed displaced fracture of right navicular, nonunion.  Marland Kitchen KIDNEY STONE SURGERY  2005  . LAPAROSCOPIC VAGINAL HYSTERECTOMY  12/02/07   for DUB  . REDUCTION MAMMAPLASTY    . TRANSTHORACIC ECHOCARDIOGRAM  06/13/2018   Grd I DD    Family History  Problem Relation  Age of Onset  . Cancer Mother        brain ca (glioblastoma)  . Breast cancer Paternal Aunt   . Breast cancer Maternal Grandmother      Current Outpatient Medications:  .  ALPRAZolam (XANAX) 1 MG tablet, TAKE ONE TABLET BY MOUTH TWICE A DAY AS NEEDED FOR ANXIETY, Disp: 180 tablet, Rfl: 1 .  Biotin 10000 MCG TABS, Take 1 tablet by mouth daily., Disp: , Rfl:  .  celecoxib (CELEBREX) 200 MG capsule, TAKE ONE (1) CAPSULE BY MOUTH TWICE A DAY. (EVERY 12 HOURS.) AS NEEDED, Disp: 60 capsule, Rfl: 6 .  Cholecalciferol (VITAMIN D3) 5000 units CAPS, Take 1 capsule by mouth daily., Disp: , Rfl:  .  citalopram (CELEXA) 40 MG tablet, TAKE ONE (1) TABLET BY MOUTH EVERY DAY, Disp: 90 tablet, Rfl: 1 .  Cyanocobalamin (VITAMIN B-12 PO), Take 1 tablet by mouth daily. Reported on 04/07/2015, Disp: , Rfl:  .  eletriptan (RELPAX) 40 MG tablet, TAKE 1 TABLET BY MOUTH EVERY DAY AS NEEDED MAY REPEAT IN 2 HOURS IF NOT IMPROVED, Disp: 27 tablet, Rfl: 1 .  HYDROcodone-acetaminophen (NORCO/VICODIN) 5-325 MG tablet, Take 1 tablet by mouth 3 (three) times daily as needed., Disp: 90 tablet, Rfl: 0 .  lidocaine (LIDODERM) 5 %, 1 patch to the affected area q12h prn, Disp: 30 patch, Rfl: 11 .  methocarbamol (ROBAXIN) 500 MG tablet, TAKE 1 TO 2 TABLETS BY MOUTH EVERY 6 HOURS AS NEEDED FOR MUSCULAR RELAXATION, Disp: 30 tablet, Rfl: 5 .  pantoprazole (PROTONIX) 40 MG tablet, TAKE ONE (1) TABLET BY MOUTH EVERY DAY, Disp: 90 tablet, Rfl: 3 .  tretinoin (RETIN-A) 0.025 % cream, Apply 1 application topically at bedtime as needed. For acne prevention/control, Disp: 45 g, Rfl: 3 .  albuterol (VENTOLIN HFA) 108 (90 Base) MCG/ACT inhaler, Inhale 2 puffs into the lungs every 4 (four) hours as needed for wheezing or shortness of breath. (Patient not taking: Reported on 04/07/2019), Disp: 18 g, Rfl: 0 .  colchicine 0.6 MG tablet, 2 tabs po at onset of gout pain, then 1 tab po one hour later (Patient not taking: Reported on 04/22/2018), Disp: 15  tablet, Rfl: 2  EXAM:  VITALS per patient if applicable: BP (!) [id number] (BP Location: Left Arm, Patient Position: Sitting, Cuff Size: Normal)   Pulse 63    GENERAL: alert, oriented, appears well and in no acute distress  HEENT: atraumatic, conjunttiva clear, no obvious abnormalities on inspection of external nose and ears  NECK: normal movements of the head and neck  LUNGS: on inspection no signs of respiratory distress, breathing rate appears normal, no obvious gross SOB, gasping or wheezing  CV: no obvious cyanosis  MS: moves all visible extremities without noticeable abnormality  PSYCH/NEURO: pleasant and cooperative, no obvious depression or anxiety, speech and thought processing grossly intact  LABS: none today    Chemistry      Component Value Date/Time   NA 141 10/04/2018 1326   K 4.2 10/04/2018 1326   CL 107 10/04/2018 1326   CO2 26 10/04/2018 1326   BUN 9 10/04/2018 1326   CREATININE 0.75 10/04/2018 1326      Component Value Date/Time   CALCIUM 9.8 10/04/2018 1326   ALKPHOS 85 10/04/2018 1326   AST 16 10/04/2018 1326   ALT 9 10/04/2018 1326   BILITOT 0.6 10/04/2018 1326      ASSESSMENT AND PLAN:  Discussed the following assessment and plan:  1) Elevated bp w/out dx HTN. Given her hx of diast dysfxn on echo, she very well could have had HTN for some time now. Given her overall situation at this time, though, we held off on starting med and stressed the importance of low Na, low sugar and caffeine intake, and try to take some daytime breaks for stress relief. Call back or return if bp consistently >145 syst of >95 diast or if otherwise worried.  2) Chronic pain syndrome-->widespread myofascial pain, chronic neck/thoracic/and lumbar back pain. The current medical regimen is effective (vicodin 5/325, #90 per month);  continue present plan and medications. No new rx's were needed for this condition today. CSC UTD.  3) Chronic anxiety: worsened since poor  psychosocial situation last few months but hanging in there.  Continue on prn alpraz and daily citalopram.  No new alpraz rx needed today.   I discussed the assessment and treatment plan with the patient. The patient was provided an opportunity to ask questions and all were answered. The patient agreed with the plan and demonstrated an understanding of the instructions.   The patient was advised to call back or seek an in-person evaluation if the symptoms worsen or if the condition fails to improve as anticipated.  F/u: 3 mo f/u chronic pain,earlier if bp continues to be an issue.  Signed:  Crissie Sickles, MD           04/07/2019

## 2019-04-08 ENCOUNTER — Other Ambulatory Visit: Payer: Self-pay

## 2019-04-09 ENCOUNTER — Encounter: Payer: Self-pay | Admitting: Family Medicine

## 2019-04-09 ENCOUNTER — Other Ambulatory Visit: Payer: Self-pay

## 2019-04-09 MED ORDER — ELETRIPTAN HYDROBROMIDE 40 MG PO TABS: ORAL_TABLET | ORAL | 0 refills | Status: DC

## 2019-04-14 ENCOUNTER — Other Ambulatory Visit: Payer: Self-pay | Admitting: Family Medicine

## 2019-04-26 ENCOUNTER — Emergency Department (HOSPITAL_BASED_OUTPATIENT_CLINIC_OR_DEPARTMENT_OTHER): Payer: 59

## 2019-04-26 ENCOUNTER — Other Ambulatory Visit: Payer: Self-pay

## 2019-04-26 ENCOUNTER — Emergency Department (HOSPITAL_BASED_OUTPATIENT_CLINIC_OR_DEPARTMENT_OTHER)
Admission: EM | Admit: 2019-04-26 | Discharge: 2019-04-26 | Disposition: A | Discharge status: Home / Self Care | Payer: 59 | Attending: Emergency Medicine | Admitting: Emergency Medicine

## 2019-04-26 ENCOUNTER — Encounter (HOSPITAL_BASED_OUTPATIENT_CLINIC_OR_DEPARTMENT_OTHER): Payer: Self-pay | Admitting: Emergency Medicine

## 2019-04-26 DIAGNOSIS — Z79899 Other long term (current) drug therapy: Secondary | ICD-10-CM | POA: Diagnosis not present

## 2019-04-26 DIAGNOSIS — M5489 Other dorsalgia: Secondary | ICD-10-CM | POA: Diagnosis present

## 2019-04-26 DIAGNOSIS — M5431 Sciatica, right side: Secondary | ICD-10-CM | POA: Insufficient documentation

## 2019-04-26 MED ORDER — TIZANIDINE HCL 4 MG PO TABS: 4.0000 mg | ORAL_TABLET | Freq: Four times a day (QID) | ORAL | 0 refills | Status: DC | PRN

## 2019-04-26 MED ORDER — PREDNISONE 20 MG PO TABS: 40.0000 mg | ORAL_TABLET | Freq: Every day | ORAL | 0 refills | Status: AC

## 2019-04-26 MED ORDER — DEXAMETHASONE SODIUM PHOSPHATE 10 MG/ML IJ SOLN
8.0000 mg | Freq: Once | INTRAMUSCULAR | Status: AC
  Administered 2019-04-26: 8 mg via INTRAMUSCULAR
  Filled 2019-04-26: qty 1

## 2019-04-26 NOTE — Discharge Instructions (Signed)
Take prednisone as directed.  Take Zanaflex as directed.  It may make you slightly drowsy.  I provided you a referral to neurosurgery.  Please follow-up with them.  As we discussed, discussed with your good doctor regarding your high blood pressure today.  This is most likely because you are in pain but is still needs to be followed up with with your primary care doctor.  Return to the Emergency Department immediately for any worsening back pain, neck pain, difficulty walking, numbness/weaknss of your arms or legs, urinary or bowel accidents, fever or any other worsening or concerning symptoms.

## 2019-04-26 NOTE — ED Provider Notes (Signed)
[facility] EMERGENCY DEPARTMENT Provider Note   CSN: [id number] Arrival date & time: 04/26/19  1525     History Chief Complaint  Patient presents with  . Back Pain    Jacqueline Vance is a 59 y.o. female for evaluation of right-sided back pain that radiates into the posterior aspect of her right lower extremity.  Patient reports about 3 weeks ago, she was pulling her in-laws out of a ditch and states that she tweaked her back.  She did not fall.  She reports that since then she has had pain to the right side of the back and states that she has a sharp pain that radiates down the posterior aspect of her gluteus into her leg.  Her pain is worse with movement or trying to bend positions.  She has still been able to ambulate.  She reports she has a history of chronic back pain.  She saw neurosurgeon about 20 years ago but has not seen anybody since then.  She has been taking her pain medication as well as using ice with no improvement in symptoms. Denies fevers, weight loss, numbness/weakness of upper and lower extremities, bowel/bladder incontinence, saddle anesthesia, history of back surgery, history of IVDA.   The history is provided by the patient.       Past Medical History:  Diagnosis Date  . Adult ADHD   . Anxiety   . Aortic atherosclerosis ([place]) 08/2016  . Chronic back pain    neck, too.  MRI L spine 05/2008: 40 degrees dextroscoliosis due to a hemivertebra of L3.  Some DDD/spondylosis w/out spinal stenosis.  . Depression   . Diastolic dysfunction, left ventricle 05/2018   Echo with grd I DD.  Marland Kitchen Gout    2 attacks total as of 12/2014.  Marland Kitchen Kidney stones 2005; 2017   Most recent CT 08/2016 showed <2 mm nonobstructing nephrolithiasis.  . Migraine    Proph tx with topamax helps, abortive tx with relpax helps.  Couldn't afford topamax so d/c'd this 04/2016.    . Navicular fracture, foot 03/2018   Right Navicular fx-nonunion (surgery)  . Pulmonary nodule 08/2016   6 mm LLL  pulm nodule---repeat CT noncontrast recommended 6-12 mo.  Marland Kitchen Restless legs syndrome    Pt can get to sleep fine so she declines med for this as of 08/2016    Patient Active Problem List   Diagnosis Date Noted  . Pain in right foot 03/14/2018  . Pain in joint of right elbow 03/14/2018  . Migraines 01/23/2018  . Chronic mixed headache syndrome 08/03/2014  . Cervicalgia 08/03/2014  . Chronic low back pain 08/03/2014  . Anxiety and depression 08/03/2014  . Adult ADHD 07/30/2013  . GAD (generalized anxiety disorder) 07/17/2013  . Health maintenance examination 08/12/2012  . Lymphadenitis 05/26/2011  . Viral syndrome 05/26/2011  . Fatigue 05/26/2011  . Vitamin D deficiency 05/26/2011  . Vitamin B12 deficiency 05/26/2011  . Overweight(278.02) 01/26/2011  . Chronic back pain 08/10/2010  . Depression 08/10/2010  . History of migraine headaches 08/10/2010  . Preventative health care 08/10/2010    Past Surgical History:  Procedure Laterality Date  . BREAST SURGERY  1999   reduction  . ENDOMETRIAL ABLATION  2002  . FOOT FRACTURE SURGERY  03/2018   Closed displaced fracture of right navicular, nonunion.  Marland Kitchen KIDNEY STONE SURGERY  2005  . LAPAROSCOPIC VAGINAL HYSTERECTOMY  12/02/07   for DUB  . REDUCTION MAMMAPLASTY    . TRANSTHORACIC ECHOCARDIOGRAM  06/13/2018  Grd I DD     OB History    Gravida  1   Para  1   Term      Preterm      AB      Living        SAB      TAB      Ectopic      Multiple      Live Births              Family History  Problem Relation Age of Onset  . Cancer Mother        brain ca (glioblastoma)  . Breast cancer Paternal Aunt   . Breast cancer Maternal Grandmother     Social History   Tobacco Use  . Smoking status: Never Smoker  . Smokeless tobacco: Never Used  Substance Use Topics  . Alcohol use: No  . Drug use: No    Home Medications Prior to Admission medications   Medication Sig Start Date End Date Taking?  Authorizing Provider  ALPRAZolam (XANAX) 1 MG tablet TAKE ONE TABLET BY MOUTH TWICE A DAY AS NEEDED FOR ANXIETY 11/08/18  Yes McGowen, Adrian Blackwater, MD  Biotin 10000 MCG TABS Take 1 tablet by mouth daily.   Yes [provider]  celecoxib (CELEBREX) 200 MG capsule TAKE ONE (1) CAPSULE BY MOUTH TWICE A DAY. (EVERY 12 HOURS.) AS NEEDED 07/23/18  Yes McGowen, Adrian Blackwater, MD  Cholecalciferol (VITAMIN D3) 5000 units CAPS Take 1 capsule by mouth daily.   Yes [provider]  citalopram (CELEXA) 40 MG tablet TAKE ONE (1) TABLET BY MOUTH EVERY DAY 04/14/19  Yes McGowen, Adrian Blackwater, MD  eletriptan (RELPAX) 40 MG tablet TAKE 1 TABLET BY MOUTH EVERY DAY AS NEEDED MAY REPEAT IN 2 HOURS IF NOT IMPROVED 04/09/19  Yes McGowen, Adrian Blackwater, MD  HYDROcodone-acetaminophen (NORCO/VICODIN) 5-325 MG tablet Take 1 tablet by mouth 3 (three) times daily as needed. 12/27/18  Yes McGowen, Adrian Blackwater, MD  methocarbamol (ROBAXIN) 500 MG tablet TAKE 1 TO 2 TABLETS BY MOUTH EVERY 6 HOURS AS NEEDED FOR MUSCULAR RELAXATION 06/29/18  Yes McGowen, Adrian Blackwater, MD  pantoprazole (PROTONIX) 40 MG tablet TAKE ONE (1) TABLET BY MOUTH EVERY DAY 12/03/18  Yes McGowen, Adrian Blackwater, MD  albuterol (VENTOLIN HFA) 108 (90 Base) MCG/ACT inhaler Inhale 2 puffs into the lungs every 4 (four) hours as needed for wheezing or shortness of breath. Patient not taking: Reported on 04/07/2019 12/27/18   Tammi Sou, MD  colchicine 0.6 MG tablet 2 tabs po at onset of gout pain, then 1 tab po one hour later Patient not taking: Reported on 04/22/2018 01/15/15   Tammi Sou, MD  Cyanocobalamin (VITAMIN B-12 PO) Take 1 tablet by mouth daily. Reported on 04/07/2015    [provider]  lidocaine (LIDODERM) 5 % 1 patch to the affected area q12h prn 12/27/18   McGowen, Adrian Blackwater, MD  predniSONE (DELTASONE) 20 MG tablet Take 2 tablets (40 mg total) by mouth daily for 4 days. 04/26/19 04/30/19  Volanda Napoleon, PA-C  tiZANidine (ZANAFLEX) 4 MG tablet Take 1  tablet (4 mg total) by mouth every 6 (six) hours as needed for muscle spasms. 04/26/19   Volanda Napoleon, PA-C  tretinoin (RETIN-A) 0.025 % cream Apply 1 application topically at bedtime as needed. For acne prevention/control 09/06/18   McGowen, Adrian Blackwater, MD    Allergies    Imitrex [sumatriptan]  Review of Systems   Review  of Systems  Constitutional: Negative for fever.  Musculoskeletal: Positive for back pain.  Neurological: Negative for weakness, numbness and headaches.  All other systems reviewed and are negative.   Physical Exam Updated Vital Signs BP (!) 172/94 (BP Location: Left Arm)   Pulse 60   Temp 97.8 F (36.6 C) (Oral)   Resp 16   Ht 5\' 4"  (1.626 m)   Wt 70.3 kg   SpO2 98%   BMI 26.61 kg/m   Physical Exam Vitals and nursing note reviewed.  Constitutional:      Appearance: She is well-developed.  HENT:     Head: Normocephalic and atraumatic.  Eyes:     General: No scleral icterus.       Right eye: No discharge.        Left eye: No discharge.     Conjunctiva/sclera: Conjunctivae normal.  Cardiovascular:     Pulses:          Dorsalis pedis pulses are 2+ on the right side and 2+ on the left side.  Pulmonary:     Effort: Pulmonary effort is normal.  Musculoskeletal:     Comments: No midline T-spine tenderness.  Diffuse muscular tenderness into the right paraspinal muscles of the lumbar region that extend to the midline.  No deformity or crepitus noted.  The tenderness extends into the muscular gluteal area.  No bony tenderness noted to right hip.  Skin:    General: Skin is warm and dry.     Comments: Good distal cap refill.  RLE is not dusky in appearance or cool to touch.  Neurological:     Mental Status: She is alert.     Comments: Sensation intact along major nerve distributions of BLE. Positive straight leg raise on right lower extremity.  Psychiatric:        Speech: Speech normal.        Behavior: Behavior normal.     ED Results / Procedures /  Treatments   Labs (all labs ordered are listed, but only abnormal results are displayed) Labs Reviewed - No data to display  EKG None  Radiology DG Lumbar Spine Complete  Result Date: 04/26/2019 CLINICAL DATA:  Low back pain, lifting injury EXAM: LUMBAR SPINE - COMPLETE 4+ VIEW COMPARISON:  CT abdomen/pelvis dated 08/21/2016 FINDINGS: Left lateral wedging of the L2-3 vertebral bodies with fusion, possibly reflecting a congenital variant and unchanged from 2018, with associated mid lumbar dextroscoliosis. Reversal of the normal upper lumbar lordosis. No evidence of acute fracture or dislocation. Vertebral body heights otherwise maintained. Mild multilevel degenerative changes, similar to 2018. Visualized bony pelvis appears intact. 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 Mild degenerative changes. No evidence of acute fracture or dislocation. Electronically Signed   By: Julian Hy M.D.   On: 04/26/2019 16:05    Procedures Procedures (including critical care time)  Medications Ordered in ED Medications  dexamethasone (DECADRON) injection 8 mg (8 mg Intramuscular Given 04/26/19 1651)    ED Course  I have reviewed the triage vital signs and the nursing notes.  Pertinent labs & imaging results that were available during my care of the patient were reviewed by me and considered in my medical decision making (see chart for details).    MDM Rules/Calculators/A&P                      58 year old female who presents for evaluation of right-sided back pain that radiates to the right  lower extremity that has been ongoing for the last 3 weeks.  No numbness/weakness.  She has chronic back pain and has not seen a neurosurgeon last 20 years.  No neuro deficits noted.  No red flags.  On initial arrival, she is afebrile, nontoxic-appearing.  Vital signs are stable.  She is slightly hypertensive.  She does report that she had a history of  hypertension but states that it improved and she had been working with her doctor and had gone off on the medication.  She denies any symptoms at this time.  On exam, she has tenderness palpation noted to the paraspinal muscles of the right lumbar region that extends in the gluteal region.  He also has a positive straight leg raise.  Exam consistent with sciatica.  Studies ordered at triage.  Given lack of trauma, do not suspect acute fracture dislocation.  Additionally, history/physical exam not concerning for cauda equina, spinal abscess.  X-rays reviewed.  There is a congenital variant noted at L2-L3.  No acute bony fracture.  I discussed results with patient.  We will plan to treat as sciatica.  Patient reports she has not done well with Robaxin.  We will plan to try Zanaflex.  Additionally, give her a short course of prednisone burst to help with inflammation.  Patient given a referral to neurosurgery and told to follow-up with them. At this time, patient exhibits no emergent life-threatening condition that require further evaluation in ED or admission. Patient had ample opportunity for questions and discussion. All patient's questions were answered with full understanding. Strict return precautions discussed. Patient expresses understanding and agreement to plan.   Portions of this note were generated with [employer]. Dictation errors may occur despite best attempts at proofreading.   Final Clinical Impression(s) / ED Diagnoses Final diagnoses:  Sciatica of right side    Rx / DC Orders ED Discharge Orders         Ordered    predniSONE (DELTASONE) 20 MG tablet  Daily     04/26/19 1701    tiZANidine (ZANAFLEX) 4 MG tablet  Every 6 hours PRN     04/26/19 1701           Volanda Napoleon, PA-C 04/26/19 1716    Little, Wenda Overland, MD 04/26/19 1717

## 2019-04-26 NOTE — ED Triage Notes (Signed)
Low back pain radiating into leg x 3 weeks after helping her elderly family member who fell.

## 2019-05-09 ENCOUNTER — Other Ambulatory Visit: Payer: Self-pay | Admitting: Family Medicine

## 2019-05-12 NOTE — Telephone Encounter (Signed)
Requesting: alprazolam Contract:10/04/18 UDS:10/04/18 Last Visit:04/07/19 Next Visit: advised to f/u 3 mo. Last Refill:11/08/18(180,1)  Please Advise. Medication pending

## 2019-05-13 NOTE — Telephone Encounter (Signed)
Refill sent.

## 2019-06-11 ENCOUNTER — Other Ambulatory Visit: Payer: Self-pay | Admitting: Family Medicine

## 2019-06-11 NOTE — Telephone Encounter (Signed)
Requesting: Norco Contract:10/04/18 UDS:10/04/18 Last Visit:04/07/19 Next Visit:advised to f/u 6 mo. Last Refill:12/27/18(90,0)  Please Advise. Medication pending

## 2019-06-23 IMAGING — CT CT RENAL STONE PROTOCOL
2 of 4 series · 15 of 46 positions shown, 17 images · non-contrast
Comparison: Abdominal radiograph June 11, 2012

CLINICAL DATA: Bilateral flank pain for 2 days, nausea, vomiting
and diarrhea. History of kidney stones, chronic back pain.

EXAM:
CT ABDOMEN AND PELVIS WITHOUT CONTRAST
TECHNIQUE: Multidetector CT imaging of the abdomen and pelvis was performed
following the standard protocol without IV contrast.

[Series 2: axial st · axial · 0.68mm/px · z∈[+730,+1136]mm · 12 of 93 slices shown, 14 images]
[im 8/93  soft-tissue]
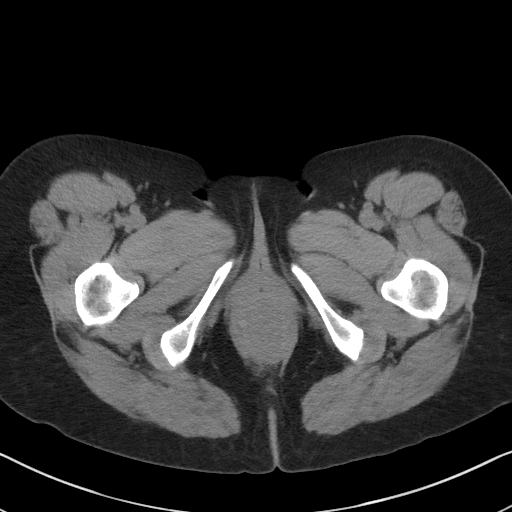
[im 8/93  bone]
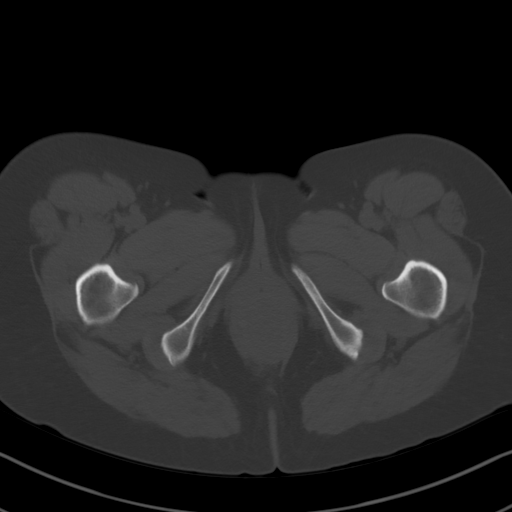
[im 15/93  soft-tissue]
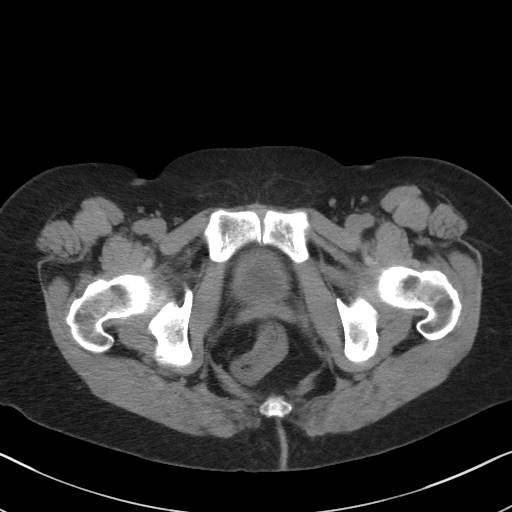
[im 23/93  soft-tissue]
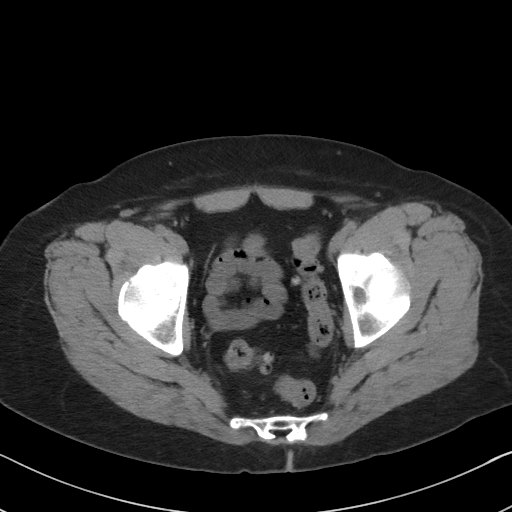
[im 30/93  soft-tissue]
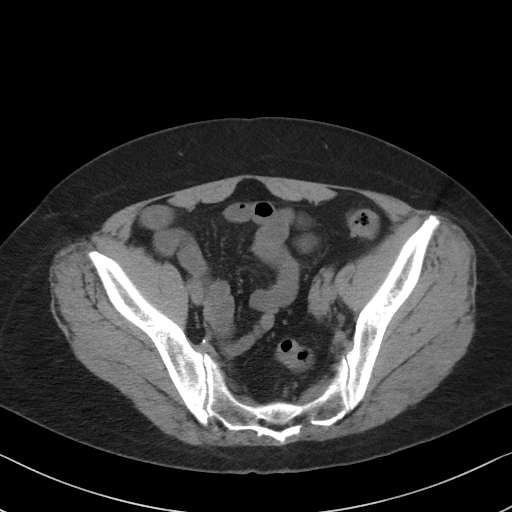
[im 37/93  soft-tissue]
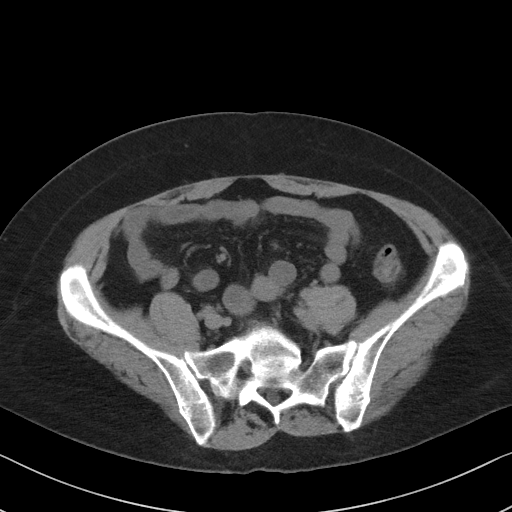
[im 45/93  soft-tissue]
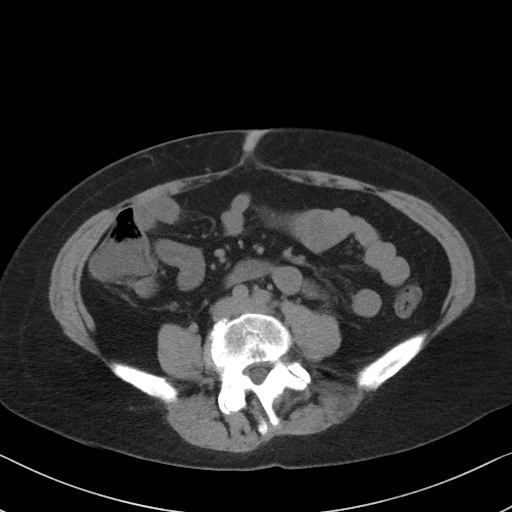
[im 52/93  soft-tissue]
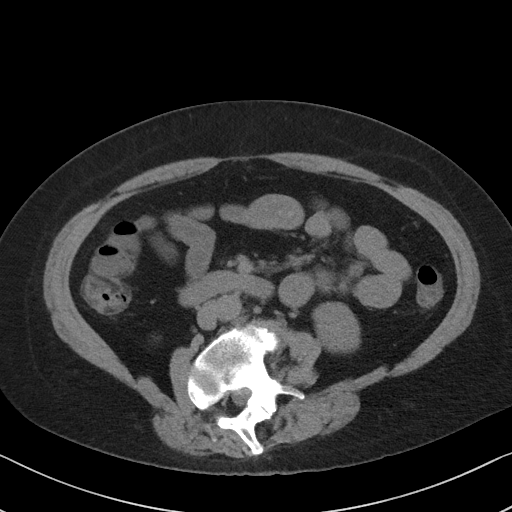
[im 59/93  soft-tissue]
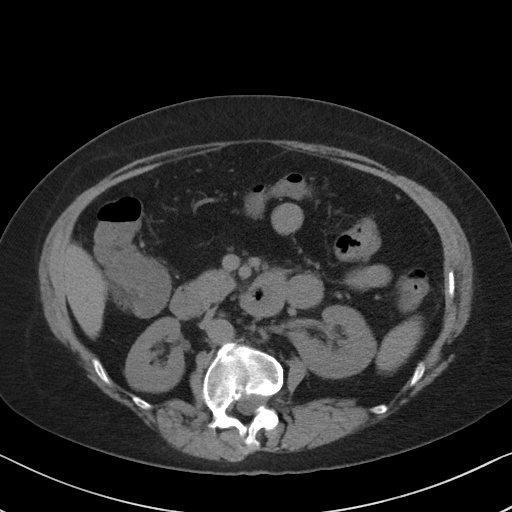
[im 67/93  soft-tissue]
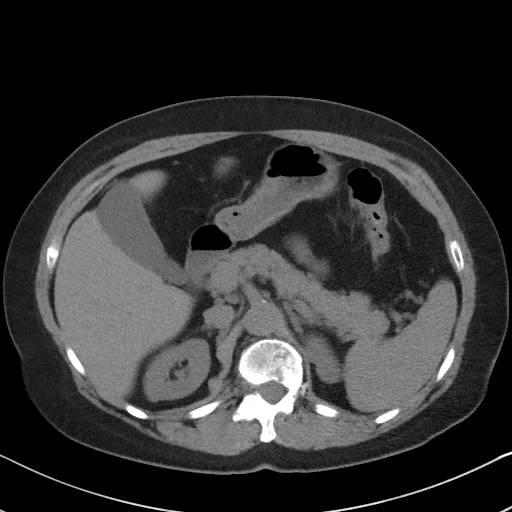
[im 67/93  bone]
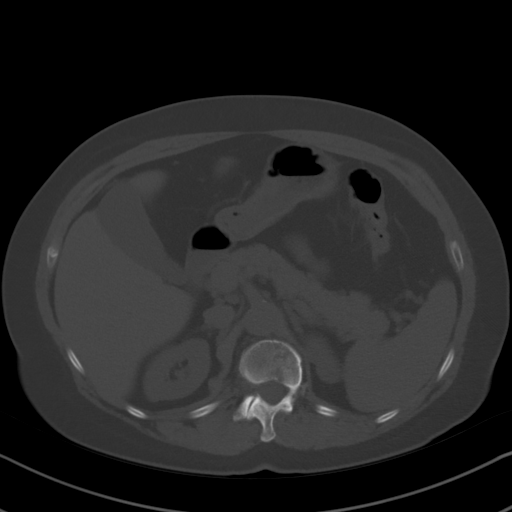
[im 74/93  soft-tissue]
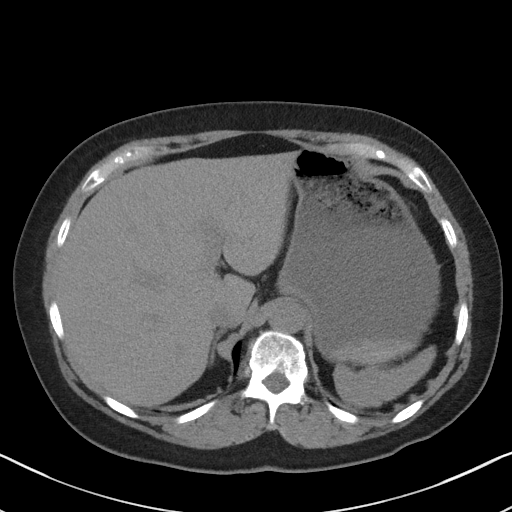
[im 81/93  soft-tissue]
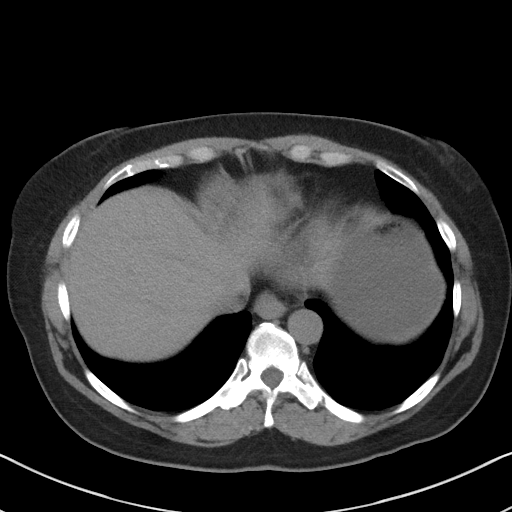
[im 89/93  soft-tissue]
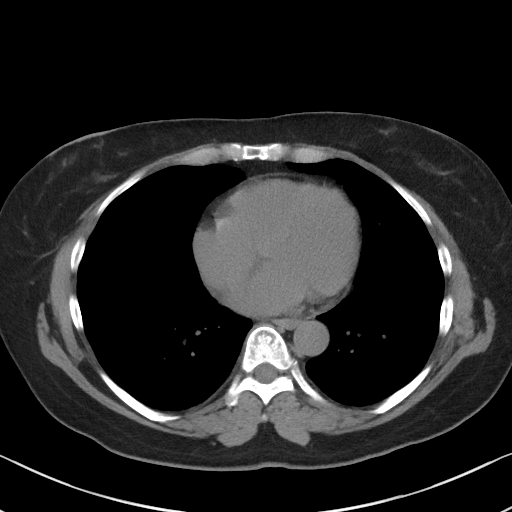

[Series 5: coronal st · coronal · 0.64mm/px · 3 of 88 slices shown]
[im 30/88  soft-tissue]
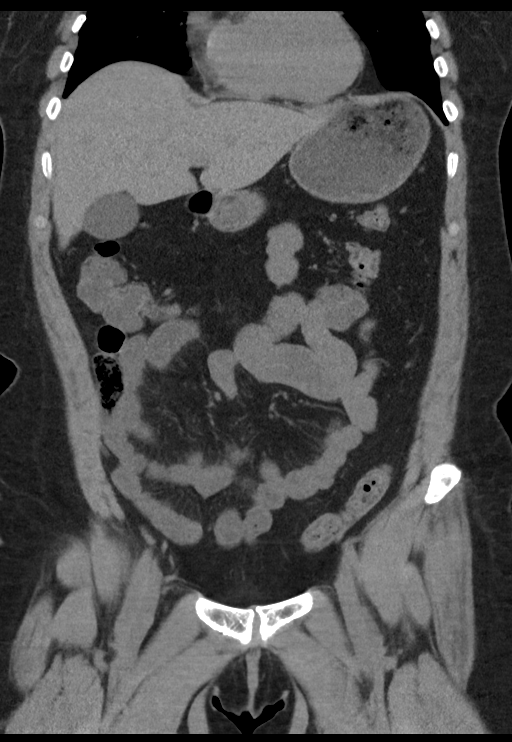
[im 39/88  soft-tissue]
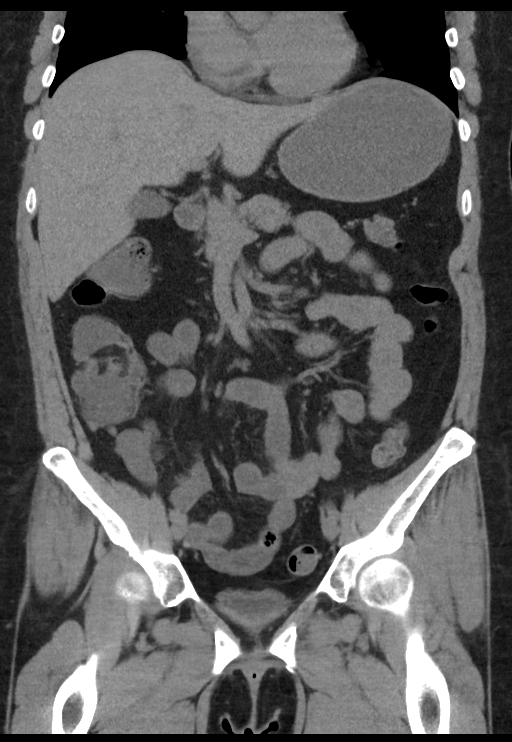
[im 49/88  soft-tissue]
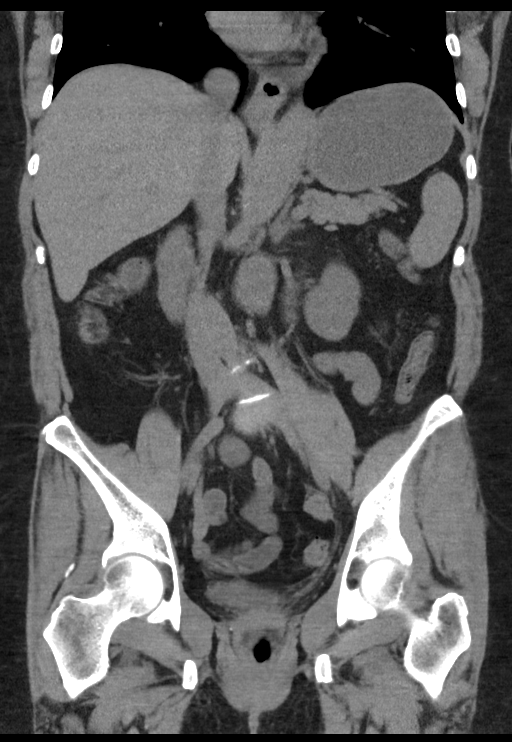

[15 of 46 positions shown; findings below may reference images not displayed]

FINDINGS: LOWER CHEST: RIGHT lower lobe bandlike atelectasis. 6 mm pulmonary
nodule LEFT lower lobe, lateral segment (series 4, image 14/23). The
visualized heart size is normal. No pericardial effusion.

HEPATOBILIARY: Normal.

PANCREAS: Normal.

SPLEEN: Normal.

ADRENALS/URINARY TRACT: Kidneys are orthotopic, demonstrating normal
size and morphology. 3 mm LEFT lower pole nephrolithiasis. No
hydronephrosis; limited assessment for renal masses on this
nonenhanced examination. The unopacified ureters are normal in
course and caliber. Urinary bladder is decompressed and
unremarkable. Normal adrenal glands.

STOMACH/BOWEL: The stomach, small and large bowel are normal in
course and caliber without inflammatory changes, sensitivity
decreased by lack of enteric contrast. Mild descending and sigmoid
colonic diverticulosis. Normal appendix.

VASCULAR/LYMPHATIC: Aortoiliac vessels are normal in course and
caliber. Mild aortic atherosclerosis. No lymphadenopathy by CT size
criteria.

REPRODUCTIVE: Status post hysterectomy.

OTHER: No intraperitoneal free fluid or free air.

MUSCULOSKELETAL: Non-acute. Small fat containing supraumbilical
ventral hernia. Dextroscoliosis mid lumbar spine associated with
hemivertebra.
IMPRESSION: 1. 3 mm nonobstructing LEFT nephrolithiasis.
2. **An incidental finding of potential clinical significance has
been found. 6 mm LEFT lower lobe pulmonary nodule. Recommend a CT
chest at 6-12 months.**
Aortic Atherosclerosis ([id number]-G81.1).

## 2019-06-25 ENCOUNTER — Encounter: Payer: Self-pay | Admitting: Family Medicine

## 2019-06-25 ENCOUNTER — Other Ambulatory Visit: Payer: Self-pay | Admitting: Family Medicine

## 2019-06-25 NOTE — Telephone Encounter (Signed)
RF request for Celebrex LOV:04/07/19 Next ov: advised to f/u 3 months Last written:07/23/18(60,6)  RF request for Methocarbamol LOV:04/07/19 Next ov: advised to f/u 3 months Last written:06/29/18(30,5)  RF request for Citalopram LOV:04/07/19 Next ov: advised to f/u 3 months Last written:04/14/19(90,0)  Patient should have enough of citalopram until the end of this month. Medications pending, please advise.

## 2019-06-25 NOTE — Telephone Encounter (Signed)
Patient was given Rx for Tizanidine on 04/26/19(16,0). She is requesting refill from you for this. Please advise

## 2019-06-27 MED ORDER — TIZANIDINE HCL 4 MG PO TABS: 4.0000 mg | ORAL_TABLET | Freq: Four times a day (QID) | ORAL | 0 refills | Status: DC | PRN

## 2019-06-27 NOTE — Telephone Encounter (Signed)
OK, tizanidine sent to [facility]

## 2019-07-02 ENCOUNTER — Ambulatory Visit: Payer: 59

## 2019-07-03 ENCOUNTER — Other Ambulatory Visit: Payer: Self-pay

## 2019-07-03 ENCOUNTER — Ambulatory Visit
Admission: RE | Admit: 2019-07-03 | Discharge: 2019-07-03 | Disposition: A | Discharge status: Home / Self Care | Payer: 59 | Source: Ambulatory Visit | Attending: Family Medicine | Admitting: Family Medicine

## 2019-07-03 DIAGNOSIS — Z1231 Encounter for screening mammogram for malignant neoplasm of breast: Secondary | ICD-10-CM

## 2019-07-12 ENCOUNTER — Encounter: Payer: Self-pay | Admitting: Family Medicine

## 2019-07-15 ENCOUNTER — Other Ambulatory Visit: Payer: Self-pay | Admitting: Family Medicine

## 2019-07-15 NOTE — Telephone Encounter (Signed)
Mychart message sent advising patient to schedule f/u appt. Last seen 04/07/19 and due to f/u 3 months, June.

## 2019-07-16 NOTE — Telephone Encounter (Signed)
Requesting: Hydrocodone Contract:10/04/18 UDS: 10/04/18 Last Visit:04/07/19 Next Visit: advised to f/u 3 mo. Last Refill:06/11/19(90,0)  Please Advise. Medication pending. Patient is aware she is due for appt and stated she would call office to schedule soon.

## 2019-07-17 NOTE — Telephone Encounter (Signed)
Patient scheduled appt for 7/13 which is first available. Can we please approve refill of meds?  Thanks. Patient thought she was due for her annual physical, I told her she was due in Sept. She will call back to reschedule. Her husband as changed jobs, however, they will stay with [facility], just with another company and another plan.    Please call patient tomorrow morning. [phone]

## 2019-07-26 ENCOUNTER — Other Ambulatory Visit: Payer: Self-pay | Admitting: Family Medicine

## 2019-07-28 ENCOUNTER — Other Ambulatory Visit: Payer: Self-pay

## 2019-07-29 ENCOUNTER — Telehealth (INDEPENDENT_AMBULATORY_CARE_PROVIDER_SITE_OTHER): Payer: 59 | Admitting: Family Medicine

## 2019-07-29 ENCOUNTER — Encounter: Payer: Self-pay | Admitting: Family Medicine

## 2019-07-29 ENCOUNTER — Other Ambulatory Visit: Payer: Self-pay

## 2019-07-29 VITALS — Wt 155.0 lb

## 2019-07-29 DIAGNOSIS — G894 Chronic pain syndrome: Secondary | ICD-10-CM

## 2019-07-29 DIAGNOSIS — M5440 Lumbago with sciatica, unspecified side: Secondary | ICD-10-CM

## 2019-07-29 DIAGNOSIS — G8929 Other chronic pain: Secondary | ICD-10-CM

## 2019-07-29 DIAGNOSIS — M7918 Myalgia, other site: Secondary | ICD-10-CM

## 2019-07-29 DIAGNOSIS — F411 Generalized anxiety disorder: Secondary | ICD-10-CM

## 2019-07-29 MED ORDER — HYDROCODONE-ACETAMINOPHEN 5-325 MG PO TABS: ORAL_TABLET | ORAL | 0 refills | Status: DC

## 2019-07-29 NOTE — Progress Notes (Signed)
Virtual Visit via Video Note  I connected with pt on 07/29/19 at  8:30 AM EDT by a video enabled telemedicine application and verified that I am speaking with the correct person using two identifiers.  Location patient: home Location provider:work or home office Persons participating in the virtual visit: patient, provider  I discussed the limitations of evaluation and management by telemedicine and the availability of in person appointments. The patient expressed understanding and agreed to proceed.  Telemedicine visit is a necessity given the COVID-19 restrictions in place at the current time.  HPI: 59 y/o WF being seen today for f/u chronic pain syndrome, GAD. A/P as of last visit: " 1) Elevated bp w/out dx HTN. Given her hx of diast dysfxn on echo, she very well could have had HTN for some time now. Given her overall situation at this time, though, we held off on starting med and stressed the importance of low Na, low sugar and caffeine intake, and try to take some daytime breaks for stress relief. Call back or return if bp consistently >145 syst of >95 diast or if otherwise worried.  2) Chronic pain syndrome-->widespread myofascial pain, chronic neck/thoracic/and lumbar back pain. The current medical regimen is effective (vicodin 5/325, #90 per month);  continue present plan and medications. No new rx's were needed for this condition today. CSC UTD.  3) Chronic anxiety: worsened since poor psychosocial situation last few months but hanging in there.  Continue on prn alpraz and daily citalopram.  No new alpraz rx needed today."  INTERIM HX: Still taking care of her parents in law, struggling with this. Husband had to change jobs to start helping out as well.  Has been seeing Dr. Nelva Bush more lately for her lumbar radiculopathy. ESI no help.  Nerve block did help.  GAD: coping fairly well on citalopram and xanax. BP: no home monitoring to report.   Indication for chronic  opioid:neck, mid back, low back, HA's, myofascial pain, myalgias of appendicular skeleton. Some days finds it hard to get out of bed and function.  She carries dx of DDD of C and L spine, significant scoliosis, myofascial pain syndrome with trigger points and recurrent trochanteric bursitis. Has seen a handful of specialists in the past (spine and scoliosis center in [place], [facility], neurologist, in [place], also HA and wellness center in [place]). She has never been managed by a pain mgmt specialist. Medication and dose:vicodin 5/325, 1 tid prn # pills per month: 90. PMP AWARE reviewed today: most recent rx for vicodin was filled 07/17/19, # 67, rx by me. Most recent rx for alprazolam was filled 07/15/19, # 75, rx by me. No red flags.  She takes anywhere from 1-3 pain pills per day. She is trying limit use of these. No insurance so mgmt by Dr. Nelva Bush is going to stop for a while.  She is still struggling but seems to be coping well enough on current meds: citalopram and xanax.  Tizanidine helps better than methocarbamol so we've d/c'd methocarbamol.  ROS: See pertinent positives and negatives per HPI.  Past Medical History:  Diagnosis Date  . Adult ADHD   . Anxiety   . Aortic atherosclerosis ([place]) 08/2016  . Chronic back pain    neck, too.  MRI L spine 05/2008: 40 degrees dextroscoliosis due to a hemivertebra of L3.  Some DDD/spondylosis w/out spinal stenosis.  . Depression   . Diastolic dysfunction, left ventricle 05/2018   Echo with grd I DD.  Marland Kitchen Gout  2 attacks total as of 12/2014.  Marland Kitchen Kidney stones 2005; 2017   Most recent CT 08/2016 showed <2 mm nonobstructing nephrolithiasis.  . Migraine    Proph tx with topamax helps, abortive tx with relpax helps.  Couldn't afford topamax so d/c'd this 04/2016.    . Navicular fracture, foot 03/2018   Right Navicular fx-nonunion (surgery)  . Pulmonary nodule 08/2016   6 mm LLL pulm nodule---repeat CT noncontrast recommended 6-12 mo.  Marland Kitchen Restless  legs syndrome    Pt can get to sleep fine so she declines med for this as of 08/2016    Past Surgical History:  Procedure Laterality Date  . BREAST SURGERY  1999   reduction  . ENDOMETRIAL ABLATION  2002  . FOOT FRACTURE SURGERY  03/2018   Closed displaced fracture of right navicular, nonunion.  Marland Kitchen KIDNEY STONE SURGERY  2005  . LAPAROSCOPIC VAGINAL HYSTERECTOMY  12/02/07   for DUB  . REDUCTION MAMMAPLASTY    . TRANSTHORACIC ECHOCARDIOGRAM  06/13/2018   Grd I DD    Family History  Problem Relation Age of Onset  . Cancer Mother        brain ca (glioblastoma)  . Breast cancer Paternal Aunt   . Breast cancer Maternal Grandmother      Current Outpatient Medications:  .  ALPRAZolam (XANAX) 1 MG tablet, TAKE ONE TABLET BY MOUTH TWICE A DAY AS NEEDED FOR ANXIETY, Disp: 180 tablet, Rfl: 1 .  Biotin 10000 MCG TABS, Take 1 tablet by mouth daily., Disp: , Rfl:  .  celecoxib (CELEBREX) 200 MG capsule, TAKE ONE CAPSULE BY MOUTH TWICE A DAY (EVERY 12 HOURS) AS NEEDED, Disp: 60 capsule, Rfl: 6 .  Cholecalciferol (VITAMIN D3) 5000 units CAPS, Take 1 capsule by mouth daily., Disp: , Rfl:  .  citalopram (CELEXA) 40 MG tablet, TAKE ONE (1) TABLET BY MOUTH EVERY DAY, Disp: 90 tablet, Rfl: 0 .  Cyanocobalamin (VITAMIN B-12 PO), Take 1 tablet by mouth daily. Reported on 04/07/2015, Disp: , Rfl:  .  eletriptan (RELPAX) 40 MG tablet, TAKE 1 TABLET BY MOUTH EVERY DAY AS NEEDED MAY REPEAT IN 2 HOURS IF NOT IMPROVED, Disp: 27 tablet, Rfl: 0 .  fluorouracil (EFUDEX) 5 % cream, Apply to areas of sun damage on the face twice daily for 4-5 days, Disp: , Rfl:  .  HYDROcodone-acetaminophen (NORCO/VICODIN) 5-325 MG tablet, TAKE ONE (1) TABLET BY MOUTH 3 TIMES DAILY AS NEEDED, Disp: 90 tablet, Rfl: 0 .  lidocaine (LIDODERM) 5 %, 1 patch to the affected area q12h prn, Disp: 30 patch, Rfl: 11 .  methocarbamol (ROBAXIN) 500 MG tablet, TAKE 1 OR 2 TABLETS BY MOUTH EVERY 6 HOURS AS NEEDED FOR MUSCULAR RELAXATION, Disp:  30 tablet, Rfl: 6 .  pantoprazole (PROTONIX) 40 MG tablet, TAKE ONE (1) TABLET BY MOUTH EVERY DAY, Disp: 90 tablet, Rfl: 3 .  tiZANidine (ZANAFLEX) 4 MG tablet, Take 1 tablet (4 mg total) by mouth every 6 (six) hours as needed for muscle spasms., Disp: 30 tablet, Rfl: 0 .  albuterol (VENTOLIN HFA) 108 (90 Base) MCG/ACT inhaler, Inhale 2 puffs into the lungs every 4 (four) hours as needed for wheezing or shortness of breath. (Patient not taking: Reported on 04/07/2019), Disp: 18 g, Rfl: 0 .  colchicine 0.6 MG tablet, 2 tabs po at onset of gout pain, then 1 tab po one hour later (Patient not taking: Reported on 04/22/2018), Disp: 15 tablet, Rfl: 2 .  tretinoin (RETIN-A) 0.025 % cream, Apply 1  application topically at bedtime as needed. For acne prevention/control (Patient not taking: Reported on 07/29/2019), Disp: 45 g, Rfl: 3  EXAM:  VITALS per patient if applicable: Wt 155 lb (70.3 kg)   BMI 26.61 kg/m    GENERAL: alert, oriented, appears well and in no acute distress  HEENT: atraumatic, conjunttiva clear, no obvious abnormalities on inspection of external nose and ears  NECK: normal movements of the head and neck  LUNGS: on inspection no signs of respiratory distress, breathing rate appears normal, no obvious gross SOB, gasping or wheezing  CV: no obvious cyanosis  MS: moves all visible extremities without noticeable abnormality  PSYCH/NEURO: pleasant and cooperative, no obvious depression or anxiety, speech and thought processing grossly intact  LABS: none today  Lab Results  Component Value Date   TSH 0.61 10/04/2018   Lab Results  Component Value Date   WBC 4.1 10/04/2018   HGB 13.7 10/04/2018   HCT 40.4 10/04/2018   MCV 86.0 10/04/2018   PLT 188.0 10/04/2018   Lab Results  Component Value Date   CREATININE 0.75 10/04/2018   BUN 9 10/04/2018   NA 141 10/04/2018   K 4.2 10/04/2018   CL 107 10/04/2018   CO2 26 10/04/2018   Lab Results  Component Value Date   ALT 9  10/04/2018   AST 16 10/04/2018   ALKPHOS 85 10/04/2018   BILITOT 0.6 10/04/2018   Lab Results  Component Value Date   CHOL 192 10/04/2018   Lab Results  Component Value Date   HDL 41.00 10/04/2018   Lab Results  Component Value Date   LDLCALC 126 (H) 10/04/2018   Lab Results  Component Value Date   TRIG 127.0 10/04/2018   Lab Results  Component Value Date   CHOLHDL 5 10/04/2018    ASSESSMENT AND PLAN:  Discussed the following assessment and plan:  1) GAD: coping ok despite difficult life circumstances lately. No change in meds today. No new rx for alprazolam was needed today.  2) Chronic pain syndrome: myofascial pain syndrome + chronic LBP with radiculopathy---seeing Dr. Nelva Bush but insurance issues will prevent this f/u for a while. I did electronic rx's for vicodin 5/325, #90 today for August and Sept.  Appropriate fill on/after date was noted on each rx. CSC and UDS UTD. We'll keep her on prn zanaflex as well. Celebrex as well.   -we discussed possible serious and likely etiologies, options for evaluation and workup, limitations of telemedicine visit vs in person visit, treatment, treatment risks and precautions. Pt prefers to treat via telemedicine empirically rather then risking or undertaking an in person visit at this moment. Patient agrees to seek prompt in person care if worsening, new symptoms arise, or if is not improving with treatment.   I discussed the assessment and treatment plan with the patient. The patient was provided an opportunity to ask questions and all were answered. The patient agreed with the plan and demonstrated an understanding of the instructions.   The patient was advised to call back or seek an in-person evaluation if the symptoms worsen or if the condition fails to improve as anticipated.  F/u: 3 mo  Signed:  Crissie Sickles, MD           07/29/2019

## 2019-08-24 ENCOUNTER — Encounter: Payer: Self-pay | Admitting: Family Medicine

## 2019-09-01 ENCOUNTER — Other Ambulatory Visit: Payer: Self-pay | Admitting: Family Medicine

## 2019-10-02 ENCOUNTER — Ambulatory Visit: Payer: Self-pay | Admitting: Family Medicine

## 2019-10-08 ENCOUNTER — Encounter: Payer: Self-pay | Admitting: Family Medicine

## 2019-10-09 ENCOUNTER — Encounter: Payer: Self-pay | Admitting: Family Medicine

## 2019-10-09 NOTE — Progress Notes (Deleted)
Office Note 10/09/2019  CC: No chief complaint on file.   HPI:  Jacqueline Vance is a 59 y.o. White female who is here for annual health maintenance exam, f/u chronic pain syndrome and chronic anx/dep--high risk med use.   Indication for chronic opioid:neck, mid back, low back, HA's, myofascial pain, myalgias of appendicular skeleton. Some days finds it hard to get out of bed and function.  She carries dx of DDD of C and L spine, significant scoliosis, myofascial pain syndrome with trigger points and recurrent trochanteric bursitis. Has seen a handful of specialists in the past (spine and scoliosis center in [place], [facility], neurologist, in [place], also HA and wellness center in [place]). She has never been managed by a pain mgmt specialist. Medication and dose:vicodin 5/325, 1 tid prn # pills per month: 90. PMP AWARE reviewed today: most recent rx for *** was filled ***, # ***, rx by ***. No red flags.  Also has chronic anx/dep, managed adequately by citalopram and alprazolam in the past w/out side effect. Most recent rx for alprazolam was filled ***, #***, rx by ***.   Past Medical History:  Diagnosis Date  . Adult ADHD   . Anxiety   . Aortic atherosclerosis ([place]) 08/2016  . Chronic back pain    neck, too.  MRI L spine 05/2008: 40 degrees dextroscoliosis due to a hemivertebra of L3.  Some DDD/spondylosis w/out spinal stenosis.  . Depression   . Diastolic dysfunction, left ventricle 05/2018   Echo with grd I DD.  Marland Kitchen Gout    2 attacks total as of 12/2014.  Marland Kitchen Kidney stones 2005; 2017   Most recent CT 08/2016 showed <2 mm nonobstructing nephrolithiasis.  . Migraine    Proph tx with topamax helps, abortive tx with relpax helps.  Couldn't afford topamax so d/c'd this 04/2016.    . Navicular fracture, foot 03/2018   Right Navicular fx-nonunion (surgery)  . Pulmonary nodule 08/2016   6 mm LLL pulm nodule---repeat CT noncontrast recommended 6-12 mo.  Marland Kitchen Restless legs syndrome    Pt can  get to sleep fine so she declines med for this as of 08/2016    Past Surgical History:  Procedure Laterality Date  . BREAST SURGERY  1999   reduction  . ENDOMETRIAL ABLATION  2002  . FOOT FRACTURE SURGERY  03/2018   Closed displaced fracture of right navicular, nonunion.  Marland Kitchen KIDNEY STONE SURGERY  2005  . LAPAROSCOPIC VAGINAL HYSTERECTOMY  12/02/07   for DUB  . LUMBAR ESI  2021   no help ([place])  . REDUCTION MAMMAPLASTY    . TRANSTHORACIC ECHOCARDIOGRAM  06/13/2018   Grd I DD    Family History  Problem Relation Age of Onset  . Cancer Mother        brain ca (glioblastoma)  . Breast cancer Paternal Aunt   . Breast cancer Maternal Grandmother     Social History   Socioeconomic History  . Marital status: Married    Spouse name: Not on file  . Number of children: Not on file  . Years of education: Not on file  . Highest education level: Not on file  Occupational History  . Not on file  Tobacco Use  . Smoking status: Never Smoker  . Smokeless tobacco: Never Used  Vaping Use  . Vaping Use: Never used  Substance and Sexual Activity  . Alcohol use: No  . Drug use: No  . Sexual activity: Not on file  Other Topics Concern  .  Not on file  Social History Narrative   Married, one adult son.  Lives in [place], [place].   Works for [employer] in [place].    Exercise: walks 3 miles per day.   No T/A/Ds.               Social Determinants of Health   Financial Resource Strain:   . Difficulty of Paying Living Expenses: Not on file  Food Insecurity:   . Worried About Charity fundraiser in the Last Year: Not on file  . Ran Out of Food in the Last Year: Not on file  Transportation Needs:   . Lack of Transportation (Medical): Not on file  . Lack of Transportation (Non-Medical): Not on file  Physical Activity:   . Days of Exercise per Week: Not on file  . Minutes of Exercise per Session: Not on file  Stress:   . Feeling of Stress : Not on file   Social Connections:   . Frequency of Communication with Friends and Family: Not on file  . Frequency of Social Gatherings with Friends and Family: Not on file  . Attends Religious Services: Not on file  . Active Member of Clubs or Organizations: Not on file  . Attends Archivist Meetings: Not on file  . Marital Status: Not on file  Intimate Partner Violence:   . Fear of Current or Ex-Partner: Not on file  . Emotionally Abused: Not on file  . Physically Abused: Not on file  . Sexually Abused: Not on file    Outpatient Medications Prior to Visit  Medication Sig Dispense Refill  . albuterol (VENTOLIN HFA) 108 (90 Base) MCG/ACT inhaler Inhale 2 puffs into the lungs every 4 (four) hours as needed for wheezing or shortness of breath. (Patient not taking: Reported on 04/07/2019) 18 g 0  . ALPRAZolam (XANAX) 1 MG tablet TAKE ONE TABLET BY MOUTH TWICE A DAY AS NEEDED FOR ANXIETY 180 tablet 1  . Biotin 10000 MCG TABS Take 1 tablet by mouth daily.    . celecoxib (CELEBREX) 200 MG capsule TAKE ONE CAPSULE BY MOUTH TWICE A DAY (EVERY 12 HOURS) AS NEEDED 60 capsule 6  . Cholecalciferol (VITAMIN D3) 5000 units CAPS Take 1 capsule by mouth daily.    . citalopram (CELEXA) 40 MG tablet TAKE ONE (1) TABLET BY MOUTH EVERY DAY 90 tablet 0  . colchicine 0.6 MG tablet 2 tabs po at onset of gout pain, then 1 tab po one hour later (Patient not taking: Reported on 04/22/2018) 15 tablet 2  . Cyanocobalamin (VITAMIN B-12 PO) Take 1 tablet by mouth daily. Reported on 04/07/2015    . eletriptan (RELPAX) 40 MG tablet TAKE 1 TABLET BY MOUTH EVERY DAY AS NEEDED MAY REPEAT IN 2 HOURS IF NOT IMPROVED 27 tablet 0  . fluorouracil (EFUDEX) 5 % cream Apply to areas of sun damage on the face twice daily for 4-5 days    . HYDROcodone-acetaminophen (NORCO/VICODIN) 5-325 MG tablet TAKE ONE (1) TABLET BY MOUTH 3 TIMES DAILY AS NEEDED 90 tablet 0  . lidocaine (LIDODERM) 5 % 1 patch to the affected area q12h prn 30 patch 11   . pantoprazole (PROTONIX) 40 MG tablet TAKE ONE (1) TABLET BY MOUTH EVERY DAY 90 tablet 3  . tiZANidine (ZANAFLEX) 4 MG tablet TAKE ONE TABLET (4MG  TOTAL) BY MOUTH EVERY SIX HOURS AS NEEDED FOR MUSCLE SPASMS 30 tablet 1  . tretinoin (RETIN-A) 0.025 % cream Apply 1 application topically at  bedtime as needed. For acne prevention/control (Patient not taking: Reported on 07/29/2019) 45 g 3   No facility-administered medications prior to visit.    Allergies  Allergen Reactions  . Imitrex [Sumatriptan] Other (See Comments)    Face and Arm numbness    ROS *** PE; Vitals with BMI 07/29/2019 04/26/2019 04/26/2019  Height - - -  Weight 155 lbs - -  BMI - - -  Systolic - 128 786  Diastolic - 94 96  Pulse - 60 58     *** Pertinent labs:  Lab Results  Component Value Date   TSH 0.61 10/04/2018   Lab Results  Component Value Date   WBC 4.1 10/04/2018   HGB 13.7 10/04/2018   HCT 40.4 10/04/2018   MCV 86.0 10/04/2018   PLT 188.0 10/04/2018   Lab Results  Component Value Date   CREATININE 0.75 10/04/2018   BUN 9 10/04/2018   NA 141 10/04/2018   K 4.2 10/04/2018   CL 107 10/04/2018   CO2 26 10/04/2018   Lab Results  Component Value Date   ALT 9 10/04/2018   AST 16 10/04/2018   ALKPHOS 85 10/04/2018   BILITOT 0.6 10/04/2018   Lab Results  Component Value Date   CHOL 192 10/04/2018   Lab Results  Component Value Date   HDL 41.00 10/04/2018   Lab Results  Component Value Date   LDLCALC 126 (H) 10/04/2018   Lab Results  Component Value Date   TRIG 127.0 10/04/2018   Lab Results  Component Value Date   CHOLHDL 5 10/04/2018    ASSESSMENT AND PLAN:   1) Chronic pain syndrome:  2) Chronic anx/dep:        Health maintenance exam: Reviewed age and gender appropriate health maintenance issues (prudent diet, regular exercise, health risks of tobacco and excessive alcohol, use of seatbelts, fire alarms in home, use of sunscreen).  Also reviewed age and gender  appropriate health screening as well as vaccine recommendations. Vaccines: Tdap UTD.  Flu->***.   Shingrix->***.  Covid 19->***. Labs: fasting HP, UDS. Cervical ca screening: hx of hysterectomy for benign dx-->GYN MD is Dr. Radene Knee. Breast ca screening: UTD 06/2019-nml.  Rpt 06/2020. Colon ca screening: has never hadj screening.  She is "average risk"-->***.   An After Visit Summary was printed and given to the patient.  FOLLOW UP:  No follow-ups on file.  Signed:  Crissie Sickles, MD           10/09/2019

## 2019-10-21 ENCOUNTER — Other Ambulatory Visit: Payer: Self-pay | Admitting: Family Medicine

## 2019-10-27 ENCOUNTER — Encounter: Payer: Self-pay | Admitting: Family Medicine

## 2019-10-28 ENCOUNTER — Encounter: Payer: Self-pay | Admitting: Family Medicine

## 2019-10-28 ENCOUNTER — Other Ambulatory Visit: Payer: Self-pay

## 2019-10-28 ENCOUNTER — Ambulatory Visit: Payer: Self-pay | Admitting: Family Medicine

## 2019-10-28 VITALS — BP 122/80 | HR 59 | Temp 97.6°F | Resp 16 | Ht 63.0 in | Wt 160.0 lb

## 2019-10-28 DIAGNOSIS — F411 Generalized anxiety disorder: Secondary | ICD-10-CM

## 2019-10-28 DIAGNOSIS — G894 Chronic pain syndrome: Secondary | ICD-10-CM | POA: Diagnosis not present

## 2019-10-28 DIAGNOSIS — R82998 Other abnormal findings in urine: Secondary | ICD-10-CM

## 2019-10-28 DIAGNOSIS — Z0000 Encounter for general adult medical examination without abnormal findings: Secondary | ICD-10-CM

## 2019-10-28 DIAGNOSIS — Z79899 Other long term (current) drug therapy: Secondary | ICD-10-CM | POA: Diagnosis not present

## 2019-10-28 DIAGNOSIS — R109 Unspecified abdominal pain: Secondary | ICD-10-CM

## 2019-10-28 DIAGNOSIS — Z23 Encounter for immunization: Secondary | ICD-10-CM

## 2019-10-28 LAB — POCT URINALYSIS DIPSTICK
Bilirubin, UA: NEGATIVE
Blood, UA: NEGATIVE
Glucose, UA: NEGATIVE
Ketones, UA: NEGATIVE
Leukocytes, UA: NEGATIVE
Nitrite, UA: NEGATIVE
Protein, UA: POSITIVE — AB
Spec Grav, UA: 1.015 (ref 1.010–1.025)
Urobilinogen, UA: 0.2 E.U./dL
pH, UA: 6.5 (ref 5.0–8.0)

## 2019-10-28 LAB — COMPREHENSIVE METABOLIC PANEL
ALT: 12 U/L (ref 0–35)
AST: 17 U/L (ref 0–37)
Albumin: 4.2 g/dL (ref 3.5–5.2)
Alkaline Phosphatase: 68 U/L (ref 39–117)
BUN: 10 mg/dL (ref 6–23)
CO2: 29 mEq/L (ref 19–32)
Calcium: 9.4 mg/dL (ref 8.4–10.5)
Chloride: 105 mEq/L (ref 96–112)
Creatinine, Ser: 0.84 mg/dL (ref 0.40–1.20)
GFR: 76.97 mL/min (ref >60.00)
Glucose, Bld: 86 mg/dL (ref 70–99)
Potassium: 4.3 mEq/L (ref 3.5–5.1)
Sodium: 140 mEq/L (ref 135–145)
Total Bilirubin: 0.6 mg/dL (ref 0.2–1.2)
Total Protein: 6.5 g/dL (ref 6.0–8.3)

## 2019-10-28 LAB — CBC WITH DIFFERENTIAL/PLATELET
Basophils Absolute: 0 10*3/uL (ref 0.0–0.1)
Basophils Relative: 0.3 % (ref 0.0–3.0)
Eosinophils Absolute: 0.1 10*3/uL (ref 0.0–0.7)
Eosinophils Relative: 2.6 % (ref 0.0–5.0)
HCT: 41.9 % (ref 36.0–46.0)
Hemoglobin: 14 g/dL (ref 12.0–15.0)
Lymphocytes Relative: 46.4 % — ABNORMAL HIGH (ref 12.0–46.0)
Lymphs Abs: 2 10*3/uL (ref 0.7–4.0)
MCHC: 33.4 g/dL (ref 30.0–36.0)
MCV: 88.1 fl (ref 78.0–100.0)
Monocytes Absolute: 0.3 10*3/uL (ref 0.1–1.0)
Monocytes Relative: 7.7 % (ref 3.0–12.0)
Neutro Abs: 1.9 10*3/uL (ref 1.4–7.7)
Neutrophils Relative %: 43 % (ref 43.0–77.0)
Platelets: 188 10*3/uL (ref 150.0–400.0)
RBC: 4.75 Mil/uL (ref 3.87–5.11)
RDW: 13 % (ref 11.5–15.5)
WBC: 4.3 10*3/uL (ref 4.0–10.5)

## 2019-10-28 LAB — LIPID PANEL
Cholesterol: 195 mg/dL (ref 0–200)
HDL: 44 mg/dL (ref >39.00)
LDL Cholesterol: 130 mg/dL — ABNORMAL HIGH (ref 0–99)
NonHDL: 151.05
Total CHOL/HDL Ratio: 4
Triglycerides: 105 mg/dL (ref 0.0–149.0)
VLDL: 21 mg/dL (ref 0.0–40.0)

## 2019-10-28 LAB — TSH: TSH: 1.11 u[IU]/mL (ref 0.35–4.50)

## 2019-10-28 NOTE — Patient Instructions (Signed)
Health Maintenance, Female Adopting a healthy lifestyle and getting preventive care are important in promoting health and wellness. Ask your health care provider about:  The right schedule for you to have regular tests and exams.  Things you can do on your own to prevent diseases and keep yourself healthy. What should I know about diet, weight, and exercise? Eat a healthy diet   Eat a diet that includes plenty of vegetables, fruits, low-fat dairy products, and lean protein.  Do not eat a lot of foods that are high in solid fats, added sugars, or sodium. Maintain a healthy weight Body mass index (BMI) is used to identify weight problems. It estimates body fat based on height and weight. Your health care provider can help determine your BMI and help you achieve or maintain a healthy weight. Get regular exercise Get regular exercise. This is one of the most important things you can do for your health. Most adults should:  Exercise for at least 150 minutes each week. The exercise should increase your heart rate and make you sweat (moderate-intensity exercise).  Do strengthening exercises at least twice a week. This is in addition to the moderate-intensity exercise.  Spend less time sitting. Even light physical activity can be beneficial. Watch cholesterol and blood lipids Have your blood tested for lipids and cholesterol at 57 years of age, then have this test every 5 years. Have your cholesterol levels checked more often if:  Your lipid or cholesterol levels are high.  You are older than 57 years of age.  You are at high risk for heart disease. What should I know about cancer screening? Depending on your health history and family history, you may need to have cancer screening at various ages. This may include screening for:  Breast cancer.  Cervical cancer.  Colorectal cancer.  Skin cancer.  Lung cancer. What should I know about heart disease, diabetes, and high blood  pressure? Blood pressure and heart disease  High blood pressure causes heart disease and increases the risk of stroke. This is more likely to develop in people who have high blood pressure readings, are of African descent, or are overweight.  Have your blood pressure checked: ? Every 3-5 years if you are 18-39 years of age. ? Every year if you are 40 years old or older. Diabetes Have regular diabetes screenings. This checks your fasting blood sugar level. Have the screening done:  Once every three years after age 40 if you are at a normal weight and have a low risk for diabetes.  More often and at a younger age if you are overweight or have a high risk for diabetes. What should I know about preventing infection? Hepatitis B If you have a higher risk for hepatitis B, you should be screened for this virus. Talk with your health care provider to find out if you are at risk for hepatitis B infection. Hepatitis C Testing is recommended for:  Everyone born from 1945 through 1965.  Anyone with known risk factors for hepatitis C. Sexually transmitted infections (STIs)  Get screened for STIs, including gonorrhea and chlamydia, if: ? You are sexually active and are younger than 57 years of age. ? You are older than 57 years of age and your health care provider tells you that you are at risk for this type of infection. ? Your sexual activity has changed since you were last screened, and you are at increased risk for chlamydia or gonorrhea. Ask your health care provider if   you are at risk.  Ask your health care provider about whether you are at high risk for HIV. Your health care provider may recommend a prescription medicine to help prevent HIV infection. If you choose to take medicine to prevent HIV, you should first get tested for HIV. You should then be tested every 3 months for as long as you are taking the medicine. Pregnancy  If you are about to stop having your period (premenopausal) and  you may become pregnant, seek counseling before you get pregnant.  Take 400 to 800 micrograms (mcg) of folic acid every day if you become pregnant.  Ask for birth control (contraception) if you want to prevent pregnancy. Osteoporosis and menopause Osteoporosis is a disease in which the bones lose minerals and strength with aging. This can result in bone fractures. If you are 65 years old or older, or if you are at risk for osteoporosis and fractures, ask your health care provider if you should:  Be screened for bone loss.  Take a calcium or vitamin D supplement to lower your risk of fractures.  Be given hormone replacement therapy (HRT) to treat symptoms of menopause. Follow these instructions at home: Lifestyle  Do not use any products that contain nicotine or tobacco, such as cigarettes, e-cigarettes, and chewing tobacco. If you need help quitting, ask your health care provider.  Do not use street drugs.  Do not share needles.  Ask your health care provider for help if you need support or information about quitting drugs. Alcohol use  Do not drink alcohol if: ? Your health care provider tells you not to drink. ? You are pregnant, may be pregnant, or are planning to become pregnant.  If you drink alcohol: ? Limit how much you use to 0-1 drink a day. ? Limit intake if you are breastfeeding.  Be aware of how much alcohol is in your drink. In the [place] one drink equals one 12 oz bottle of beer (355 mL), one 5 oz glass of wine (148 mL), or one 1 oz glass of hard liquor (44 mL). General instructions  Schedule regular health, dental, and eye exams.  Stay current with your vaccines.  Tell your health care provider if: ? You often feel depressed. ? You have ever been abused or do not feel safe at home. Summary  Adopting a healthy lifestyle and getting preventive care are important in promoting health and wellness.  Follow your health care provider's instructions about healthy  diet, exercising, and getting tested or screened for diseases.  Follow your health care provider's instructions on monitoring your cholesterol and blood pressure. This information is not intended to replace advice given to you by your health care provider. Make sure you discuss any questions you have with your health care provider. Document Revised: 12/26/2017 Document Reviewed: 12/26/2017 Elsevier Patient Education  2020 Elsevier Inc.  

## 2019-10-28 NOTE — Progress Notes (Signed)
Office Note 11/02/2019  CC:  Chief Complaint  Patient presents with  . Annual Exam    pt is fasting    HPI:  Jacqueline Vance is a 59 y.o. White female who is here for annual health maintenance exam and f/u chronic pain syndrome and chronic anxiety. At the time of her last CPE, A/P was: "Health maintenance exam: Reviewed age and gender appropriate health maintenance issues (prudent diet, regular exercise, health risks of tobacco and excessive alcohol, use of seatbelts, fire alarms in home, use of sunscreen).  Also reviewed age and gender appropriate health screening as well as vaccine recommendations. Vaccines: flu vaccine->given today.  Shingrix->check with [facility] in [place] about this. Labs: fasting HP.  Also UDS (high risk med use--see med list). Colon ca screening: she opts for cologuard. Breast ca screening: mammogram ordered/scheduled for 11/19/2018. Cerv ca screening: hx of hysterectomy for benign dx->no further paps indicated.  Hx of pulm nodule 2018, LLL (6 mm) : due for f/u noncontrast CT to monitor this-->ordered today.  High risk med use (see med list): we updated her CSC and obtained UDS today."  INTERIM HX: Notes 6 mo hx of R side of abd--near flank--constant ache/pressure, low intensity.  Nothing makes it better or worse.  No dysuria.  No unusual frequency or urgency.  No gross hematuria. Gets up 3-4 times per night.  No CVA region pain, no radiating side or groin pain.  She has still not gone to get the CT chest I ordered for f/u pulm nodule.  Indication for chronic opioid:neck, mid back, low back, HA's, myofascial pain, myalgias of appendicular skeleton. Some days finds it hard to get out of bed and function.  She carries dx of DDD of C and L spine, significant scoliosis, myofascial pain syndrome with trigger points and recurrent trochanteric bursitis. Has seen a handful of specialists in the past (spine and scoliosis center in [place], [facility], neurologist,  in [place], also [facility] center in [place]). She has never been managed by a pain mgmt specialist. Medication and dose:vicodin 5/325, 1 tid prn. PMP AWARE reviewed today: most recent rx for vicodin was filled 10/10/19, # 26, rx by me. Most recent fx for alpraz filled 09/01/19, #174, rx by me. No red flags.  She took her alpraz and her vicodin daily--UDS should show both today. Taking 1-3 pain pills per day.   Doing fine on alpraz bid scheduled.  Past Medical History:  Diagnosis Date  . Adult ADHD   . Anxiety   . Aortic atherosclerosis ([place]) 08/2016  . Chronic back pain    neck, too.  MRI L spine 05/2008: 40 degrees dextroscoliosis due to a hemivertebra of L3.  Some DDD/spondylosis w/out spinal stenosis.  . Depression   . Diastolic dysfunction, left ventricle 05/2018   Echo with grd I DD.  Marland Kitchen Gout    2 attacks total as of 12/2014.  Marland Kitchen Kidney stones 2005; 2017   Most recent CT 08/2016 showed <2 mm nonobstructing nephrolithiasis.  . Migraine    Proph tx with topamax helps, abortive tx with relpax helps.  Couldn't afford topamax so d/c'd this 04/2016.    . Navicular fracture, foot 03/2018   Right Navicular fx-nonunion (surgery)  . Pulmonary nodule 08/2016   6 mm LLL pulm nodule---repeat CT noncontrast recommended 6-12 mo.  Marland Kitchen Restless legs syndrome    Pt can get to sleep fine so she declines med for this as of 08/2016    Past Surgical History:  Procedure  Laterality Date  . BREAST SURGERY  1999   reduction  . ENDOMETRIAL ABLATION  2002  . FOOT FRACTURE SURGERY  03/2018   Closed displaced fracture of right navicular, nonunion.  Marland Kitchen KIDNEY STONE SURGERY  2005  . LAPAROSCOPIC VAGINAL HYSTERECTOMY  12/02/07   for DUB  . LUMBAR ESI  2021   no help ([place])  . REDUCTION MAMMAPLASTY    . TRANSTHORACIC ECHOCARDIOGRAM  06/13/2018   Grd I DD    Family History  Problem Relation Age of Onset  . Cancer Mother        brain ca (glioblastoma)  . Breast cancer Paternal Aunt   . Breast  cancer Maternal Grandmother     Social History   Socioeconomic History  . Marital status: Married    Spouse name: Not on file  . Number of children: Not on file  . Years of education: Not on file  . Highest education level: Not on file  Occupational History  . Not on file  Tobacco Use  . Smoking status: Never Smoker  . Smokeless tobacco: Never Used  Vaping Use  . Vaping Use: Never used  Substance and Sexual Activity  . Alcohol use: No  . Drug use: No  . Sexual activity: Not on file  Other Topics Concern  . Not on file  Social History Narrative   Married, one adult son.  Lives in [place], [place].   Works for [employer] in [place].    Exercise: walks 3 miles per day.   No T/A/Ds.               Social Determinants of Health   Financial Resource Strain:   . Difficulty of Paying Living Expenses: Not on file  Food Insecurity:   . Worried About Charity fundraiser in the Last Year: Not on file  . Ran Out of Food in the Last Year: Not on file  Transportation Needs:   . Lack of Transportation (Medical): Not on file  . Lack of Transportation (Non-Medical): Not on file  Physical Activity:   . Days of Exercise per Week: Not on file  . Minutes of Exercise per Session: Not on file  Stress:   . Feeling of Stress : Not on file  Social Connections:   . Frequency of Communication with Friends and Family: Not on file  . Frequency of Social Gatherings with Friends and Family: Not on file  . Attends Religious Services: Not on file  . Active Member of Clubs or Organizations: Not on file  . Attends Archivist Meetings: Not on file  . Marital Status: Not on file  Intimate Partner Violence:   . Fear of Current or Ex-Partner: Not on file  . Emotionally Abused: Not on file  . Physically Abused: Not on file  . Sexually Abused: Not on file    Outpatient Medications Prior to Visit  Medication Sig Dispense Refill  . ALPRAZolam (XANAX) 1 MG tablet TAKE  ONE TABLET BY MOUTH TWICE A DAY AS NEEDED FOR ANXIETY 180 tablet 1  . amoxicillin (AMOXIL) 875 MG tablet Take 875 mg by mouth 2 (two) times daily.    . Biotin 10000 MCG TABS Take 1 tablet by mouth daily.    . celecoxib (CELEBREX) 200 MG capsule TAKE ONE CAPSULE BY MOUTH TWICE A DAY (EVERY 12 HOURS) AS NEEDED 60 capsule 6  . Cholecalciferol (VITAMIN D3) 5000 units CAPS Take 1 capsule by mouth daily.    . citalopram (  CELEXA) 40 MG tablet TAKE ONE (1) TABLET BY MOUTH EVERY DAY 90 tablet 0  . Cyanocobalamin (VITAMIN B-12 PO) Take 1 tablet by mouth daily. Reported on 04/07/2015    . eletriptan (RELPAX) 40 MG tablet TAKE 1 TABLET BY MOUTH EVERY DAY AS NEEDED MAY REPEAT IN 2 HOURS IF NOT IMPROVED 27 tablet 0  . fluorouracil (EFUDEX) 5 % cream Apply to areas of sun damage on the face twice daily for 4-5 days    . HYDROcodone-acetaminophen (NORCO/VICODIN) 5-325 MG tablet TAKE ONE (1) TABLET BY MOUTH 3 TIMES DAILY AS NEEDED 90 tablet 0  . pantoprazole (PROTONIX) 40 MG tablet TAKE ONE (1) TABLET BY MOUTH EVERY DAY 90 tablet 3  . tiZANidine (ZANAFLEX) 4 MG tablet TAKE ONE TABLET (4MG  TOTAL) BY MOUTH EVERY SIX HOURS AS NEEDED FOR MUSCLE SPASMS 30 tablet 1  . albuterol (VENTOLIN HFA) 108 (90 Base) MCG/ACT inhaler Inhale 2 puffs into the lungs every 4 (four) hours as needed for wheezing or shortness of breath. (Patient not taking: Reported on 04/07/2019) 18 g 0  . colchicine 0.6 MG tablet 2 tabs po at onset of gout pain, then 1 tab po one hour later (Patient not taking: Reported on 04/22/2018) 15 tablet 2  . lidocaine (LIDODERM) 5 % 1 patch to the affected area q12h prn (Patient not taking: Reported on 10/28/2019) 30 patch 11  . tretinoin (RETIN-A) 0.025 % cream Apply 1 application topically at bedtime as needed. For acne prevention/control (Patient not taking: Reported on 07/29/2019) 45 g 3   No facility-administered medications prior to visit.    Allergies  Allergen Reactions  . Imitrex [Sumatriptan] Other (See  Comments)    Face and Arm numbness    ROS Review of Systems  Constitutional: Negative for appetite change, chills, fatigue and fever.  HENT: Negative for congestion, dental problem, ear pain and sore throat.   Eyes: Negative for discharge, redness and visual disturbance.  Respiratory: Negative for cough, chest tightness, shortness of breath and wheezing.   Cardiovascular: Negative for chest pain, palpitations and leg swelling.  Gastrointestinal: Negative for abdominal pain, blood in stool, diarrhea, nausea and vomiting.  Genitourinary: Negative for difficulty urinating, dysuria, flank pain, frequency, hematuria and urgency.  Musculoskeletal: Positive for arthralgias (chronic, particularly her back), back pain and myalgias (intermittent, diffuse-fibromyalgia). Negative for joint swelling and neck stiffness.  Skin: Negative for pallor and rash.  Neurological: Negative for dizziness, speech difficulty, weakness and headaches.  Hematological: Negative for adenopathy. Does not bruise/bleed easily.  Psychiatric/Behavioral: Negative for confusion and sleep disturbance. The patient is not nervous/anxious.     PE; Vitals with BMI 10/28/2019 07/29/2019 04/26/2019  Height 5\' 3"  - -  Weight 160 lbs 155 lbs -  BMI 14.97 - -  Systolic 026 - 378  Diastolic 80 - 94  Pulse 59 - 60    Exam chaperoned by female CMA Gen: Alert, well appearing.  Patient is oriented to person, place, time, and situation. AFFECT: pleasant, lucid thought and speech. ENT: Ears: EACs clear, normal epithelium.  TMs with good light reflex and landmarks bilaterally.  Eyes: no injection, icteris, swelling, or exudate.  EOMI, PERRLA. Nose: no drainage or turbinate edema/swelling.  No injection or focal lesion.  Mouth: lips without lesion/swelling.  Oral mucosa pink and moist.  Dentition intact and without obvious caries or gingival swelling.  Oropharynx without erythema, exudate, or swelling.  Neck: supple/nontender.  No LAD,  mass, or TM.  Carotid pulses 2+ bilaterally, without bruits. CV: RRR, no  m/r/g.   LUNGS: CTA bilat, nonlabored resps, good aeration in all lung fields. ABD: soft, NT, ND, BS normal.  No hepatospenomegaly or mass.  No bruits. EXT: no clubbing, cyanosis, or edema.  Musculoskeletal: no joint swelling, erythema, warmth, or tenderness.  ROM of all joints intact. Skin - no sores or suspicious lesions or rashes or color changes   Pertinent labs:  Lab Results  Component Value Date   TSH 1.11 10/28/2019   Lab Results  Component Value Date   WBC 4.3 10/28/2019   HGB 14.0 10/28/2019   HCT 41.9 10/28/2019   MCV 88.1 10/28/2019   PLT 188.0 10/28/2019   Lab Results  Component Value Date   CREATININE 0.84 10/28/2019   BUN 10 10/28/2019   NA 140 10/28/2019   K 4.3 10/28/2019   CL 105 10/28/2019   CO2 29 10/28/2019   Lab Results  Component Value Date   ALT 12 10/28/2019   AST 17 10/28/2019   ALKPHOS 68 10/28/2019   BILITOT 0.6 10/28/2019   Lab Results  Component Value Date   CHOL 195 10/28/2019   Lab Results  Component Value Date   HDL 44.00 10/28/2019   Lab Results  Component Value Date   LDLCALC 130 (H) 10/28/2019   Lab Results  Component Value Date   TRIG 105.0 10/28/2019   Lab Results  Component Value Date   CHOLHDL 4 10/28/2019   POC CC dipstick UA today: normal except 1+ protein.  ASSESSMENT AND PLAN:   1) Chronic pain syndrome: stable. Continue vicodin 5/325, 1 tab tid prn. Continue zanaflex. No new rx's for either of these meds needed today. Renewed CSC today.  UDS today.  2) Chronic anxiety: continue citalopram 40mg  qd. Continue xanax 1mg , 1 bid prn. No new rx for this med needed today. CSC renewed, UDS done today.  3) R side pain, mild, X 5mo, urine odor/dark color: UA today normal. Exam benign. Obs.  4) Health maintenance exam: Reviewed age and gender appropriate health maintenance issues (prudent diet, regular exercise, health risks of tobacco  and excessive alcohol, use of seatbelts, fire alarms in home, use of sunscreen).  Also reviewed age and gender appropriate health screening as well as vaccine recommendations. Vaccines:  Flu->given today.  Tdap UTD.  She declines covid vaccine. Labs: fasting HP Cervical ca screening: hx of hysterectomy for benign dx-->no further pelvics/paps indicated. Breast ca screening: normal mammogram 07/03/19. Repeat 06/2020. Colon ca screening: Sept 2020 cologuard--she never did this. She will be doing this in the next day or so.  Renew CSC and do UDS today.  An After Visit Summary was printed and given to the patient.  FOLLOW UP:  Return in about 3 months (around 01/28/2020) for routine chronic illness f/u/chronic pain syndrome.  Signed:  Crissie Sickles, MD           11/02/2019

## 2019-10-31 LAB — DRUG MONITORING, PANEL 8 WITH CONFIRMATION, URINE
6 Acetylmorphine: NEGATIVE ng/mL (ref <10)
Alcohol Metabolites: NEGATIVE ng/mL
Alphahydroxyalprazolam: 452 ng/mL — ABNORMAL HIGH (ref <25)
Alphahydroxymidazolam: NEGATIVE ng/mL (ref <50)
Alphahydroxytriazolam: NEGATIVE ng/mL (ref <50)
Aminoclonazepam: NEGATIVE ng/mL (ref <25)
Amphetamines: NEGATIVE ng/mL (ref <500)
Benzodiazepines: POSITIVE ng/mL — AB (ref <100)
Buprenorphine, Urine: NEGATIVE ng/mL (ref <5)
Cocaine Metabolite: NEGATIVE ng/mL (ref <150)
Codeine: NEGATIVE ng/mL (ref <50)
Creatinine: 165.2 mg/dL
Hydrocodone: 710 ng/mL — ABNORMAL HIGH (ref <50)
Hydromorphone: 94 ng/mL — ABNORMAL HIGH (ref <50)
Hydroxyethylflurazepam: NEGATIVE ng/mL (ref <50)
Lorazepam: NEGATIVE ng/mL (ref <50)
MDMA: NEGATIVE ng/mL (ref <500)
Marijuana Metabolite: NEGATIVE ng/mL (ref <20)
Morphine: NEGATIVE ng/mL (ref <50)
Nordiazepam: NEGATIVE ng/mL (ref <50)
Norhydrocodone: 1872 ng/mL — ABNORMAL HIGH (ref <50)
Opiates: POSITIVE ng/mL — AB (ref <100)
Oxazepam: NEGATIVE ng/mL (ref <50)
Oxidant: NEGATIVE ug/mL
Oxycodone: NEGATIVE ng/mL (ref <100)
Temazepam: NEGATIVE ng/mL (ref <50)
pH: 6.8 (ref 4.5–9.0)

## 2019-11-03 ENCOUNTER — Other Ambulatory Visit: Payer: Self-pay | Admitting: Family Medicine

## 2019-11-10 ENCOUNTER — Other Ambulatory Visit: Payer: Self-pay | Admitting: Family Medicine

## 2019-11-11 NOTE — Telephone Encounter (Signed)
Patient made aware refill sent.  

## 2019-11-11 NOTE — Telephone Encounter (Signed)
Requesting: Norco Contract: 10/28/19 UDS:10/04/18 Last Visit:10/28/19 Next Visit: advised to f/u 3 months Last Refill:07/29/19(90,0)  Please Advise. Medication pending

## 2019-12-12 ENCOUNTER — Other Ambulatory Visit: Payer: Self-pay | Admitting: Family Medicine

## 2019-12-15 NOTE — Telephone Encounter (Signed)
Requesting: Hydrocodone & xanax Contract:10/28/19 UDS:10/28/19 Last Visit:10/12//21 Next Visit:n/a Last Refill: 11/11/19 (90,0)/ 05/13/19 (180,0)  Please Advise, medication pending

## 2020-01-14 ENCOUNTER — Other Ambulatory Visit: Payer: Self-pay | Admitting: Family Medicine

## 2020-01-14 NOTE — Telephone Encounter (Signed)
Will do RF for vicodin but pt needs f/u visit in 2-4 weeks for f/u of chronic pain.

## 2020-01-14 NOTE — Telephone Encounter (Signed)
Requesting:HYDROcodone-acetaminophen (NORCO/VICODIN) 5-325 MG tablet Contract:10/28/19 UDS:10/28/19 Last Visit:10/28/19 Next Visit:n/a Last Refill:12/15/19 (90,0)  Please Advise

## 2020-01-15 ENCOUNTER — Telehealth: Payer: Self-pay

## 2020-01-15 NOTE — Telephone Encounter (Signed)
pt needs f/u visit in 2-4 weeks for f/u of chronic pain.

## 2020-01-15 NOTE — Telephone Encounter (Signed)
pt needs f/u visit in 2-4 weeks for f/u of chronic pain. 

## 2020-01-27 ENCOUNTER — Encounter: Payer: Self-pay | Admitting: Family Medicine

## 2020-01-27 ENCOUNTER — Other Ambulatory Visit: Payer: Self-pay

## 2020-01-28 ENCOUNTER — Other Ambulatory Visit: Payer: Self-pay

## 2020-01-28 MED ORDER — ELETRIPTAN HYDROBROMIDE 40 MG PO TABS: ORAL_TABLET | ORAL | 6 refills | Status: DC

## 2020-01-28 NOTE — Telephone Encounter (Signed)
OK elitriptan erx'd as per pt request

## 2020-01-29 ENCOUNTER — Encounter: Payer: Self-pay | Admitting: Family Medicine

## 2020-01-29 ENCOUNTER — Ambulatory Visit (INDEPENDENT_AMBULATORY_CARE_PROVIDER_SITE_OTHER): Payer: PRIVATE HEALTH INSURANCE | Admitting: Family Medicine

## 2020-01-29 ENCOUNTER — Other Ambulatory Visit: Payer: Self-pay

## 2020-01-29 VITALS — BP 132/81 | HR 64 | Temp 97.4°F | Ht 63.0 in | Wt 164.0 lb

## 2020-01-29 DIAGNOSIS — Z1211 Encounter for screening for malignant neoplasm of colon: Secondary | ICD-10-CM

## 2020-01-29 DIAGNOSIS — G894 Chronic pain syndrome: Secondary | ICD-10-CM

## 2020-01-29 DIAGNOSIS — F339 Major depressive disorder, recurrent, unspecified: Secondary | ICD-10-CM

## 2020-01-29 DIAGNOSIS — G8929 Other chronic pain: Secondary | ICD-10-CM

## 2020-01-29 DIAGNOSIS — R911 Solitary pulmonary nodule: Secondary | ICD-10-CM | POA: Diagnosis not present

## 2020-01-29 DIAGNOSIS — F411 Generalized anxiety disorder: Secondary | ICD-10-CM

## 2020-01-29 DIAGNOSIS — M545 Low back pain, unspecified: Secondary | ICD-10-CM

## 2020-01-29 MED ORDER — HYDROCODONE-ACETAMINOPHEN 5-325 MG PO TABS: ORAL_TABLET | ORAL | 0 refills | Status: DC

## 2020-01-29 NOTE — Progress Notes (Signed)
OFFICE VISIT  01/29/2020  CC:  Chief Complaint  Patient presents with  . Pain    2-4 wks    HPI:    Patient is a 60 y.o. Caucasian female who presents for 3 mo f/u chronic pain syndrome and anxiety. A/P as of last visit: "1) Chronic pain syndrome: stable. Continue vicodin 5/325, 1 tab tid prn. Continue zanaflex. No new rx's for either of these meds needed today. Renewed CSC today.  UDS today.  2) Chronic anxiety: continue citalopram 40mg  qd. Continue xanax 1mg , 1 bid prn. No new rx for this med needed today. CSC renewed, UDS done today.  3) R side pain, mild, X 20mo, urine odor/dark color: UA today normal. Exam benign. Obs.  4) Health maintenance exam: Reviewed age and gender appropriate health maintenance issues (prudent diet, regular exercise, health risks of tobacco and excessive alcohol, use of seatbelts, fire alarms in home, use of sunscreen).  Also reviewed age and gender appropriate health screening as well as vaccine recommendations. Vaccines:  Flu->given today.  Tdap UTD.  She declines covid vaccine. Labs: fasting HP Cervical ca screening: hx of hysterectomy for benign dx-->no further pelvics/paps indicated. Breast ca screening: normal mammogram 07/03/19. Repeat 06/2020. Colon ca screening: Sept 2020 cologuard--she never did this. She will be doing this in the next day or so.  Renew CSC and do UDS today."  INTERIM HX: Doing fine. All labs excellent last visit, UDS with appropriate results.  Pain control good as usual on 3 tabs per day: lifting and bending throughout her day, on her feet helping take care of mother in law w/dementia all day. No adverse side effects. Indication for chronic opioid:neck, mid back, low back, HA's, myofascial pain, myalgias of appendicular skeleton. Some days finds it hard to get out of bed and function.  She carries dx of DDD of C and L spine, significant scoliosis, myofascial pain syndrome with trigger points and recurrent  trochanteric bursitis. Has seen a handful of specialists in the past (sp[facility] sc[facility] in [place], [facility], neurologist, in [place], also [facility] in [place]). She has never been managed by a pain mgmt specialist. Medication and dose:vicodin 5/325, 1 tid prn. PMP AWARE reviewed today: most recent rx for vicodin was filled 01/15/20, # 73, rx by me. No red flags.   Anxiety historically controlled adequately with citalopram 40mg  qd and alpraz 1mg  bid prn. Continues to do well on this.  The biggest thing she deals with that the med does not adequately cover is her anticipatory anxiety in night and morning, sleep is not good.  PMP AWARE reviewed today: most recent rx for alpraz was filled 12/15/19, # 180, rx by me.  Hx pulm nodule, 6 mm, LLL, incidentally noted on CT renal study in 2018.  Have been trying to do repeat CT to follow this up but pt rescheduled initially and then covid issue got in the way, now the order is not active anymore.  ROS: no fevers, no CP, no SOB, no wheezing, no cough, no dizziness, no HAs, no rashes, no melena/hematochezia.  No polyuria or polydipsia.  No myalgias or arthralgias.  No focal weakness, paresthesias, or tremors.  No acute vision or hearing abnormalities. No n/v/d or abd pain.  No palpitations.   No wt loss or night sweats.  Past Medical History:  Diagnosis Date  . Adult ADHD   . Anxiety   . Aortic atherosclerosis ([place]) 08/2016  . Chronic back pain    neck,  too.  MRI L spine 05/2008: 40 degrees dextroscoliosis due to a hemivertebra of L3.  Some DDD/spondylosis w/out spinal stenosis.  . Depression   . Diastolic dysfunction, left ventricle 05/2018   Echo with grd I DD.  Marland Kitchen Gout    2 attacks total as of 12/2014.  Marland Kitchen Kidney stones 2005; 2017   Most recent CT 08/2016 showed <2 mm nonobstructing nephrolithiasis.  . Migraine    Proph tx with topamax helps, abortive tx with relpax helps.  Couldn't afford topamax so d/c'd this 04/2016.    .  Navicular fracture, foot 03/2018   Right Navicular fx-nonunion (surgery)  . Pulmonary nodule 08/2016   6 mm LLL pulm nodule---repeat CT noncontrast recommended 6-12 mo.  Marland Kitchen Restless legs syndrome    Pt can get to sleep fine so she declines med for this as of 08/2016    Past Surgical History:  Procedure Laterality Date  . BREAST SURGERY  1999   reduction  . ENDOMETRIAL ABLATION  2002  . FOOT FRACTURE SURGERY  03/2018   Closed displaced fracture of right navicular, nonunion.  Marland Kitchen KIDNEY STONE SURGERY  2005  . LAPAROSCOPIC VAGINAL HYSTERECTOMY  12/02/07   for DUB  . LUMBAR ESI  2021   no help ([facility])  . REDUCTION MAMMAPLASTY    . TRANSTHORACIC ECHOCARDIOGRAM  06/13/2018   Grd I DD   Outpatient Medications Prior to Visit  Medication Sig Dispense Refill  . albuterol (VENTOLIN HFA) 108 (90 Base) MCG/ACT inhaler Inhale 2 puffs into the lungs every 4 (four) hours as needed for wheezing or shortness of breath. 18 g 0  . ALPRAZolam (XANAX) 1 MG tablet TAKE ONE TABLET BY MOUTH TWICE A DAY AS NEEDED FOR ANXIETY 180 tablet 1  . Biotin 10000 MCG TABS Take 1 tablet by mouth daily.    . celecoxib (CELEBREX) 200 MG capsule TAKE ONE CAPSULE BY MOUTH TWICE A DAY (EVERY 12 HOURS) AS NEEDED 60 capsule 6  . Cholecalciferol (VITAMIN D3) 5000 units CAPS Take 1 capsule by mouth daily.    . citalopram (CELEXA) 40 MG tablet TAKE ONE (1) TABLET BY MOUTH EVERY DAY 90 tablet 0  . colchicine 0.6 MG tablet 2 tabs po at onset of gout pain, then 1 tab po one hour later 15 tablet 2  . Cyanocobalamin (VITAMIN B-12 PO) Take 1 tablet by mouth daily. Reported on 04/07/2015    . eletriptan (RELPAX) 40 MG tablet 1 tab po at onset of migraine HA.  May repeat in 2 hours if headache persists or recurs. 6 tablet 6  . fluorouracil (EFUDEX) 5 % cream Apply to areas of sun damage on the face twice daily for 4-5 days    . lidocaine (LIDODERM) 5 % 1 patch to the affected area q12h prn 30 patch 11  . pantoprazole (PROTONIX) 40 MG  tablet TAKE ONE (1) TABLET BY MOUTH EVERY DAY 90 tablet 3  . tiZANidine (ZANAFLEX) 4 MG tablet TAKE ONE TABLET (4MG  TOTAL) BY MOUTH EVERY SIX HOURS AS NEEDED FOR MUSCLE SPASMS 30 tablet 1  . tretinoin (RETIN-A) 0.025 % cream Apply 1 application topically at bedtime as needed. For acne prevention/control 45 g 3  . amoxicillin (AMOXIL) 500 MG capsule Take 500 mg by mouth 3 (three) times daily.    Marland Kitchen HYDROcodone-acetaminophen (NORCO/VICODIN) 5-325 MG tablet TAKE ONE (1) TABLET BY MOUTH 3 TIMES DAILY AS NEEDED 90 tablet 0   No facility-administered medications prior to visit.    Allergies  Allergen Reactions  .  Imitrex [Sumatriptan] Other (See Comments)    Face and Arm numbness    ROS As per HPI  PE: Vitals with BMI 01/29/2020 10/28/2019 07/29/2019  Height 5\' 3"  5\' 3"  -  Weight 164 lbs 160 lbs 155 lbs  BMI 0000000 99991111 -  Systolic [id number] -  Diastolic 81 80 -  Pulse 64 59 -     Gen: Alert, well appearing.  Patient is oriented to person, place, time, and situation. AFFECT: pleasant, lucid thought and speech. No further exam today.  LABS:  Lab Results  Component Value Date   TSH 1.11 10/28/2019   Lab Results  Component Value Date   WBC 4.3 10/28/2019   HGB 14.0 10/28/2019   HCT 41.9 10/28/2019   MCV 88.1 10/28/2019   PLT 188.0 10/28/2019   Lab Results  Component Value Date   CREATININE 0.84 10/28/2019   BUN 10 10/28/2019   NA 140 10/28/2019   K 4.3 10/28/2019   CL 105 10/28/2019   CO2 29 10/28/2019   Lab Results  Component Value Date   ALT 12 10/28/2019   AST 17 10/28/2019   ALKPHOS 68 10/28/2019   BILITOT 0.6 10/28/2019   Lab Results  Component Value Date   CHOL 195 10/28/2019   Lab Results  Component Value Date   HDL 44.00 10/28/2019   Lab Results  Component Value Date   LDLCALC 130 (H) 10/28/2019   Lab Results  Component Value Date   TRIG 105.0 10/28/2019   Lab Results  Component Value Date   CHOLHDL 4 10/28/2019     IMPRESSION AND  PLAN:  1) Chronic pain syndrome: general spine arthritis pain, some hips pain/bursitis, some myofascial pain---all stable and well controlled with vicodin in reasonable amounts, pt uses this med responsibly and no adverse effects.   Continue vicodin 5/325, 1 tid prn, #90 today->approp fill on/after dates on rx's for each of the next 3 mo. CSC and UDS UTD.  2) Chronic anxiety: doing fine on citalopram 40mg  qd and xanax 1mg  tid. CSC UTD. No new rxs needed today.  3) Pulm nodule, LLL, 6 mm, 2018-->reordered f/u noncontrast chest CT for [facility] today.  An After Visit Summary was printed and given to the patient.  FOLLOW UP: Return in about 3 months (around 04/28/2020) for routine chronic illness f/u. Next CPE 10/2020  Signed:  Crissie Sickles, MD           01/29/2020

## 2020-02-05 ENCOUNTER — Encounter: Payer: Self-pay | Admitting: Family Medicine

## 2020-02-05 NOTE — Telephone Encounter (Signed)
Called patient and provided [facility] phone number for scheduling.

## 2020-03-02 ENCOUNTER — Other Ambulatory Visit: Payer: Self-pay

## 2020-03-02 ENCOUNTER — Ambulatory Visit (HOSPITAL_COMMUNITY)
Admission: RE | Admit: 2020-03-02 | Discharge: 2020-03-02 | Disposition: A | Discharge status: Home / Self Care | Payer: PRIVATE HEALTH INSURANCE | Source: Ambulatory Visit | Attending: Family Medicine | Admitting: Family Medicine

## 2020-03-02 DIAGNOSIS — R911 Solitary pulmonary nodule: Secondary | ICD-10-CM | POA: Diagnosis present

## 2020-03-04 ENCOUNTER — Encounter: Payer: Self-pay | Admitting: Family Medicine

## 2020-03-08 ENCOUNTER — Encounter: Payer: Self-pay | Admitting: Family Medicine

## 2020-03-08 NOTE — Progress Notes (Signed)
OFFICE VISIT  03/09/2020  CC:  Chief Complaint  Patient presents with  . Pain in sides    Occurring for 4-5 months, thinks she may have UTI or possible kidney stones. Has burning and odor but denies frequency and urgency more than normal. Bought AZO tabs and suppository 2 weeks ago but neither are helping.   HPI:    Patient is a 60 y.o. Caucasian female who presents for "pain in sides". Feeling months of constant pain in both sides, worse the more she stands up and is moving around.  No period of acute/severe pain such as renal colic.  Not affected by urinating.  Last couple weeks when urinating she feels urethral burning and has smells odor like bread when urinating.  No vag d/c.  No urinary urgency or frequency.    Tried AZO otc pills/suppos no help---has not taken azo in over a week. No fever, no n/v. No blood in urine. Very high level of stress/anxiety last few months, taking care of chronically ill mother in law, also has a son using drugs.  Most recent renal/stone imaging 2018->see PMH section below b/c it is currently UTD.  ROS: no fevers, no CP, no SOB, no wheezing, no cough, no dizziness, no rashes, no melena/hematochezia.  No polyuria or polydipsia.  No myalgias or arthralgias.  No focal weakness, paresthesias, or tremors.  No acute vision or hearing abnormalities. No abd pain.  No constip or diarrhea. No palpitations.     Past Medical History:  Diagnosis Date  . Adult ADHD   . Anxiety   . Aortic atherosclerosis ([place]) 08/2016  . Chronic back pain    neck, too.  MRI L spine 05/2008: 40 degrees dextroscoliosis due to a hemivertebra of L3.  Some DDD/spondylosis w/out spinal stenosis.  . Depression   . Diastolic dysfunction, left ventricle 05/2018   Echo with grd I DD.  Marland Kitchen Gout    2 attacks total as of 12/2014.  Marland Kitchen Kidney stones 2005; 2017; 2018   Most recent CT 08/2016 showed 3 mm nonobstructing L nephrolithiasis.  . Migraine    Proph tx with topamax helps, abortive tx  with relpax helps.  Couldn't afford topamax so d/c'd this 04/2016.    . Navicular fracture, foot 03/2018   Right Navicular fx-nonunion (surgery)  . Pulmonary nodule 2018   5 mm LLL, stable on 2 yr f/u imaging 2022-->benign, no further imaging needed.  Marland Kitchen Restless legs syndrome    Pt can get to sleep fine so she declines med for this as of 08/2016    Past Surgical History:  Procedure Laterality Date  . BREAST SURGERY  1999   reduction  . ENDOMETRIAL ABLATION  2002  . FOOT FRACTURE SURGERY  03/2018   Closed displaced fracture of right navicular, nonunion.  Marland Kitchen KIDNEY STONE SURGERY  2005  . LAPAROSCOPIC VAGINAL HYSTERECTOMY  12/02/07   for DUB  . LUMBAR ESI  2021   no help ([facility])  . REDUCTION MAMMAPLASTY    . TRANSTHORACIC ECHOCARDIOGRAM  06/13/2018   Grd I DD    Outpatient Medications Prior to Visit  Medication Sig Dispense Refill  . ALPRAZolam (XANAX) 1 MG tablet TAKE ONE TABLET BY MOUTH TWICE A DAY AS NEEDED FOR ANXIETY 180 tablet 1  . Biotin 10000 MCG TABS Take 1 tablet by mouth daily.    . celecoxib (CELEBREX) 200 MG capsule TAKE ONE CAPSULE BY MOUTH TWICE A DAY (EVERY 12 HOURS) AS NEEDED 60 capsule 6  . Cholecalciferol (VITAMIN  D3) 5000 units CAPS Take 1 capsule by mouth daily.    . citalopram (CELEXA) 40 MG tablet TAKE ONE (1) TABLET BY MOUTH EVERY DAY 90 tablet 0  . colchicine 0.6 MG tablet 2 tabs po at onset of gout pain, then 1 tab po one hour later 15 tablet 2  . Cyanocobalamin (VITAMIN B-12 PO) Take 1 tablet by mouth daily. Reported on 04/07/2015    . eletriptan (RELPAX) 40 MG tablet 1 tab po at onset of migraine HA.  May repeat in 2 hours if headache persists or recurs. 6 tablet 6  . fluorouracil (EFUDEX) 5 % cream Apply to areas of sun damage on the face twice daily for 4-5 days    . HYDROcodone-acetaminophen (NORCO/VICODIN) 5-325 MG tablet 1 tab po tid prn pain 90 tablet 0  . lidocaine (LIDODERM) 5 % 1 patch to the affected area q12h prn 30 patch 11  . pantoprazole  (PROTONIX) 40 MG tablet TAKE ONE (1) TABLET BY MOUTH EVERY DAY 90 tablet 3  . tiZANidine (ZANAFLEX) 4 MG tablet TAKE ONE TABLET (4MG  TOTAL) BY MOUTH EVERY SIX HOURS AS NEEDED FOR MUSCLE SPASMS 30 tablet 1  . tretinoin (RETIN-A) 0.025 % cream Apply 1 application topically at bedtime as needed. For acne prevention/control 45 g 3  . albuterol (VENTOLIN HFA) 108 (90 Base) MCG/ACT inhaler Inhale 2 puffs into the lungs every 4 (four) hours as needed for wheezing or shortness of breath. (Patient not taking: Reported on 03/09/2020) 18 g 0   No facility-administered medications prior to visit.    Allergies  Allergen Reactions  . Imitrex [Sumatriptan] Other (See Comments)    Face and Arm numbness    ROS As per HPI  PE: Vitals with BMI 03/09/2020 01/29/2020 10/28/2019  Height 5\' 3"  5\' 3"  5\' 3"   Weight 158 lbs 164 lbs 160 lbs  BMI 28 24.23 53.61  Systolic 443 154 008  Diastolic 70 81 80  Pulse 71 64 59    Exam chaperoned by Deveron Furlong, CMA.  Gen: Alert, well appearing.  Patient is oriented to person, place, time, and situation. AFFECT: pleasant, lucid thought and speech. CV: RRR, soft syst ejection murmur, no r/g.   LUNGS: CTA bilat, nonlabored resps, good aeration in all lung fields. Diffuse mild TTP on both sides and all throughout abdomen. No guarding or rebound. No bruit, no mass, no hsm. No CVA tenderness.  LABS:    Chemistry      Component Value Date/Time   NA 140 10/28/2019 1151   K 4.3 10/28/2019 1151   CL 105 10/28/2019 1151   CO2 29 10/28/2019 1151   BUN 10 10/28/2019 1151   CREATININE 0.84 10/28/2019 1151      Component Value Date/Time   CALCIUM 9.4 10/28/2019 1151   ALKPHOS 68 10/28/2019 1151   AST 17 10/28/2019 1151   ALT 12 10/28/2019 1151   BILITOT 0.6 10/28/2019 1151     UA today showed + protein, otherwise normal.  IMPRESSION AND PLAN:  1) Musculoskeletal side pain (bilat), diffuse abd wall pain---->manifestations of her  emotional/psychological/physical stress. Reassured pt that I don't think she has kidney stone problem or kidney infection. Spent time today listening to her vent, giving emotional support and encouragement.  2) yeast vaginitis: diflucan rx'd. Urine sent for c/s. Printed rx for bactrim ds, 1 bid x 5d-->handed to pt today and told to hold. If urine happens to show bacterial growth and if dysuria doesn't resolve with diflucan then I'll have her  fill bactrim rx.  Spent 32 min with pt today reviewing HPI, reviewing relevant past history, doing exam, reviewing and discussing lab and imaging data, and formulating plans.  An After Visit Summary was printed and given to the patient.  FOLLOW UP: No follow-ups on file.  Signed:  Crissie Sickles, MD           03/09/2020

## 2020-03-09 ENCOUNTER — Ambulatory Visit (INDEPENDENT_AMBULATORY_CARE_PROVIDER_SITE_OTHER): Payer: PRIVATE HEALTH INSURANCE | Admitting: Family Medicine

## 2020-03-09 ENCOUNTER — Encounter: Payer: Self-pay | Admitting: Family Medicine

## 2020-03-09 ENCOUNTER — Other Ambulatory Visit: Payer: Self-pay | Admitting: Family Medicine

## 2020-03-09 ENCOUNTER — Other Ambulatory Visit: Payer: Self-pay

## 2020-03-09 VITALS — BP 106/70 | HR 71 | Temp 97.3°F | Resp 16 | Ht 63.0 in | Wt 158.0 lb

## 2020-03-09 DIAGNOSIS — B373 Candidiasis of vulva and vagina: Secondary | ICD-10-CM | POA: Diagnosis not present

## 2020-03-09 DIAGNOSIS — R457 State of emotional shock and stress, unspecified: Secondary | ICD-10-CM | POA: Diagnosis not present

## 2020-03-09 DIAGNOSIS — R300 Dysuria: Secondary | ICD-10-CM

## 2020-03-09 DIAGNOSIS — B3731 Acute candidiasis of vulva and vagina: Secondary | ICD-10-CM

## 2020-03-09 DIAGNOSIS — R109 Unspecified abdominal pain: Secondary | ICD-10-CM

## 2020-03-09 LAB — POCT URINALYSIS DIPSTICK
Bilirubin, UA: NEGATIVE
Blood, UA: NEGATIVE
Glucose, UA: NEGATIVE
Ketones, UA: NEGATIVE
Leukocytes, UA: NEGATIVE
Nitrite, UA: NEGATIVE
Protein, UA: POSITIVE — AB
Spec Grav, UA: 1.01 (ref 1.010–1.025)
Urobilinogen, UA: 0.2 E.U./dL
pH, UA: 8 (ref 5.0–8.0)

## 2020-03-09 MED ORDER — FLUCONAZOLE 150 MG PO TABS: ORAL_TABLET | ORAL | 0 refills | Status: DC

## 2020-03-09 MED ORDER — SULFAMETHOXAZOLE-TRIMETHOPRIM 800-160 MG PO TABS: 1.0000 | ORAL_TABLET | Freq: Two times a day (BID) | ORAL | 0 refills | Status: AC

## 2020-03-10 LAB — URINE CULTURE
MICRO NUMBER:: 11565145
SPECIMEN QUALITY:: ADEQUATE

## 2020-03-16 ENCOUNTER — Encounter: Payer: Self-pay | Admitting: Family Medicine

## 2020-03-31 ENCOUNTER — Other Ambulatory Visit: Payer: Self-pay

## 2020-03-31 ENCOUNTER — Emergency Department (HOSPITAL_BASED_OUTPATIENT_CLINIC_OR_DEPARTMENT_OTHER)
Admission: EM | Admit: 2020-03-31 | Discharge: 2020-03-31 | Disposition: A | Discharge status: Home / Self Care | Payer: PRIVATE HEALTH INSURANCE | Attending: Emergency Medicine | Admitting: Emergency Medicine

## 2020-03-31 ENCOUNTER — Emergency Department (HOSPITAL_BASED_OUTPATIENT_CLINIC_OR_DEPARTMENT_OTHER): Payer: PRIVATE HEALTH INSURANCE

## 2020-03-31 ENCOUNTER — Encounter (HOSPITAL_BASED_OUTPATIENT_CLINIC_OR_DEPARTMENT_OTHER): Payer: Self-pay

## 2020-03-31 DIAGNOSIS — M545 Low back pain, unspecified: Secondary | ICD-10-CM | POA: Insufficient documentation

## 2020-03-31 DIAGNOSIS — R109 Unspecified abdominal pain: Secondary | ICD-10-CM | POA: Diagnosis not present

## 2020-03-31 LAB — CBC WITH DIFFERENTIAL/PLATELET
Abs Immature Granulocytes: 0.01 10*3/uL (ref 0.00–0.07)
Basophils Absolute: 0 10*3/uL (ref 0.0–0.1)
Basophils Relative: 1 %
Eosinophils Absolute: 0.1 10*3/uL (ref 0.0–0.5)
Eosinophils Relative: 2 %
HCT: 43.6 % (ref 36.0–46.0)
Hemoglobin: 14.7 g/dL (ref 12.0–15.0)
Immature Granulocytes: 0 %
Lymphocytes Relative: 41 %
Lymphs Abs: 2.3 10*3/uL (ref 0.7–4.0)
MCH: 29.9 pg (ref 26.0–34.0)
MCHC: 33.7 g/dL (ref 30.0–36.0)
MCV: 88.8 fL (ref 80.0–100.0)
Monocytes Absolute: 0.5 10*3/uL (ref 0.1–1.0)
Monocytes Relative: 8 %
Neutro Abs: 2.7 10*3/uL (ref 1.7–7.7)
Neutrophils Relative %: 48 %
Platelets: 227 10*3/uL (ref 150–400)
RBC: 4.91 MIL/uL (ref 3.87–5.11)
RDW: 12 % (ref 11.5–15.5)
WBC: 5.6 10*3/uL (ref 4.0–10.5)
nRBC: 0 % (ref 0.0–0.2)

## 2020-03-31 LAB — COMPREHENSIVE METABOLIC PANEL
ALT: 16 U/L (ref 0–44)
AST: 23 U/L (ref 15–41)
Albumin: 4.3 g/dL (ref 3.5–5.0)
Alkaline Phosphatase: 64 U/L (ref 38–126)
Anion gap: 9 (ref 5–15)
BUN: 8 mg/dL (ref 6–20)
CO2: 28 mmol/L (ref 22–32)
Calcium: 9.2 mg/dL (ref 8.9–10.3)
Chloride: 103 mmol/L (ref 98–111)
Creatinine, Ser: 0.74 mg/dL (ref 0.44–1.00)
GFR, Estimated: >60 mL/min (ref >60)
Glucose, Bld: 91 mg/dL (ref 70–99)
Potassium: 3.9 mmol/L (ref 3.5–5.1)
Sodium: 140 mmol/L (ref 135–145)
Total Bilirubin: 0.4 mg/dL (ref 0.3–1.2)
Total Protein: 7.1 g/dL (ref 6.5–8.1)

## 2020-03-31 LAB — URINALYSIS, ROUTINE W REFLEX MICROSCOPIC
Bilirubin Urine: NEGATIVE
Glucose, UA: NEGATIVE mg/dL
Hgb urine dipstick: NEGATIVE
Ketones, ur: NEGATIVE mg/dL
Leukocytes,Ua: NEGATIVE
Nitrite: NEGATIVE
Protein, ur: NEGATIVE mg/dL
Specific Gravity, Urine: 1.01 (ref 1.005–1.030)
pH: 7.5 (ref 5.0–8.0)

## 2020-03-31 MED ORDER — PREDNISONE 10 MG PO TABS: ORAL_TABLET | ORAL | 0 refills | Status: AC

## 2020-03-31 MED ORDER — PREDNISONE 50 MG PO TABS
60.0000 mg | ORAL_TABLET | Freq: Once | ORAL | Status: AC
  Administered 2020-03-31: 60 mg via ORAL
  Filled 2020-03-31: qty 1

## 2020-03-31 MED ORDER — ACETAMINOPHEN 500 MG PO TABS
1000.0000 mg | ORAL_TABLET | Freq: Once | ORAL | Status: AC
  Administered 2020-03-31: 1000 mg via ORAL
  Filled 2020-03-31: qty 2

## 2020-03-31 NOTE — ED Notes (Signed)
ED Provider at bedside. 

## 2020-03-31 NOTE — Discharge Instructions (Addendum)
  Prednisone: Take the prednisone, as prescribed, until finished. If you are a diabetic, please know prednisone can raise your blood sugar temporarily.  Follow-up with your orthopedic specialist for any further management of this issue.  There was an abnormality to the shape of the left kidney on the CT scan.  We recommend follow-up with a urologist for further assessment of this issue.

## 2020-03-31 NOTE — ED Triage Notes (Signed)
Pt c/o left flank pain x 3 weeks-worse today-states feels like a kidney stone-NAD-steady gait

## 2020-03-31 NOTE — ED Provider Notes (Signed)
[facility] EMERGENCY DEPARTMENT Provider Note   CSN: [id number] Arrival date & time: 03/31/20  1447     History Chief Complaint  Patient presents with  . Flank Pain    Jacqueline Vance is a 60 y.o. female.  HPI      Jacqueline Vance is a 60 y.o. female, with a history of kidney stones, presenting to the ED with bilateral lower back pain for the past 3 weeks. Pain has been fairly constant.  Patient is very concerned about her kidneys.  She also states her pain is similar to past renal stones.  During the interview she does state she has been doing a fair amount of housecleaning lately that she would not normally do.  Denies fever/chills, dysuria, hematuria, abdominal pain, chest pain, shortness of breath, numbness, weakness, changes in bowel or bladder function, saddle anesthesias, falls/trauma, or any other complaints.  Past Medical History:  Diagnosis Date  . Adult ADHD   . Anxiety   . Aortic atherosclerosis ([place]) 08/2016  . Chronic back pain    neck, too.  MRI L spine 05/2008: 40 degrees dextroscoliosis due to a hemivertebra of L3.  Some DDD/spondylosis w/out spinal stenosis.  . Depression   . Diastolic dysfunction, left ventricle 05/2018   Echo with grd I DD.  Marland Kitchen Gout    2 attacks total as of 12/2014.  Marland Kitchen Kidney stones 2005; 2017; 2018   Most recent CT 08/2016 showed 3 mm nonobstructing L nephrolithiasis.  . Migraine    Proph tx with topamax helps, abortive tx with relpax helps.  Couldn't afford topamax so d/c'd this 04/2016.    . Navicular fracture, foot 03/2018   Right Navicular fx-nonunion (surgery)  . Pulmonary nodule 2018   5 mm LLL, stable on 2 yr f/u imaging 2022-->benign, no further imaging needed.  Marland Kitchen Restless legs syndrome    Pt can get to sleep fine so she declines med for this as of 08/2016    Patient Active Problem List   Diagnosis Date Noted  . Episode of recurrent major depressive disorder ([place]) 01/29/2020  . Pain in right foot 03/14/2018  .  Pain in joint of right elbow 03/14/2018  . Migraines 01/23/2018  . Chronic mixed headache syndrome 08/03/2014  . Cervicalgia 08/03/2014  . Chronic low back pain 08/03/2014  . Anxiety and depression 08/03/2014  . Adult ADHD 07/30/2013  . GAD (generalized anxiety disorder) 07/17/2013  . Health maintenance examination 08/12/2012  . Lymphadenitis 05/26/2011  . Viral syndrome 05/26/2011  . Fatigue 05/26/2011  . Vitamin D deficiency 05/26/2011  . Vitamin B12 deficiency 05/26/2011  . Overweight(278.02) 01/26/2011  . Chronic back pain 08/10/2010  . Depression 08/10/2010  . History of migraine headaches 08/10/2010  . Preventative health care 08/10/2010    Past Surgical History:  Procedure Laterality Date  . BREAST SURGERY  1999   reduction  . ENDOMETRIAL ABLATION  2002  . FOOT FRACTURE SURGERY  03/2018   Closed displaced fracture of right navicular, nonunion.  Marland Kitchen KIDNEY STONE SURGERY  2005  . LAPAROSCOPIC VAGINAL HYSTERECTOMY  12/02/07   for DUB  . LUMBAR ESI  2021   no help ([facility])  . REDUCTION MAMMAPLASTY    . TRANSTHORACIC ECHOCARDIOGRAM  06/13/2018   Grd I DD     OB History    Gravida  1   Para  1   Term      Preterm      AB      Living  SAB      IAB      Ectopic      Multiple      Live Births              Family History  Problem Relation Age of Onset  . Cancer Mother        brain ca (glioblastoma)  . Breast cancer Paternal Aunt   . Breast cancer Maternal Grandmother     Social History   Tobacco Use  . Smoking status: Never Smoker  . Smokeless tobacco: Never Used  Vaping Use  . Vaping Use: Never used  Substance Use Topics  . Alcohol use: No  . Drug use: No    Home Medications Prior to Admission medications   Medication Sig Start Date End Date Taking? Authorizing Provider  predniSONE (DELTASONE) 10 MG tablet Take 4 tablets (40 mg total) by mouth daily for 5 days, THEN 3 tablets (30 mg total) daily for 1 day, THEN 2 tablets (20  mg total) daily for 1 day, THEN 1 tablet (10 mg total) daily for 1 day. 03/31/20 04/08/20 Yes Brendalee Matthies C, PA-C  albuterol (VENTOLIN HFA) 108 (90 Base) MCG/ACT inhaler Inhale 2 puffs into the lungs every 4 (four) hours as needed for wheezing or shortness of breath. Patient not taking: Reported on 03/09/2020 12/27/18   Tammi Sou, MD  ALPRAZolam Duanne Moron) 1 MG tablet TAKE ONE TABLET BY MOUTH TWICE A DAY AS NEEDED FOR ANXIETY 12/15/19   McGowen, Adrian Blackwater, MD  Biotin 10000 MCG TABS Take 1 tablet by mouth daily.    [provider]  celecoxib (CELEBREX) 200 MG capsule TAKE ONE CAPSULE BY MOUTH TWICE A DAY (EVERY 12 HOURS) AS NEEDED 06/25/19   McGowen, Adrian Blackwater, MD  Cholecalciferol (VITAMIN D3) 5000 units CAPS Take 1 capsule by mouth daily.    [provider]  citalopram (CELEXA) 40 MG tablet TAKE ONE (1) TABLET BY MOUTH EVERY DAY 03/09/20   McGowen, Adrian Blackwater, MD  colchicine 0.6 MG tablet 2 tabs po at onset of gout pain, then 1 tab po one hour later 01/15/15   Tammi Sou, MD  Cyanocobalamin (VITAMIN B-12 PO) Take 1 tablet by mouth daily. Reported on 04/07/2015    [provider]  eletriptan (RELPAX) 40 MG tablet 1 tab po at onset of migraine HA.  May repeat in 2 hours if headache persists or recurs. 01/28/20   Tammi Sou, MD  fluconazole (DIFLUCAN) 150 MG tablet 1 tab po qd x 3d 03/09/20   McGowen, Adrian Blackwater, MD  fluorouracil (EFUDEX) 5 % cream Apply to areas of sun damage on the face twice daily for 4-5 days 06/25/19   [provider]  HYDROcodone-acetaminophen (NORCO/VICODIN) 5-325 MG tablet 1 tab po tid prn pain 01/29/20   McGowen, Adrian Blackwater, MD  lidocaine (LIDODERM) 5 % 1 patch to the affected area q12h prn 12/27/18   McGowen, Adrian Blackwater, MD  pantoprazole (PROTONIX) 40 MG tablet TAKE ONE (1) TABLET BY MOUTH EVERY DAY 12/15/19   McGowen, Adrian Blackwater, MD  tiZANidine (ZANAFLEX) 4 MG tablet TAKE ONE TABLET (4MG  TOTAL) BY MOUTH EVERY SIX HOURS AS NEEDED FOR MUSCLE SPASMS  09/02/19   McGowen, Adrian Blackwater, MD  tretinoin (RETIN-A) 0.025 % cream Apply 1 application topically at bedtime as needed. For acne prevention/control 09/06/18   McGowen, Adrian Blackwater, MD    Allergies    Imitrex [sumatriptan]  Review of Systems   Review of Systems  Constitutional:  Negative for chills and fever.  Respiratory: Negative for shortness of breath.   Cardiovascular: Negative for chest pain.  Gastrointestinal: Negative for abdominal pain, diarrhea, nausea and vomiting.  Genitourinary: Negative for difficulty urinating, dysuria, flank pain, frequency and hematuria.  Musculoskeletal: Positive for back pain.  Neurological: Negative for weakness and numbness.  All other systems reviewed and are negative.   Physical Exam Updated Vital Signs BP 132/79 (BP Location: Left Arm)   Pulse 62   Temp 97.8 F (36.6 C) (Oral)   Resp 18   Ht 5\' 3"  (1.6 m)   Wt 68 kg   SpO2 100%   BMI 26.57 kg/m   Physical Exam Vitals and nursing note reviewed.  Constitutional:      General: She is not in acute distress.    Appearance: She is well-developed. She is not diaphoretic.  HENT:     Head: Normocephalic and atraumatic.     Mouth/Throat:     Mouth: Mucous membranes are moist.     Pharynx: Oropharynx is clear.  Eyes:     Conjunctiva/sclera: Conjunctivae normal.  Cardiovascular:     Rate and Rhythm: Normal rate and regular rhythm.     Pulses: Normal pulses.          Radial pulses are 2+ on the right side and 2+ on the left side.       Posterior tibial pulses are 2+ on the right side and 2+ on the left side.     Comments: Tactile temperature in the extremities appropriate and equal bilaterally. Pulmonary:     Effort: Pulmonary effort is normal. No respiratory distress.     Breath sounds: Normal breath sounds.  Abdominal:     Palpations: Abdomen is soft.     Tenderness: There is no abdominal tenderness. There is right CVA tenderness. There is no guarding.  Musculoskeletal:     Cervical  back: Neck supple.       Back:     Right lower leg: No edema.     Left lower leg: No edema.  Skin:    General: Skin is warm and dry.  Neurological:     Mental Status: She is alert.     Comments: Sensation grossly intact to light touch in the lower extremities bilaterally. No saddle anesthesias. Strength 5/5 in the bilateral lower extremities. No noted gait deficit. Coordination intact.  Psychiatric:        Mood and Affect: Mood and affect normal.        Speech: Speech normal.        Behavior: Behavior normal.     ED Results / Procedures / Treatments   Labs (all labs ordered are listed, but only abnormal results are displayed) Labs Reviewed  URINALYSIS, ROUTINE W REFLEX MICROSCOPIC  COMPREHENSIVE METABOLIC PANEL  CBC WITH DIFFERENTIAL/PLATELET    EKG None  Radiology CT Renal Stone Study  Addendum Date: 03/31/2020   ADDENDUM REPORT: 03/31/2020 17:19 ADDENDUM: These results were called by telephone at the time of interpretation on 03/31/2020 at 5:18 pm to provider [facility] , who verbally acknowledged these results. Electronically Signed   By: Zetta Bills M.D.   On: 03/31/2020 17:19   Result Date: 03/31/2020 CLINICAL DATA:  Flank pain, kidney stone suspected. Bilateral abdominal pain for 1 week with history of kidney stones. EXAM: CT ABDOMEN AND PELVIS WITHOUT CONTRAST TECHNIQUE: Multidetector CT imaging of the abdomen and pelvis was performed following the standard protocol without IV contrast. COMPARISON:  August of 2018. FINDINGS:  Lower chest: Incidental imaging of the lung bases is unremarkable. Hepatobiliary: Normal hepatic contour. No focal, suspicious hepatic lesion. Limited assessment in the absence of intravenous contrast. No pericholecystic stranding. No gross biliary duct distension. Pancreas: Pancreas without signs of contour abnormality or surrounding stranding. No gross ductal dilation. Spleen: Spleen is normal size without focal lesion. Adrenals/Urinary Tract:  Adrenal glands are normal. No nephrolithiasis. Smooth contour of the urinary bladder. No ureteral calculi. No perinephric stranding. Focal contour abnormality along the upper pole of the LEFT kidney not clearly cystic but low-density measuring approximately 13 mm. Not seen on prior imaging. Stomach/Bowel: No acute gastric process. Mild distension of the stomach. Small bowel with no evidence of dilation or adjacent stranding. Normal appendix. Stool fills much of the colon. No pericolonic stranding. Mild diverticulosis of the sigmoid colon. Vascular/Lymphatic: Calcified atheromatous plaque of the abdominal aorta. No aneurysmal dilation. There is no gastrohepatic or hepatoduodenal ligament lymphadenopathy. No retroperitoneal or mesenteric lymphadenopathy. No pelvic sidewall lymphadenopathy. Reproductive: Post hysterectomy.  No adnexal masses. Other: No ascites.  No free air. Musculoskeletal: No acute musculoskeletal process. Spinal degenerative changes. Moderate to marked dextroconvex scoliotic curvature of the spine associated with vertebral anomaly hemi vertebrae, on the LEFT in the mid lumbar spine at the "L3" level and surrounding degenerative changes as before. IMPRESSION: 1. No acute findings in the abdomen or pelvis. 2. Focal contour abnormality along the upper pole of the LEFT kidney not clearly cystic but low-density measuring approximately 13 mm. Not seen on prior imaging. This may represent a small cyst or partially cystic lesion. Suggest follow-up renal ultrasound or renal protocol CT for further evaluation. 3. Mild sigmoid diverticulosis without evidence of acute diverticulitis. 4. Spinal degenerative changes associated with vertebral anomaly in the mid lumbar spine. 5. Aortic atherosclerosis. Call is out to the referring provider to further discuss findings in the above case. Aortic Atherosclerosis (ICD10-I70.0). Electronically Signed: By: Zetta Bills M.D. On: 03/31/2020 17:13     Procedures Procedures   Medications Ordered in ED Medications  acetaminophen (TYLENOL) tablet 1,000 mg (1,000 mg Oral Given 03/31/20 1856)  predniSONE (DELTASONE) tablet 60 mg (60 mg Oral Given 03/31/20 1856)    ED Course  I have reviewed the triage vital signs and the nursing notes.  Pertinent labs & imaging results that were available during my care of the patient were reviewed by me and considered in my medical decision making (see chart for details).  Clinical Course as of 03/31/20 2007  Wed Mar 31, 2020  1715 Spoke with Dr. Jacalyn Lefevre, radiologist, regarding the abnormality noted to the left kidney.  He states it will need further imaging and evaluation, however, this does not need to be done in the emergency department. [SJ]    Clinical Course User Index [SJ] Shreyas Piatkowski, Helane Gunther, PA-C   MDM Rules/Calculators/A&P                          Patient presents with bilateral lower back pain for the past 3 weeks. Patient is nontoxic appearing, afebrile, not tachycardic, not tachypneic, not hypotensive, maintains excellent SPO2 on room air, and is in no apparent distress.  No focal neurologic deficit.  Presentation not suggestive of neurosurgical emergency, such as cauda equina. I have reviewed the patient's chart to obtain more information.   I reviewed and interpreted the patient's labs and radiological studies. Lab work unremarkable.  Abnormality noted to the left kidney was discussed with the patient as well as recommended  follow-up for specialty consultation as well as imaging. Treatment options for the patient's pain were discussed.  She states she has a few different muscle relaxers at home.  She also takes Celebrex. The patient was given instructions for home care as well as return precautions. Patient voices understanding of these instructions, accepts the plan, and is comfortable with discharge.     Final Clinical Impression(s) / ED Diagnoses Final diagnoses:  Acute bilateral low  back pain without sciatica    Rx / DC Orders ED Discharge Orders         Ordered    predniSONE (DELTASONE) 10 MG tablet        03/31/20 1927           Layla Maw 03/31/20 2008    Pickering, Nathan, MD 03/31/20 2303

## 2020-03-31 NOTE — ED Notes (Signed)
Patient transported to CT 

## 2020-04-01 ENCOUNTER — Telehealth: Payer: Self-pay

## 2020-04-01 NOTE — Telephone Encounter (Signed)
Please assist with scheduling ED follow up as recommended for referral and imaging.

## 2020-04-01 NOTE — Telephone Encounter (Signed)
Left message on patient's voice mail to return my call.

## 2020-04-01 NOTE — Telephone Encounter (Signed)
Patient seen last in ED last night. Patient has cyst on kidney.  ED physician told patient to call PCP to have provider refer patient to Urologist as soon as positive. Patient has seen someone at [facility] several years ago.  She prefers to go to someone at [facility] and if they have a [place] office since she lives in [place].   Patient can be reached at [phone]

## 2020-04-02 NOTE — Telephone Encounter (Signed)
Routed to PCP team pool

## 2020-04-02 NOTE — Telephone Encounter (Signed)
Pt left message to schedule follow up for ED visit based on information in ED note to follow up for specialty consultation and imaging.   Please advise if other suggestions.

## 2020-04-02 NOTE — Telephone Encounter (Signed)
Patient is scheduled for 3/22

## 2020-04-02 NOTE — Telephone Encounter (Signed)
This is non-urgent f/u. Offer any open slot in my schedule that fits her schedule.-thx

## 2020-04-02 NOTE — Telephone Encounter (Signed)
Please assist with scheduling, thanks.

## 2020-04-02 NOTE — Telephone Encounter (Signed)
2nd attempt to contact patient regarding f/u ED.  Left specific details about appt at 3PM with Dr. Anitra Lauth today. Appt slot will be held for her until 11:30AM.  Patient must call back to confirm appt.  Appt will be unblocked if patient does not confirm.  3/18 / 8:40AM

## 2020-04-05 ENCOUNTER — Other Ambulatory Visit: Payer: Self-pay

## 2020-04-06 ENCOUNTER — Encounter: Payer: Self-pay | Admitting: Family Medicine

## 2020-04-06 ENCOUNTER — Ambulatory Visit (INDEPENDENT_AMBULATORY_CARE_PROVIDER_SITE_OTHER): Payer: PRIVATE HEALTH INSURANCE | Admitting: Family Medicine

## 2020-04-06 VITALS — BP 112/70 | HR 62 | Temp 97.6°F | Resp 16 | Ht 63.0 in | Wt 158.0 lb

## 2020-04-06 DIAGNOSIS — G8929 Other chronic pain: Secondary | ICD-10-CM

## 2020-04-06 DIAGNOSIS — N2889 Other specified disorders of kidney and ureter: Secondary | ICD-10-CM

## 2020-04-06 DIAGNOSIS — M545 Low back pain, unspecified: Secondary | ICD-10-CM | POA: Diagnosis not present

## 2020-04-06 DIAGNOSIS — G894 Chronic pain syndrome: Secondary | ICD-10-CM

## 2020-04-06 MED ORDER — LIDOCAINE 5 % EX PTCH: MEDICATED_PATCH | CUTANEOUS | 11 refills | Status: DC

## 2020-04-06 NOTE — Progress Notes (Signed)
OFFICE VISIT  04/06/2020  CC:  Chief Complaint  Patient presents with  . ED follow up    HPI:    Patient is a 60 y.o. Caucasian female who presents for f/u ED visit for low back pain on 03/31/20 (6 days ago). Presented to med center [facility] on 03/31/20 for a few weeks of persistent bilat LBP w/out radiculopathy but was worried mainly about kidney stones. CT renal stone study showed no stones but picked up an abnormality of left kidney that requires f/u imaging and pt presents to discuss this today--> CT renal stone study on 03/31/20: IMPRESSION: 1. No acute findings in the abdomen or pelvis. 2. Focal contour abnormality along the upper pole of the LEFT kidney not clearly cystic but low-density measuring approximately 13 mm. Not seen on prior imaging. This may represent a small cyst or partially cystic lesion. Suggest follow-up renal ultrasound or renal protocol CT for further evaluation. 3. Mild sigmoid diverticulosis without evidence of acute diverticulitis. 4. Spinal degenerative changes associated with vertebral anomaly in the mid lumbar spine. 5. Aortic atherosclerosis.   The CT scan also showed "spinal degenerative changes. Moderate to marked dextroconvex scoliotic curvature of the spine associated with vertebral anomaly hemi-vertebrae, on the LEFT in the mid lumbar spine at the "L3" level and surrounding degenerative changes as before".  CURRENTLY: EDP put her on prednisone for her back (8 d taper). Back feeling better today but has in lots of pain and essentially lying in bed the last 4-5 d with bilat LBP diffuse across lumbar spine region and paraspinous mm's/soft tissues. She had been doing extra work in yard + taking care of mother in law a lot in the time preceding/coincidental with inc in back pain.  No pain radiating down legs.  Some pain does extend into hips bilat.  No paresthesias.  No LE weakness. Still taking celebrex daily and methocarbamol qhs.   Past Medical  History:  Diagnosis Date  . Adult ADHD   . Anxiety   . Aortic atherosclerosis ([place]) 08/2016  . Chronic back pain    neck, too.  MRI L spine 05/2008: 40 degrees dextroscoliosis due to a hemivertebra of L3.  Some DDD/spondylosis w/out spinal stenosis.  . Depression   . Diastolic dysfunction, left ventricle 05/2018   Echo with grd I DD.  Marland Kitchen Gout    2 attacks total as of 12/2014.  Marland Kitchen Kidney stones 2005; 2017; 2018   Most recent CT 08/2016 showed 3 mm nonobstructing L nephrolithiasis.  . Migraine    Proph tx with topamax helps, abortive tx with relpax helps.  Couldn't afford topamax so d/c'd this 04/2016.    . Navicular fracture, foot 03/2018   Right Navicular fx-nonunion (surgery)  . Pulmonary nodule 2018   5 mm LLL, stable on 2 yr f/u imaging 2022-->benign, no further imaging needed.  Marland Kitchen Restless legs syndrome    Pt can get to sleep fine so she declines med for this as of 08/2016    Past Surgical History:  Procedure Laterality Date  . BREAST SURGERY  1999   reduction  . ENDOMETRIAL ABLATION  2002  . FOOT FRACTURE SURGERY  03/2018   Closed displaced fracture of right navicular, nonunion.  Marland Kitchen KIDNEY STONE SURGERY  2005  . LAPAROSCOPIC VAGINAL HYSTERECTOMY  12/02/07   for DUB  . LUMBAR ESI  2021   no help ([facility])  . REDUCTION MAMMAPLASTY    . TRANSTHORACIC ECHOCARDIOGRAM  06/13/2018   Grd I DD  Outpatient Medications Prior to Visit  Medication Sig Dispense Refill  . ALPRAZolam (XANAX) 1 MG tablet TAKE ONE TABLET BY MOUTH TWICE A DAY AS NEEDED FOR ANXIETY 180 tablet 1  . celecoxib (CELEBREX) 200 MG capsule TAKE ONE CAPSULE BY MOUTH TWICE A DAY (EVERY 12 HOURS) AS NEEDED 60 capsule 6  . citalopram (CELEXA) 40 MG tablet TAKE ONE (1) TABLET BY MOUTH EVERY DAY 60 tablet 0  . Cyanocobalamin (VITAMIN B-12 PO) Take 1 tablet by mouth daily. Reported on 04/07/2015    . eletriptan (RELPAX) 40 MG tablet 1 tab po at onset of migraine HA.  May repeat in 2 hours if headache persists or recurs. 6  tablet 6  . fluorouracil (EFUDEX) 5 % cream Apply to areas of sun damage on the face twice daily for 4-5 days    . HYDROcodone-acetaminophen (NORCO/VICODIN) 5-325 MG tablet 1 tab po tid prn pain 90 tablet 0  . pantoprazole (PROTONIX) 40 MG tablet TAKE ONE (1) TABLET BY MOUTH EVERY DAY 90 tablet 3  . predniSONE (DELTASONE) 10 MG tablet Take 4 tablets (40 mg total) by mouth daily for 5 days, THEN 3 tablets (30 mg total) daily for 1 day, THEN 2 tablets (20 mg total) daily for 1 day, THEN 1 tablet (10 mg total) daily for 1 day. 26 tablet 0  . tretinoin (RETIN-A) 0.025 % cream Apply 1 application topically at bedtime as needed. For acne prevention/control 45 g 3  . lidocaine (LIDODERM) 5 % 1 patch to the affected area q12h prn 30 patch 11  . albuterol (VENTOLIN HFA) 108 (90 Base) MCG/ACT inhaler Inhale 2 puffs into the lungs every 4 (four) hours as needed for wheezing or shortness of breath. (Patient not taking: No sig reported) 18 g 0  . Biotin 10000 MCG TABS Take 1 tablet by mouth daily. (Patient not taking: Reported on 04/06/2020)    . colchicine 0.6 MG tablet 2 tabs po at onset of gout pain, then 1 tab po one hour later (Patient not taking: Reported on 04/06/2020) 15 tablet 2  . tiZANidine (ZANAFLEX) 4 MG tablet TAKE ONE TABLET (4MG  TOTAL) BY MOUTH EVERY SIX HOURS AS NEEDED FOR MUSCLE SPASMS (Patient not taking: Reported on 04/06/2020) 30 tablet 1  . Cholecalciferol (VITAMIN D3) 5000 units CAPS Take 1 capsule by mouth daily. (Patient not taking: Reported on 04/06/2020)    . fluconazole (DIFLUCAN) 150 MG tablet 1 tab po qd x 3d (Patient not taking: Reported on 04/06/2020) 3 tablet 0   No facility-administered medications prior to visit.    Allergies  Allergen Reactions  . Imitrex [Sumatriptan] Other (See Comments)    Face and Arm numbness    ROS As per HPI  PE: Vitals with BMI 04/06/2020 03/31/2020 03/31/2020  Height 5\' 3"  - -  Weight 158 lbs - -  BMI 28 - -  Systolic [phone]  Diastolic 70  86 70  Pulse 62 70 66     Gen: Alert, well appearing.  Patient is oriented to person, place, time, and situation. AFFECT: pleasant, lucid thought and speech. No further exam today.  LABS:  Lab Results  Component Value Date   TSH 1.11 10/28/2019   Lab Results  Component Value Date   WBC 5.6 03/31/2020   HGB 14.7 03/31/2020   HCT 43.6 03/31/2020   MCV 88.8 03/31/2020   PLT 227 03/31/2020   Lab Results  Component Value Date   CREATININE 0.74 03/31/2020   BUN 8 03/31/2020  NA 140 03/31/2020   K 3.9 03/31/2020   CL 103 03/31/2020   CO2 28 03/31/2020   Lab Results  Component Value Date   ALT 16 03/31/2020   AST 23 03/31/2020   ALKPHOS 64 03/31/2020   BILITOT 0.4 03/31/2020   Lab Results  Component Value Date   CHOL 195 10/28/2019   Lab Results  Component Value Date   HDL 44.00 10/28/2019   Lab Results  Component Value Date   LDLCALC 130 (H) 10/28/2019   Lab Results  Component Value Date   TRIG 105.0 10/28/2019   Lab Results  Component Value Date   CHOLHDL 4 10/28/2019    IMPRESSION AND PLAN:  1) Left kidney lesion, indistinct. Needs f/u imaging-->will order ultrasound as per radiologist's recommendation.  2) Musculoskeletal LBP, acute-on-chronic:  Improved last 48h or so, plan to finish prednisone taper rx'd by EDP, cont lidocaine patches, cont methocarbamol qhs, cont celebrex 200mg  qd. She may need to eventually f/u with her orthopedist. No further imaging is indicated at this time. She has vicodin that she has taken long term prn as per our pain mgmt contract agreement.  An After Visit Summary was printed and given to the patient.  FOLLOW UP: Return if symptoms worsen or fail to improve.  Signed:  Crissie Sickles, MD           04/06/2020

## 2020-04-28 ENCOUNTER — Ambulatory Visit: Payer: PRIVATE HEALTH INSURANCE | Admitting: Family Medicine

## 2020-05-17 ENCOUNTER — Encounter: Payer: Self-pay | Admitting: Family Medicine

## 2020-05-17 ENCOUNTER — Other Ambulatory Visit: Payer: Self-pay | Admitting: Family Medicine

## 2020-05-17 ENCOUNTER — Other Ambulatory Visit: Payer: Self-pay

## 2020-05-17 ENCOUNTER — Telehealth (INDEPENDENT_AMBULATORY_CARE_PROVIDER_SITE_OTHER): Payer: PRIVATE HEALTH INSURANCE | Admitting: Family Medicine

## 2020-05-17 DIAGNOSIS — M545 Low back pain, unspecified: Secondary | ICD-10-CM | POA: Diagnosis not present

## 2020-05-17 DIAGNOSIS — M7918 Myalgia, other site: Secondary | ICD-10-CM

## 2020-05-17 DIAGNOSIS — F411 Generalized anxiety disorder: Secondary | ICD-10-CM | POA: Diagnosis not present

## 2020-05-17 DIAGNOSIS — G8929 Other chronic pain: Secondary | ICD-10-CM

## 2020-05-17 DIAGNOSIS — G894 Chronic pain syndrome: Secondary | ICD-10-CM

## 2020-05-17 MED ORDER — CITALOPRAM HYDROBROMIDE 40 MG PO TABS: 40.0000 mg | ORAL_TABLET | Freq: Every day | ORAL | 3 refills | Status: DC

## 2020-05-17 MED ORDER — HYDROCODONE-ACETAMINOPHEN 5-325 MG PO TABS
ORAL_TABLET | ORAL | 0 refills | Status: DC
Start: 2020-05-17 — End: 2020-06-10

## 2020-05-17 MED ORDER — TIZANIDINE HCL 4 MG PO TABS
ORAL_TABLET | ORAL | 1 refills | Status: DC
Start: 2020-05-17 — End: 2020-07-30

## 2020-05-17 NOTE — Progress Notes (Signed)
Virtual Visit via Video Note  I connected with pt on 05/17/20 at  2:00 PM EDT by a video enabled telemedicine application and verified that I am speaking with the correct person using two identifiers.  Location patient: home, [place] Location provider:work or home office Persons participating in the virtual visit: patient, provider  I discussed the limitations of evaluation and management by telemedicine and the availability of in person appointments. The patient expressed understanding and agreed to proceed.   HPI: 59 y/o WF being seen for f/u chronic pain syndrome and GAD. A/P as of last visit 04/06/20: "1) Left kidney lesion, indistinct. Needs f/u imaging-->will order ultrasound as per radiologist's recommendation.  2) Musculoskeletal LBP, acute-on-chronic:  Improved last 48h or so, plan to finish prednisone taper rx'd by EDP, cont lidocaine patches, cont methocarbamol qhs, cont celebrex 200mg  qd. She may need to eventually f/u with her orthopedist. No further imaging is indicated at this time. She has vicodin that she has taken long term prn as per our pain mgmt contract agreement."   INTERIM HX:  Hurting a lot in between shoulder blades lately. Celebrex bid prn and vicodin tid prn helps her pain in LB/mid back/neck but not much for the area between shoulder blades.  Tizanidine very helpful at night and she sleeps well b/c pain better controlled. Different mattress lately.   No pain radiating down legs since last visit. No acute strain recalled.  She is no longer lifting/overworking by taking care of her mother in law. Denies any adverse side effects from celebrex or vicodin.  No constipation.  She did not get the renal u/s I ordered last visit yet--she's waiting for June when her insurance payment would be more favorable.    Indication for chronic opioid:neck, mid back, low back, HA's, myofascial pain, myalgias of appendicular skeleton. Some days finds it hard to get out of bed  and function.  She carries dx of DDD of C and L spine, significant scoliosis, myofascial pain syndrome with trigger points and recurrent trochanteric bursitis. Has seen a handful of specialists in the past (spine and scoliosis center in [place], [facility], neurologist, in [place], also [facility] and wellness center in [place]). She has never been managed by a pain mgmt specialist. Medication and dose:vicodin 5/325, 1 tid prn. PMP AWARE reviewed today: most recent rx for vicodin 5/325 was filled 04/15/20, # 43, rx by me. No red flags.  Anxiety and hx of MDD: managed long term with citalopram 40mg  qd and alprazolam 1mg  bid prn. PMP AWARE reviewed today: most recent rx for alprazolam 1 mg was filled 03/16/20, # 180, rx by me. No red flags. GAD: anxiety level pretty stable as is her mood.  ROS: See pertinent positives and negatives per HPI.  Past Medical History:  Diagnosis Date  . Adult ADHD   . Anxiety   . Aortic atherosclerosis ([place]) 08/2016  . Chronic back pain    neck, too.  MRI L spine 05/2008: 40 degrees dextroscoliosis due to a hemivertebra of L3.  Some DDD/spondylosis w/out spinal stenosis.  . Depression   . Diastolic dysfunction, left ventricle 05/2018   Echo with grd I DD.  Marland Kitchen Gout    2 attacks total as of 12/2014.  Marland Kitchen Kidney stones 2005; 2017; 2018   Most recent CT 08/2016 showed 3 mm nonobstructing L nephrolithiasis.  . Migraine    Proph tx with topamax helps, abortive tx with relpax helps.  Couldn't afford topamax so d/c'd this 04/2016.    Marland Kitchen  Navicular fracture, foot 03/2018   Right Navicular fx-nonunion (surgery)  . Pulmonary nodule 2018   5 mm LLL, stable on 2 yr f/u imaging 2022-->benign, no further imaging needed.  Marland Kitchen Restless legs syndrome    Pt can get to sleep fine so she declines med for this as of 08/2016    Past Surgical History:  Procedure Laterality Date  . BREAST SURGERY  1999   reduction  . ENDOMETRIAL ABLATION  2002  . FOOT FRACTURE SURGERY  03/2018   Closed displaced  fracture of right navicular, nonunion.  Marland Kitchen KIDNEY STONE SURGERY  2005  . LAPAROSCOPIC VAGINAL HYSTERECTOMY  12/02/07   for DUB  . LUMBAR ESI  2021   no help ([facility])  . REDUCTION MAMMAPLASTY    . TRANSTHORACIC ECHOCARDIOGRAM  06/13/2018   Grd I DD     Current Outpatient Medications:  .  ALPRAZolam (XANAX) 1 MG tablet, TAKE ONE TABLET BY MOUTH TWICE A DAY AS NEEDED FOR ANXIETY, Disp: 180 tablet, Rfl: 1 .  Biotin 10000 MCG TABS, Take 1 tablet by mouth daily., Disp: , Rfl:  .  celecoxib (CELEBREX) 200 MG capsule, TAKE ONE CAPSULE BY MOUTH TWICE A DAY (EVERY 12 HOURS) AS NEEDED, Disp: 60 capsule, Rfl: 6 .  citalopram (CELEXA) 40 MG tablet, TAKE ONE (1) TABLET BY MOUTH EVERY DAY, Disp: 60 tablet, Rfl: 0 .  Cyanocobalamin (VITAMIN B-12 PO), Take 1 tablet by mouth daily. Reported on 04/07/2015, Disp: , Rfl:  .  eletriptan (RELPAX) 40 MG tablet, 1 tab po at onset of migraine HA.  May repeat in 2 hours if headache persists or recurs., Disp: 6 tablet, Rfl: 6 .  fluorouracil (EFUDEX) 5 % cream, Apply to areas of sun damage on the face twice daily for 4-5 days, Disp: , Rfl:  .  HYDROcodone-acetaminophen (NORCO/VICODIN) 5-325 MG tablet, 1 tab po tid prn pain, Disp: 90 tablet, Rfl: 0 .  lidocaine (LIDODERM) 5 %, 1 patch to the affected area q12h prn, Disp: 30 patch, Rfl: 11 .  pantoprazole (PROTONIX) 40 MG tablet, TAKE ONE (1) TABLET BY MOUTH EVERY DAY, Disp: 90 tablet, Rfl: 3 .  tiZANidine (ZANAFLEX) 4 MG tablet, TAKE ONE TABLET (4MG  TOTAL) BY MOUTH EVERY SIX HOURS AS NEEDED FOR MUSCLE SPASMS, Disp: 30 tablet, Rfl: 1 .  tretinoin (RETIN-A) 0.025 % cream, Apply 1 application topically at bedtime as needed. For acne prevention/control, Disp: 45 g, Rfl: 3 .  albuterol (VENTOLIN HFA) 108 (90 Base) MCG/ACT inhaler, Inhale 2 puffs into the lungs every 4 (four) hours as needed for wheezing or shortness of breath. (Patient not taking: No sig reported), Disp: 18 g, Rfl: 0 .  colchicine 0.6 MG tablet, 2 tabs po at  onset of gout pain, then 1 tab po one hour later (Patient not taking: No sig reported), Disp: 15 tablet, Rfl: 2  EXAM:  VITALS per patient if applicable:  Vitals with BMI 04/06/2020 03/31/2020 03/31/2020  Height 5\' 3"  - -  Weight 158 lbs - -  BMI 28 - -  Systolic 220 254 270  Diastolic 70 86 70  Pulse 62 70 66     GENERAL: alert, oriented, appears well and in no acute distress  HEENT: atraumatic, conjunttiva clear, no obvious abnormalities on inspection of external nose and ears  NECK: normal movements of the head and neck  LUNGS: on inspection no signs of respiratory distress, breathing rate appears normal, no obvious gross SOB, gasping or wheezing  CV: no obvious cyanosis  MS: moves all visible extremities without noticeable abnormality  PSYCH/NEURO: pleasant and cooperative, no obvious depression or anxiety, speech and thought processing grossly intact  LABS: none today  Lab Results  Component Value Date   TSH 1.11 10/28/2019   Lab Results  Component Value Date   WBC 5.6 03/31/2020   HGB 14.7 03/31/2020   HCT 43.6 03/31/2020   MCV 88.8 03/31/2020   PLT 227 03/31/2020   Lab Results  Component Value Date   CREATININE 0.74 03/31/2020   BUN 8 03/31/2020   NA 140 03/31/2020   K 3.9 03/31/2020   CL 103 03/31/2020   CO2 28 03/31/2020   Lab Results  Component Value Date   ALT 16 03/31/2020   AST 23 03/31/2020   ALKPHOS 64 03/31/2020   BILITOT 0.4 03/31/2020   Lab Results  Component Value Date   CHOL 195 10/28/2019   Lab Results  Component Value Date   HDL 44.00 10/28/2019   Lab Results  Component Value Date   LDLCALC 130 (H) 10/28/2019   Lab Results  Component Value Date   TRIG 105.0 10/28/2019   Lab Results  Component Value Date   CHOLHDL 4 10/28/2019    ASSESSMENT AND PLAN:  Discussed the following assessment and plan:  1) Chronic pain syndrome: stable except a bit more myofascial pain in mid/upper back recently. She is going to try a  soft back support as well as heat application. Cont celebrex 200 mg bid prn. Cont vicodin 5/325, 1 tid prn, #90-->rx sent in today. CSC and UDS UTD.  2) GAD: stable. Cont citalopram 40mg  qd->eRx'd today. Continue xanax 1mg , 1 bid prn-->new rx NOT needed for this med today. CSC and UDS UTD.  I discussed the assessment and treatment plan with the patient. The patient was provided an opportunity to ask questions and all were answered. The patient agreed with the plan and demonstrated an understanding of the instructions.   F/u: 58mo f/u chronic pain Next cpe 01/2021  Signed:  Crissie Sickles, MD           05/17/2020

## 2020-05-17 NOTE — Telephone Encounter (Signed)
Please refuse. Reordered in appt today

## 2020-06-10 ENCOUNTER — Other Ambulatory Visit: Payer: Self-pay | Admitting: Family Medicine

## 2020-06-10 ENCOUNTER — Telehealth: Payer: Self-pay

## 2020-06-10 NOTE — Telephone Encounter (Signed)
Last RF for Norco was 05/17/20 (90,0) and alprazolam 12/15/19(180,1). Last seen 05/17/20  Please Advise.

## 2020-06-10 NOTE — Telephone Encounter (Signed)
Patient called in 2 refills to pharmacy - she is leaving to go to [place] tomorrow morning - Fill date is okay for Monday 5/30, [facility] closed, plus she is out of town.    Please advise if any options.  She understands govt rules & regulations and insurance rules ( will pay 2 days prior to "fill date") She is aware no cash pay options.  She is just unsure of what to do.  She said she may just have to wait until she returns.    HYDROcodone-acetaminophen (NORCO/VICODIN) 5-325 MG tablet [[id number]]   ALPRAZolam Duanne Moron) 1 MG tablet [[id number]]    [place]

## 2020-06-10 NOTE — Telephone Encounter (Signed)
Where is she right now?

## 2020-06-10 NOTE — Telephone Encounter (Signed)
Requesting: alprazolam Contract:11/06/19 UDS: 10/28/19 Last Visit:05/17/20 Next Visit: advised to f/u 3 mo Last Refill:12/15/19 (180,1)   Requesting: Norco Contract:11/06/19 UDS:11/06/19 Last Visit:05/17/20 Next Visit: advised to f/u 3 mo. Last Refill:05/17/20 (90,0)  Please Advise. Meds pending

## 2020-06-11 NOTE — Telephone Encounter (Signed)
Confirmed with the pharmacy, pt picked up on 5/26

## 2020-06-11 NOTE — Telephone Encounter (Signed)
LM for pt to return call regarding rx. Both medications sent to the pharmacy yesterday

## 2020-07-05 ENCOUNTER — Other Ambulatory Visit: Payer: Self-pay | Admitting: Family Medicine

## 2020-07-12 ENCOUNTER — Other Ambulatory Visit: Payer: Self-pay | Admitting: Family Medicine

## 2020-07-13 NOTE — Telephone Encounter (Signed)
Requesting: Norco Contract:10/28/19 UDS:10/28/19 Last Visit: 05/17/20 Next Visit: 71mo f/u chronic pain; Next cpe 01/2021 Last Refill: 06/10/20(90,0)  Please Advise. Medication pending

## 2020-07-14 NOTE — Telephone Encounter (Signed)
Pt notified rx sent.

## 2020-07-21 DIAGNOSIS — M5416 Radiculopathy, lumbar region: Secondary | ICD-10-CM | POA: Diagnosis not present

## 2020-07-26 DIAGNOSIS — M5416 Radiculopathy, lumbar region: Secondary | ICD-10-CM | POA: Insufficient documentation

## 2020-07-30 ENCOUNTER — Encounter: Payer: Self-pay | Admitting: Family Medicine

## 2020-07-30 MED ORDER — METHOCARBAMOL 500 MG PO TABS: ORAL_TABLET | ORAL | 6 refills | Status: DC

## 2020-07-30 MED ORDER — TIZANIDINE HCL 4 MG PO TABS
ORAL_TABLET | ORAL | 1 refills | Status: DC
Start: 2020-07-30 — End: 2020-09-22

## 2020-07-30 NOTE — Telephone Encounter (Signed)
Xanax #180 with 1 RF was done 06/10/20 so I'm denying this current RF request. I'll do rx's for methocarbamol and tizanadine as requested.

## 2020-07-30 NOTE — Telephone Encounter (Signed)
Pt is requesting RF on 3 medications, methocarbamol, zanaflex, and alprazolam.  Requesting: methocarbamol Contract: 10/28/19 UDS:10/28/19 Last Visit: 05/17/20 Next Visit: F/u: 2mo f/u chronic pain Next cpe 01/2021 Last Refill: 06/25/19-07/29/19, ineffective   RF request for alprazolam LOV:05/17/20 Next ov: F/u: 70mo f/u chronic pain Next cpe 01/2021 Last written: 06/10/20(180,1)  RF request for tizanidine LOV: 05/17/20 Next ov: F/u: 17mo f/u chronic pain Next cpe 01/2021 Last written: 05/17/20(30,1)   Please Advise. Medications pending

## 2020-08-10 DIAGNOSIS — M5416 Radiculopathy, lumbar region: Secondary | ICD-10-CM | POA: Diagnosis not present

## 2020-08-13 ENCOUNTER — Other Ambulatory Visit: Payer: Self-pay | Admitting: Family Medicine

## 2020-08-16 ENCOUNTER — Encounter: Payer: Self-pay | Admitting: Family Medicine

## 2020-08-16 MED ORDER — HYDROCODONE-ACETAMINOPHEN 5-325 MG PO TABS
ORAL_TABLET | ORAL | 0 refills | Status: DC
Start: 2020-08-16 — End: 2020-09-22

## 2020-08-16 NOTE — Telephone Encounter (Signed)
Requesting: Norco Contract: 10/28/19 UDS: 10/28/19 Last Visit:04/06/20 Next Visit: N/A  Last Refill:07/14/20(90,0)  Please Advise. Med pending. Pt comment: Hi Dr. Ernestine Conrad.  I am requesting a refill on my Hydrocodone. Is possible can you call it in today. I had an epidural on the left side of my back on Tues and so far isn't helping.

## 2020-08-16 NOTE — Telephone Encounter (Signed)
Pt due for f/u chronic pain. I'll do rx for #90, must see prior to any further RFs.

## 2020-08-16 NOTE — Telephone Encounter (Signed)
RF request via pt has already been sent to PCP for approval

## 2020-08-17 DIAGNOSIS — M545 Low back pain, unspecified: Secondary | ICD-10-CM | POA: Diagnosis not present

## 2020-08-17 NOTE — Telephone Encounter (Signed)
Please deny RF, rx sent 08/16/20

## 2020-09-16 DIAGNOSIS — M5416 Radiculopathy, lumbar region: Secondary | ICD-10-CM | POA: Diagnosis not present

## 2020-09-21 ENCOUNTER — Other Ambulatory Visit: Payer: Self-pay | Admitting: Family Medicine

## 2020-09-22 ENCOUNTER — Other Ambulatory Visit: Payer: Self-pay | Admitting: Family Medicine

## 2020-09-22 ENCOUNTER — Encounter: Payer: Self-pay | Admitting: Family Medicine

## 2020-09-22 NOTE — Telephone Encounter (Signed)
Requesting: hydrocodone Contract:10/28/19 UDS:10/28/19  Last Visit:05/17/20 Next Visit:n/a Last Refill:08/16/20 (90,0)  Please Advise

## 2020-09-22 NOTE — Telephone Encounter (Signed)
Requesting: hydrocodone Contract:10/28/19 UDS:10/28/19  Last Visit:05/17/20 Next Visit:n/a Last Refill:08/16/20 (90,0)   Please Advise

## 2020-09-22 NOTE — Telephone Encounter (Signed)
OK rx's sent in but she needs f/u before any FURTHER RFs

## 2020-09-23 NOTE — Telephone Encounter (Signed)
Pt scheduled for 10/06/20

## 2020-10-06 ENCOUNTER — Encounter: Payer: Self-pay | Admitting: Family Medicine

## 2020-10-06 ENCOUNTER — Other Ambulatory Visit: Payer: Self-pay

## 2020-10-06 ENCOUNTER — Ambulatory Visit (INDEPENDENT_AMBULATORY_CARE_PROVIDER_SITE_OTHER): Payer: Medicare Other | Admitting: Family Medicine

## 2020-10-06 VITALS — BP 119/78 | HR 75 | Temp 97.8°F | Ht 62.99 in | Wt 157.2 lb

## 2020-10-06 DIAGNOSIS — Z1211 Encounter for screening for malignant neoplasm of colon: Secondary | ICD-10-CM | POA: Diagnosis not present

## 2020-10-06 DIAGNOSIS — M5442 Lumbago with sciatica, left side: Secondary | ICD-10-CM

## 2020-10-06 DIAGNOSIS — Z23 Encounter for immunization: Secondary | ICD-10-CM

## 2020-10-06 DIAGNOSIS — G894 Chronic pain syndrome: Secondary | ICD-10-CM | POA: Diagnosis not present

## 2020-10-06 DIAGNOSIS — Z79899 Other long term (current) drug therapy: Secondary | ICD-10-CM

## 2020-10-06 DIAGNOSIS — F411 Generalized anxiety disorder: Secondary | ICD-10-CM

## 2020-10-06 DIAGNOSIS — F3342 Major depressive disorder, recurrent, in full remission: Secondary | ICD-10-CM

## 2020-10-06 DIAGNOSIS — G8929 Other chronic pain: Secondary | ICD-10-CM

## 2020-10-06 MED ORDER — IBUPROFEN 600 MG PO TABS: 600.0000 mg | ORAL_TABLET | Freq: Three times a day (TID) | ORAL | 3 refills | Status: DC | PRN

## 2020-10-06 MED ORDER — PANTOPRAZOLE SODIUM 40 MG PO TBEC: DELAYED_RELEASE_TABLET | ORAL | 3 refills | Status: DC

## 2020-10-06 MED ORDER — TIZANIDINE HCL 4 MG PO TABS: ORAL_TABLET | ORAL | 1 refills | Status: DC

## 2020-10-06 MED ORDER — TRETINOIN 0.025 % EX CREA: 1.0000 "application " | TOPICAL_CREAM | Freq: Every evening | CUTANEOUS | 3 refills | Status: DC | PRN

## 2020-10-06 MED ORDER — HYDROCODONE-ACETAMINOPHEN 5-325 MG PO TABS: ORAL_TABLET | ORAL | 0 refills | Status: DC

## 2020-10-06 MED ORDER — ELETRIPTAN HYDROBROMIDE 40 MG PO TABS: ORAL_TABLET | ORAL | 1 refills | Status: DC

## 2020-10-06 NOTE — Progress Notes (Addendum)
OFFICE VISIT  10/06/2020  CC:  Chief Complaint  Patient presents with   Medication Refill   HPI:    Patient is a 60 y.o. Caucasian female who presents for f/u chronic pain syndrome and GAD. A/P as of last visit: "1) Chronic pain syndrome: stable except a bit more myofascial pain in mid/upper back recently. She is going to try a soft back support as well as heat application. Cont celebrex 200 mg bid prn. Cont vicodin 5/325, 1 tid prn, #90-->rx sent in today. CSC and UDS UTD.   2) GAD: stable. Cont citalopram 40mg  qd->eRx'd today. Continue xanax 1mg , 1 bid prn-->new rx NOT needed for this med today. CSC and UDS UTD."  INTERIM HX: Constant LBP, rad down L leg.  Very debilitated, spends a lot of time in bed. She has required 3 ESI's since I saw her last, most recent 3 wks ago, none helped. Rpt MRI a few weeks ago, no results avail to me, pt says "lower part of my spine pinching my nerves" suspect she means spinal stenosis.  She has appt with Dr. Melina Schools to see if any surgical options appropriate.  Her anxiety/mood have been stable despite all the worsening of her pain problem. She gets out to [place] to see her mother in law regularly.  Taking citalopram daily and alpraz regularly although not as much as she was when taking care of mother in law in her home.  Indication for chronic opioid: neck, mid back, low back, HA's, myofascial pain, myalgias of appendicular skeleton.  Some days finds it hard to get out of bed and function.   She carries dx of DDD of C and L spine, significant scoliosis, myofascial pain syndrome with trigger points and recurrent trochanteric bursitis.  Has seen a handful of specialists in the past (spine and scoliosis center in [place], [facility], neurologist, in [place], also HA and wellness center in [place]).  She has never been managed by a pain mgmt specialist.   Medication and dose: vicodin 5/325, 1 tid prn. PMP AWARE reviewed today: most recent rx for vicodin was filled  09/22/20, # 82, rx by me. Most recent alprazolam rx filled 09/21/20, #180. No red flags.  Past Medical History:  Diagnosis Date   Adult ADHD    Anxiety    Aortic atherosclerosis ([place]) 08/2016   Chronic back pain    neck, too.  MRI L spine 05/2008: 40 degrees dextroscoliosis due to a hemivertebra of L3.  Some DDD/spondylosis w/out spinal stenosis.   Depression    Diastolic dysfunction, left ventricle 05/2018   Echo with grd I DD.   Gout    2 attacks total as of 12/2014.   Kidney stones 2005; 2017; 2018   Most recent CT 08/2016 showed 3 mm nonobstructing L nephrolithiasis.   Migraine    Proph tx with topamax helps, abortive tx with relpax helps.  Couldn't afford topamax so d/c'd this 04/2016.     Navicular fracture, foot 03/2018   Right Navicular fx-nonunion (surgery)   Pulmonary nodule 2018   5 mm LLL, stable on 2 yr f/u imaging 2022-->benign, no further imaging needed.   Restless legs syndrome    Pt can get to sleep fine so she declines med for this as of 08/2016    Past Surgical History:  Procedure Laterality Date   BREAST SURGERY  1999   reduction   ENDOMETRIAL ABLATION  2002   FOOT FRACTURE SURGERY  03/2018   Closed displaced fracture of right  navicular, nonunion.   KIDNEY STONE SURGERY  2005   LAPAROSCOPIC VAGINAL HYSTERECTOMY  12/02/07   for DUB   LUMBAR ESI  2021   no help ([facility])   REDUCTION MAMMAPLASTY     TRANSTHORACIC ECHOCARDIOGRAM  06/13/2018   Grd I DD    Outpatient Medications Prior to Visit  Medication Sig Dispense Refill   albuterol (VENTOLIN HFA) 108 (90 Base) MCG/ACT inhaler Inhale 2 puffs into the lungs every 4 (four) hours as needed for wheezing or shortness of breath. 18 g 0   ALPRAZolam (XANAX) 1 MG tablet TAKE ONE TABLET BY MOUTH TWICE A DAY AS NEEDED FOR ANXIETY 180 tablet 1   Biotin 10000 MCG TABS Take 1 tablet by mouth daily.     citalopram (CELEXA) 40 MG tablet Take 1 tablet (40 mg total) by mouth daily. 90 tablet 3   colchicine 0.6 MG tablet 2  tabs po at onset of gout pain, then 1 tab po one hour later 15 tablet 2   Cyanocobalamin (VITAMIN B-12 PO) Take 1 tablet by mouth daily. Reported on 04/07/2015     fluorouracil (EFUDEX) 5 % cream Apply to areas of sun damage on the face twice daily for 4-5 days     lidocaine (LIDODERM) 5 % 1 patch to the affected area q12h prn 30 patch 11   methocarbamol (ROBAXIN) 500 MG tablet TAKE 1 OR 2 TABLETS BY MOUTH EVERY 6 HOURS AS NEEDED FOR MUSCULAR RELAXATION 30 tablet 6   celecoxib (CELEBREX) 200 MG capsule TAKE ONE CAPSULE BY MOUTH TWICE A DAY (EVERY 12 HOURS) AS NEEDED 60 capsule 6   eletriptan (RELPAX) 40 MG tablet TAKE 1 TABLET BY MOUTH AT ONSET OF MIGRAINE HEADHACHE MAY REPEAT IN 2 HOURS IF HEADACHE PERISTS OR RECURS 18 tablet 1   HYDROcodone-acetaminophen (NORCO/VICODIN) 5-325 MG tablet TAKE ONE TABLET BY MOUTH THREE TIMES DAILY AS NEEDED 90 tablet 0   pantoprazole (PROTONIX) 40 MG tablet TAKE ONE (1) TABLET BY MOUTH EVERY DAY 90 tablet 3   tiZANidine (ZANAFLEX) 4 MG tablet TAKE ONE TABLET (4MG  TOTAL) BY MOUTH EVERY SIX HOURS AS NEEDED FOR MUSCLE SPASMS 30 tablet 0   tretinoin (RETIN-A) 0.025 % cream Apply 1 application topically at bedtime as needed. For acne prevention/control 45 g 3   No facility-administered medications prior to visit.    Allergies  Allergen Reactions   Imitrex [Sumatriptan] Other (See Comments)    Face and Arm numbness    ROS As per HPI  PE: Vitals with BMI 10/06/2020 04/06/2020 03/31/2020  Height 5' 2.992" 5\' 3"  -  Weight 157 lbs 3 oz 158 lbs -  BMI 81.44 28 -  Systolic 818 563 149  Diastolic 78 70 86  Pulse 75 62 70   Gen: Alert, well appearing.  Patient is oriented to person, place, time, and situation. AFFECT: pleasant, lucid thought and speech. No further exam today.  LABS:  Lab Results  Component Value Date   TSH 1.11 10/28/2019   Lab Results  Component Value Date   WBC 5.6 03/31/2020   HGB 14.7 03/31/2020   HCT 43.6 03/31/2020   MCV 88.8  03/31/2020   PLT 227 03/31/2020   Lab Results  Component Value Date   CREATININE 0.74 03/31/2020   BUN 8 03/31/2020   NA 140 03/31/2020   K 3.9 03/31/2020   CL 103 03/31/2020   CO2 28 03/31/2020   Lab Results  Component Value Date   ALT 16 03/31/2020   AST 23  03/31/2020   ALKPHOS 64 03/31/2020   BILITOT 0.4 03/31/2020   Lab Results  Component Value Date   CHOL 195 10/28/2019   Lab Results  Component Value Date   HDL 44.00 10/28/2019   Lab Results  Component Value Date   LDLCALC 130 (H) 10/28/2019   Lab Results  Component Value Date   TRIG 105.0 10/28/2019   Lab Results  Component Value Date   CHOLHDL 4 10/28/2019   IMPRESSION AND PLAN:  1) Chronic low back pain, with radiculopathy.  She describes spinal stenosis dx by MRI recently. Recent ESI x 3 no help. Has appt to discuss options with surgeon soon. She still gets improvement in pain level and increase in functionality and quality of life with tid use of vicodin. I did electronic rx's for this med today for October.  Appropriate fill on/after date was noted on each rx. UDS today.  CSC UTD but needs renewal at next f/u in 3 mo.  2) Recurrent MDD, GAD: stable. Cont citalopram 40 qd and alpraz 1mg  bid prn.  No new rx needed today.  3) Preventative health care: Shingrix #1 and flu vaccine today. She requested iFOB today instead of the cologuard that she was sent earlier.  An After Visit Summary was printed and given to the patient.  FOLLOW UP: Return in about 3 months (around 01/05/2021) for annual CPE (fasting).+f/u chronic pain  Signed:  Crissie Sickles, MD           10/06/2020

## 2020-10-06 NOTE — Addendum Note (Signed)
Addended by: Baron Hamper on: 10/06/2020 12:13 PM   Modules accepted: Orders

## 2020-10-12 DIAGNOSIS — M545 Low back pain, unspecified: Secondary | ICD-10-CM | POA: Diagnosis not present

## 2020-10-14 LAB — DRUG MONITORING PANEL 376104, URINE
Alphahydroxyalprazolam: 369 ng/mL — ABNORMAL HIGH (ref <25)
Alphahydroxymidazolam: NEGATIVE ng/mL (ref <50)
Alphahydroxytriazolam: NEGATIVE ng/mL (ref <50)
Aminoclonazepam: NEGATIVE ng/mL (ref <25)
Amphetamines: NEGATIVE ng/mL (ref <500)
Barbiturates: NEGATIVE ng/mL (ref <300)
Benzodiazepines: POSITIVE ng/mL — AB (ref <100)
Cocaine Metabolite: NEGATIVE ng/mL (ref <150)
Codeine: NEGATIVE ng/mL (ref <50)
Desmethyltramadol: NEGATIVE ng/mL (ref <100)
Hydrocodone: 7740 ng/mL — ABNORMAL HIGH (ref <50)
Hydromorphone: 830 ng/mL — ABNORMAL HIGH (ref <50)
Hydroxyethylflurazepam: NEGATIVE ng/mL (ref <50)
Lorazepam: NEGATIVE ng/mL (ref <50)
Morphine: NEGATIVE ng/mL (ref <50)
Nordiazepam: NEGATIVE ng/mL (ref <50)
Norhydrocodone: >10000 ng/mL — ABNORMAL HIGH (ref <50)
Opiates: POSITIVE ng/mL — AB (ref <100)
Oxazepam: NEGATIVE ng/mL (ref <50)
Oxycodone: NEGATIVE ng/mL (ref <100)
Temazepam: NEGATIVE ng/mL (ref <50)
Tramadol: NEGATIVE ng/mL (ref <100)

## 2020-10-14 LAB — DM TEMPLATE

## 2020-10-18 ENCOUNTER — Other Ambulatory Visit: Payer: Self-pay | Admitting: Family Medicine

## 2020-10-25 ENCOUNTER — Other Ambulatory Visit: Payer: Self-pay | Admitting: Family Medicine

## 2020-10-27 DIAGNOSIS — S93491A Sprain of other ligament of right ankle, initial encounter: Secondary | ICD-10-CM | POA: Diagnosis not present

## 2020-10-27 DIAGNOSIS — M25572 Pain in left ankle and joints of left foot: Secondary | ICD-10-CM | POA: Diagnosis not present

## 2020-10-27 DIAGNOSIS — M25571 Pain in right ankle and joints of right foot: Secondary | ICD-10-CM | POA: Diagnosis not present

## 2020-11-26 ENCOUNTER — Other Ambulatory Visit: Payer: Self-pay | Admitting: Family Medicine

## 2020-11-26 NOTE — Telephone Encounter (Signed)
Requesting: [place]: 11/06/19 UDS: 10/06/20 Last Visit:10/06/20 Next Visit: advised to f/u Dec Last Refill: 10/06/20(90,0)  Please Advise. Medication pending

## 2020-12-16 ENCOUNTER — Other Ambulatory Visit: Payer: Self-pay | Admitting: Family Medicine

## 2020-12-16 NOTE — Telephone Encounter (Signed)
Nothing further needed 

## 2020-12-21 ENCOUNTER — Other Ambulatory Visit: Payer: Self-pay | Admitting: Family Medicine

## 2020-12-21 NOTE — Telephone Encounter (Signed)
Requesting: alprazolam Contract: 11/06/19 UDS: 10/06/20 Last Visit: 10/06/20 Next Visit: advised to f/u 3 months Last Refill: 06/10/20(180,1)  Please Advise. Med pending

## 2020-12-22 NOTE — Telephone Encounter (Signed)
read

## 2020-12-22 NOTE — Telephone Encounter (Signed)
Pt was sent MyChart message advising refill sent and appt needed. MyChart message sent.

## 2020-12-31 ENCOUNTER — Other Ambulatory Visit: Payer: Self-pay | Admitting: Family Medicine

## 2021-01-03 NOTE — Telephone Encounter (Signed)
Requesting: NORCO Contract: 11/06/19 UDS: 10/06/20 Last Visit:10/06/20 Next Visit: advised to f/u CPE Dec Last Refill: 11/28/20(90,0)  Please Advise. Med pending

## 2021-01-03 NOTE — Telephone Encounter (Signed)
Rx sent. Pt due for f/u.

## 2021-01-04 NOTE — Telephone Encounter (Signed)
Pt scheduled for 01/24/21

## 2021-01-24 ENCOUNTER — Encounter: Payer: Self-pay | Admitting: Family Medicine

## 2021-01-24 ENCOUNTER — Ambulatory Visit (INDEPENDENT_AMBULATORY_CARE_PROVIDER_SITE_OTHER): Payer: Medicare Other | Admitting: Family Medicine

## 2021-01-24 ENCOUNTER — Other Ambulatory Visit: Payer: Self-pay

## 2021-01-24 VITALS — BP 122/82 | HR 60 | Temp 97.6°F | Ht 62.99 in | Wt 149.8 lb

## 2021-01-24 DIAGNOSIS — F3342 Major depressive disorder, recurrent, in full remission: Secondary | ICD-10-CM

## 2021-01-24 DIAGNOSIS — Z0000 Encounter for general adult medical examination without abnormal findings: Secondary | ICD-10-CM

## 2021-01-24 DIAGNOSIS — M47816 Spondylosis without myelopathy or radiculopathy, lumbar region: Secondary | ICD-10-CM

## 2021-01-24 DIAGNOSIS — E7800 Pure hypercholesterolemia, unspecified: Secondary | ICD-10-CM | POA: Diagnosis not present

## 2021-01-24 DIAGNOSIS — Z1231 Encounter for screening mammogram for malignant neoplasm of breast: Secondary | ICD-10-CM | POA: Diagnosis not present

## 2021-01-24 DIAGNOSIS — M7918 Myalgia, other site: Secondary | ICD-10-CM | POA: Diagnosis not present

## 2021-01-24 DIAGNOSIS — F411 Generalized anxiety disorder: Secondary | ICD-10-CM

## 2021-01-24 DIAGNOSIS — G894 Chronic pain syndrome: Secondary | ICD-10-CM | POA: Diagnosis not present

## 2021-01-24 DIAGNOSIS — Z23 Encounter for immunization: Secondary | ICD-10-CM | POA: Diagnosis not present

## 2021-01-24 LAB — CBC WITH DIFFERENTIAL/PLATELET
Basophils Absolute: 0 10*3/uL (ref 0.0–0.1)
Basophils Relative: 0.4 % (ref 0.0–3.0)
Eosinophils Absolute: 0 10*3/uL (ref 0.0–0.7)
Eosinophils Relative: 0.2 % (ref 0.0–5.0)
HCT: 42.8 % (ref 36.0–46.0)
Hemoglobin: 14.1 g/dL (ref 12.0–15.0)
Lymphocytes Relative: 54.2 % — ABNORMAL HIGH (ref 12.0–46.0)
Lymphs Abs: 1.7 10*3/uL (ref 0.7–4.0)
MCHC: 32.9 g/dL (ref 30.0–36.0)
MCV: 88.5 fl (ref 78.0–100.0)
Monocytes Absolute: 0.2 10*3/uL (ref 0.1–1.0)
Monocytes Relative: 8 % (ref 3.0–12.0)
Neutro Abs: 1.1 10*3/uL — ABNORMAL LOW (ref 1.4–7.7)
Neutrophils Relative %: 37.2 % — ABNORMAL LOW (ref 43.0–77.0)
Platelets: 167 10*3/uL (ref 150.0–400.0)
RBC: 4.84 Mil/uL (ref 3.87–5.11)
RDW: 12.6 % (ref 11.5–15.5)
WBC: 3.1 10*3/uL — ABNORMAL LOW (ref 4.0–10.5)

## 2021-01-24 LAB — COMPREHENSIVE METABOLIC PANEL
ALT: 14 U/L (ref 0–35)
AST: 21 U/L (ref 0–37)
Albumin: 4.3 g/dL (ref 3.5–5.2)
Alkaline Phosphatase: 60 U/L (ref 39–117)
BUN: 5 mg/dL — ABNORMAL LOW (ref 6–23)
CO2: 29 mEq/L (ref 19–32)
Calcium: 9.1 mg/dL (ref 8.4–10.5)
Chloride: 104 mEq/L (ref 96–112)
Creatinine, Ser: 0.8 mg/dL (ref 0.40–1.20)
GFR: 81.12 mL/min (ref >60.00)
Glucose, Bld: 82 mg/dL (ref 70–99)
Potassium: 4.1 mEq/L (ref 3.5–5.1)
Sodium: 139 mEq/L (ref 135–145)
Total Bilirubin: 0.4 mg/dL (ref 0.2–1.2)
Total Protein: 6.6 g/dL (ref 6.0–8.3)

## 2021-01-24 LAB — LIPID PANEL
Cholesterol: 152 mg/dL (ref 0–200)
HDL: 41 mg/dL (ref >39.00)
LDL Cholesterol: 81 mg/dL (ref 0–99)
NonHDL: 111.03
Total CHOL/HDL Ratio: 4
Triglycerides: 148 mg/dL (ref 0.0–149.0)
VLDL: 29.6 mg/dL (ref 0.0–40.0)

## 2021-01-24 LAB — TSH: TSH: 0.87 u[IU]/mL (ref 0.35–5.50)

## 2021-01-24 NOTE — Patient Instructions (Signed)

## 2021-01-24 NOTE — Progress Notes (Signed)
Office Note 01/24/2021  CC:  Chief Complaint  Patient presents with   Follow-up    RCI; not fasting    HPI:  Patient is a 61 y.o. female who is here for annual health maintenance exam and 3 mo f/u chronic pain syndrome, recurrent MDD, and GAD. A/P as of last visit: "1) Chronic low back pain, with radiculopathy.  She describes spinal stenosis dx by MRI recently. Recent ESI x 3 no help. Has appt to discuss options with surgeon soon. She still gets improvement in pain level and increase in functionality and quality of life with tid use of vicodin. I did electronic rx's for this med today for October.  Appropriate fill on/after date was noted on each rx. UDS today.  CSC UTD but needs renewal at next f/u in 3 mo.   2) Recurrent MDD, GAD: stable. Cont citalopram 40 qd and alpraz 1mg  bid prn.  No new rx needed today.   3) Preventative health care: Shingrix #1 and flu vaccine today. She requested iFOB today instead of the cologuard that she was sent earlier."  INTERIM HX: Overall Jacqueline Vance is doing pretty good.  Her low back pain gradually got better after I saw her last.  Still with some intermittent severe twinge in the left SI joint region.  She did go to the back surgeon and he recommended water therapy.  She says she never got called to do this. She continues to take 1 hydrocodone tab 3 times a day.  Also taking Robaxin regularly and Zanaflex on a as needed basis.  Indication for chronic opioid: neck, mid back, low back, HA's, myofascial pain, arthralgias of appendicular skeleton.  Some days finds it hard to get out of bed and function.   She carries dx of DDD of C and L spine, significant scoliosis, myofascial pain syndrome with trigger points and recurrent trochanteric bursitis.  Has seen a handful of specialists in the past (spine and scoliosis center in [place], [facility], neurologist, in [place], also [facility] center in [place]).  She has never been managed by a pain mgmt specialist.    Medication and dose: vicodin 5/325, 1 tid prn. PMP AWARE reviewed today: most recent rx for vicodin was filled 01/04/21, # 40, rx by me. No red flags.  Chronic anxiety: Long-term management with citalopram 40/day and alprazolam 1 mg twice daily has been pretty effective.  She has some raw emotions today, primarily sadness--her brother was living with her for a while and she had asked him to leave because he was not getting along with her son.  On a good note, her son continues to abstain from drugs and is going to enter a [employer] college soon. Overall, though, she has maintained pretty stable good mood with no significant prolonged dips.  Anxiety levels pretty stable.  Most recent alprazolam rx filled 12/22/20, #180, rx by me. No red flags.   Past Medical History:  Diagnosis Date   Adult ADHD    Anxiety    Aortic atherosclerosis ([place]) 08/2016   Chronic back pain    neck, too.  MRI L spine 05/2008: 40 degrees dextroscoliosis due to a hemivertebra of L3.  Some DDD/spondylosis w/out spinal stenosis.   Depression    Diastolic dysfunction, left ventricle 05/2018   Echo with grd I DD.   Gout    2 attacks total as of 12/2014.   Kidney stones 2005; 2017; 2018   Most recent CT 08/2016 showed 3 mm nonobstructing L nephrolithiasis.  Migraine    Proph tx with topamax helps, abortive tx with relpax helps.  Couldn't afford topamax so d/c'd this 04/2016.     Navicular fracture, foot 03/2018   Right Navicular fx-nonunion (surgery)   Pulmonary nodule 2018   5 mm LLL, stable on 2 yr f/u imaging 2022-->benign, no further imaging needed.   Restless legs syndrome    Pt can get to sleep fine so she declines med for this as of 08/2016    Past Surgical History:  Procedure Laterality Date   BREAST SURGERY  1999   reduction   ENDOMETRIAL ABLATION  2002   FOOT FRACTURE SURGERY  03/2018   Closed displaced fracture of right navicular, nonunion.   KIDNEY STONE SURGERY  2005   LAPAROSCOPIC VAGINAL  HYSTERECTOMY  12/02/07   for DUB   LUMBAR ESI  2021   no help ([place])   REDUCTION MAMMAPLASTY     TRANSTHORACIC ECHOCARDIOGRAM  06/13/2018   Grd I DD    Family History  Problem Relation Age of Onset   Cancer Mother        brain ca (glioblastoma)   Breast cancer Paternal Aunt    Breast cancer Maternal Grandmother     Social History   Socioeconomic History   Marital status: Married    Spouse name: Not on file   Number of children: Not on file   Years of education: Not on file   Highest education level: Not on file  Occupational History   Not on file  Tobacco Use   Smoking status: Never   Smokeless tobacco: Never  Vaping Use   Vaping Use: Never used  Substance and Sexual Activity   Alcohol use: No   Drug use: No   Sexual activity: Not on file  Other Topics Concern   Not on file  Social History Narrative   Married, one adult son.  Lives in [place], [place].   Works for [employer] in [place].    Exercise: walks 3 miles per day.   No T/A/Ds.               Social Determinants of Health   Financial Resource Strain: Not on file  Food Insecurity: Not on file  Transportation Needs: Not on file  Physical Activity: Not on file  Stress: Not on file  Social Connections: Not on file  Intimate Partner Violence: Not on file    Outpatient Medications Prior to Visit  Medication Sig Dispense Refill   albuterol (VENTOLIN HFA) 108 (90 Base) MCG/ACT inhaler Inhale 2 puffs into the lungs every 4 (four) hours as needed for wheezing or shortness of breath. 18 g 0   ALPRAZolam (XANAX) 1 MG tablet TAKE ONE TABLET BY MOUTH TWICE A DAY AS NEEDED FOR ANXIETY 180 tablet 0   Biotin 10000 MCG TABS Take 1 tablet by mouth daily.     citalopram (CELEXA) 40 MG tablet Take 1 tablet (40 mg total) by mouth daily. 90 tablet 3   Cyanocobalamin (VITAMIN B-12 PO) Take 1 tablet by mouth daily. Reported on 04/07/2015     eletriptan (RELPAX) 40 MG tablet TAKE 1 TABLET BY MOUTH AT  ONSET OF MIGRAINE HEADACHE. MAY REPEAT IN 2 HOURS IF HEADACHE PERSISTS OR RECURS 18 tablet 0   HYDROcodone-acetaminophen (NORCO/VICODIN) 5-325 MG tablet TAKE ONE TABLET BY MOUTH THREE TIMES DAILY AS NEEDED. 90 tablet 0   ibuprofen (ADVIL) 600 MG tablet Take 1 tablet (600 mg total) by mouth every 8 (eight) hours as  needed. 30 tablet 3   lidocaine (LIDODERM) 5 % 1 patch to the affected area q12h prn 30 patch 11   methocarbamol (ROBAXIN) 500 MG tablet TAKE 1 OR 2 TABLETS BY MOUTH EVERY 6 HOURS AS NEEDED FOR MUSCULAR RELAXATION 30 tablet 6   pantoprazole (PROTONIX) 40 MG tablet TAKE ONE (1) TABLET BY MOUTH EVERY DAY 90 tablet 3   tiZANidine (ZANAFLEX) 4 MG tablet 1 tab po qhs prn 90 tablet 1   tretinoin (RETIN-A) 0.025 % cream Apply 1 application topically at bedtime as needed. For acne prevention/control 45 g 3   colchicine 0.6 MG tablet 2 tabs po at onset of gout pain, then 1 tab po one hour later (Patient not taking: Reported on 01/24/2021) 15 tablet 2   fluorouracil (EFUDEX) 5 % cream Apply to areas of sun damage on the face twice daily for 4-5 days (Patient not taking: Reported on 01/24/2021)     No facility-administered medications prior to visit.    Allergies  Allergen Reactions   Imitrex [Sumatriptan] Other (See Comments)    Face and Arm numbness    ROS Review of Systems  Constitutional:  Negative for appetite change, chills, fatigue and fever.  HENT:  Negative for congestion, dental problem, ear pain and sore throat.   Eyes:  Negative for discharge, redness and visual disturbance.  Respiratory:  Negative for cough, chest tightness, shortness of breath and wheezing.   Cardiovascular:  Negative for chest pain, palpitations and leg swelling.  Gastrointestinal:  Negative for abdominal pain, blood in stool, diarrhea, nausea and vomiting.  Genitourinary:  Negative for difficulty urinating, dysuria, flank pain, frequency, hematuria and urgency.  Musculoskeletal:  Positive for back pain  (chronic LBP). Negative for arthralgias, joint swelling, myalgias and neck stiffness.  Skin:  Negative for pallor and rash.  Neurological:  Negative for dizziness, speech difficulty, weakness and headaches.  Hematological:  Negative for adenopathy. Does not bruise/bleed easily.  Psychiatric/Behavioral:  Negative for confusion and sleep disturbance. The patient is not nervous/anxious.    PE; Vitals with BMI 01/24/2021 10/06/2020 04/06/2020  Height 5' 2.99" 5' 2.992" 5\' 3"   Weight 149 lbs 13 oz 157 lbs 3 oz 158 lbs  BMI 29.56 21.30 28  Systolic 865 784 696  Diastolic 82 78 70  Pulse 60 75 62   Exam chaperoned by Kavin Leech, CMA. Gen: Alert, well appearing.  Patient is oriented to person, place, time, and situation. AFFECT: pleasant, lucid thought and speech. ENT: Ears: EACs clear, normal epithelium.  TMs with good light reflex and landmarks bilaterally.  Eyes: no injection, icteris, swelling, or exudate.  EOMI, PERRLA. Nose: no drainage or turbinate edema/swelling.  No injection or focal lesion.  Mouth: lips without lesion/swelling.  Oral mucosa pink and moist.  Dentition intact and without obvious caries or gingival swelling.  Oropharynx without erythema, exudate, or swelling.  Neck: supple/nontender.  No LAD, mass, or TM.  Carotid pulses 2+ bilaterally, without bruits. CV: RRR, no m/r/g.   LUNGS: CTA bilat, nonlabored resps, good aeration in all lung fields. ABD: soft, NT, ND, BS normal.  No hepatospenomegaly or mass.  No bruits. EXT: no clubbing, cyanosis, or edema.  Musculoskeletal: no joint swelling, erythema, warmth, or tenderness.  ROM of all joints intact. Skin - no sores or suspicious lesions or rashes or color changes  Pertinent labs:  Lab Results  Component Value Date   TSH 1.11 10/28/2019   Lab Results  Component Value Date   WBC 5.6 03/31/2020   HGB  14.7 03/31/2020   HCT 43.6 03/31/2020   MCV 88.8 03/31/2020   PLT 227 03/31/2020   Lab Results  Component Value Date    CREATININE 0.74 03/31/2020   BUN 8 03/31/2020   NA 140 03/31/2020   K 3.9 03/31/2020   CL 103 03/31/2020   CO2 28 03/31/2020   Lab Results  Component Value Date   ALT 16 03/31/2020   AST 23 03/31/2020   ALKPHOS 64 03/31/2020   BILITOT 0.4 03/31/2020   Lab Results  Component Value Date   CHOL 195 10/28/2019   Lab Results  Component Value Date   HDL 44.00 10/28/2019   Lab Results  Component Value Date   LDLCALC 130 (H) 10/28/2019   Lab Results  Component Value Date   TRIG 105.0 10/28/2019   Lab Results  Component Value Date   CHOLHDL 4 10/28/2019   No results found for: HGBA1C  ASSESSMENT AND PLAN:   #1 chronic pain syndrome.  Primarily the issue is her low back. Stable.  Continue Vicodin 5-3 25, 1 tab 3 times daily, 90/month.  No new prescription needed today. UDS utd 09/2020, appropriate results. CSC UTD.  #2 recurrent major depressive disorder, in remission.  GAD, doing pretty well. Continue citalopram 40 mg a day and alprazolam 1 mg twice a day as needed. Controlled substance contract updated today. Urine drug screen with appropriate results September 2022.  #3 Health maintenance exam: Reviewed age and gender appropriate health maintenance issues (prudent diet, regular exercise, health risks of tobacco and excessive alcohol, use of seatbelts, fire alarms in home, use of sunscreen).  Also reviewed age and gender appropriate health screening as well as vaccine recommendations. Vaccines: Shingrix #2-->given today.  Otherwise UTD. LABS: health panel ordered today---she had a poptart this morning. Cervical ca screening: hx of hysterectomy for benign dx-->no further pelvics/paps indicated. Breast ca screening: normal mammogram 07/03/19. Repeat overdue-->ordered. Colon ca screening: Sept 2022 iFOB ordered, not returned yet.  An After Visit Summary was printed and given to the patient.  FOLLOW UP:  Return in about 3 months (around 04/24/2021) for f/u chronic  pain/high risk med.  Signed:  Crissie Sickles, MD           01/24/2021

## 2021-01-29 ENCOUNTER — Other Ambulatory Visit: Payer: Self-pay | Admitting: Family Medicine

## 2021-02-28 ENCOUNTER — Other Ambulatory Visit: Payer: Self-pay | Admitting: Family Medicine

## 2021-03-01 NOTE — Telephone Encounter (Signed)
Requesting: norco Contract: 11/06/19 UDS:10/06/20 Last Visit: 01/24/21 Next Visit: advised to f/u 3 months Last Refill: 01/03/21(90,0)  Please Advise. Med pending

## 2021-03-02 NOTE — Telephone Encounter (Signed)
Pt advised refill sent. °

## 2021-03-10 ENCOUNTER — Encounter: Payer: Self-pay | Admitting: Family Medicine

## 2021-03-11 ENCOUNTER — Other Ambulatory Visit: Payer: Self-pay | Admitting: Family Medicine

## 2021-03-11 DIAGNOSIS — H1089 Other conjunctivitis: Secondary | ICD-10-CM | POA: Diagnosis not present

## 2021-03-16 DIAGNOSIS — N289 Disorder of kidney and ureter, unspecified: Secondary | ICD-10-CM

## 2021-03-16 HISTORY — DX: Disorder of kidney and ureter, unspecified: N28.9

## 2021-03-18 DIAGNOSIS — Z1389 Encounter for screening for other disorder: Secondary | ICD-10-CM | POA: Diagnosis not present

## 2021-03-22 ENCOUNTER — Ambulatory Visit (INDEPENDENT_AMBULATORY_CARE_PROVIDER_SITE_OTHER): Payer: Medicare Other

## 2021-03-22 ENCOUNTER — Other Ambulatory Visit: Payer: Self-pay

## 2021-03-22 ENCOUNTER — Other Ambulatory Visit: Payer: Self-pay | Admitting: Family Medicine

## 2021-03-22 DIAGNOSIS — Z0000 Encounter for general adult medical examination without abnormal findings: Secondary | ICD-10-CM

## 2021-03-22 DIAGNOSIS — Z1211 Encounter for screening for malignant neoplasm of colon: Secondary | ICD-10-CM

## 2021-03-22 NOTE — Progress Notes (Addendum)
Subjective:   Jacqueline Vance is a 61 y.o. female who presents for Medicare Annual (Subsequent) preventive examination.  Review of Systems    I connected with  Jacqueline Vance on 03/22/21 by an audio only telemedicine application and verified that I am speaking with the correct person using two identifiers.   I discussed the limitations, risks, security and privacy concerns of performing an evaluation and management service by telephone and the availability of in person appointments. I also discussed with the patient that there may be a patient responsible charge related to this service. The patient expressed understanding and verbally consented to this telephonic visit.  Location of Patient: home Location of Provider: office  List any persons and their role that are participating in the visit with the patient.   Jacqueline Vance Cardiac Risk Factors include: none     Objective:    There were no vitals filed for this visit. There is no height or weight on file to calculate BMI.  Advanced Directives 03/22/2021 03/31/2020 04/26/2019 08/21/2016  Does Patient Have a Medical Advance Directive? No No No No  Would patient like information on creating a medical advance directive? No - Patient declined - - -    Current Medications (verified) Outpatient Encounter Medications as of 03/22/2021  Medication Sig   albuterol (VENTOLIN HFA) 108 (90 Base) MCG/ACT inhaler Inhale 2 puffs into the lungs every 4 (four) hours as needed for wheezing or shortness of breath.   ALPRAZolam (XANAX) 1 MG tablet TAKE ONE TABLET BY MOUTH TWICE A DAY AS NEEDED FOR ANXIETY   Biotin 10000 MCG TABS Take 1 tablet by mouth daily.   citalopram (CELEXA) 40 MG tablet Take 1 tablet (40 mg total) by mouth daily.   colchicine 0.6 MG tablet 2 tabs po at onset of gout pain, then 1 tab po one hour later (Patient not taking: Reported on 01/24/2021)   Cyanocobalamin (VITAMIN B-12 PO) Take 1 tablet by mouth daily. Reported on  04/07/2015   eletriptan (RELPAX) 40 MG tablet TAKE 1 TABLET BY MOUTH AT ONSET OF MIGRAINE HEADACHE. MAY REPEAT IN 2 HOURS IF HEADACHE PERSISTS OR RECURS   fluorouracil (EFUDEX) 5 % cream Apply to areas of sun damage on the face twice daily for 4-5 days (Patient not taking: Reported on 01/24/2021)   HYDROcodone-acetaminophen (NORCO/VICODIN) 5-325 MG tablet TAKE ONE TABLET BY MOUTH THREE TIMES DAILY AS NEEDED.   ibuprofen (ADVIL) 600 MG tablet TAKE ONE TABLET (600 MG TOTAL) BY MOUTH EVERY 8 HOURS AS NEEDED.   lidocaine (LIDODERM) 5 % 1 patch to the affected area q12h prn   methocarbamol (ROBAXIN) 500 MG tablet TAKE 1 OR 2 TABLETS BY MOUTH EVERY 6 HOURS AS NEEDED FOR MUSCULAR RELAXATION   pantoprazole (PROTONIX) 40 MG tablet TAKE ONE (1) TABLET BY MOUTH EVERY DAY   tiZANidine (ZANAFLEX) 4 MG tablet 1 tab po qhs prn   tretinoin (RETIN-A) 0.025 % cream Apply 1 application topically at bedtime as needed. For acne prevention/control   No facility-administered encounter medications on file as of 03/22/2021.    Allergies (verified) Imitrex [sumatriptan]   History: Past Medical History:  Diagnosis Date   Adult ADHD    Anxiety    Aortic atherosclerosis ([place]) 08/2016   Chronic back pain    neck, too.  MRI L spine 05/2008: 40 degrees dextroscoliosis due to a hemivertebra of L3.  Some DDD/spondylosis w/out spinal stenosis.   Depression    Diastolic dysfunction, left ventricle 05/2018  Echo with grd I DD.   Gout    2 attacks total as of 12/2014.   Kidney stones 2005; 2017; 2018   Most recent CT 08/2016 showed 3 mm nonobstructing L nephrolithiasis.   Migraine    Proph tx with topamax helps, abortive tx with relpax helps.  Couldn't afford topamax so d/c'd this 04/2016.     Navicular fracture, foot 03/2018   Right Navicular fx-nonunion (surgery)   Pulmonary nodule 2018   5 mm LLL, stable on 2 yr f/u imaging 2022-->benign, no further imaging needed.   Restless legs syndrome    Pt can get to sleep fine  so she declines med for this as of 08/2016   Past Surgical History:  Procedure Laterality Date   BREAST SURGERY  1999   reduction   ENDOMETRIAL ABLATION  2002   FOOT FRACTURE SURGERY  03/2018   Closed displaced fracture of right navicular, nonunion.   KIDNEY STONE SURGERY  2005   LAPAROSCOPIC VAGINAL HYSTERECTOMY  12/02/07   for DUB   LUMBAR ESI  2021   no help ([facility])   REDUCTION MAMMAPLASTY     TRANSTHORACIC ECHOCARDIOGRAM  06/13/2018   Grd I DD   Family History  Problem Relation Age of Onset   Cancer Mother        brain ca (glioblastoma)   Breast cancer Paternal Aunt    Breast cancer Maternal Grandmother    Social History   Socioeconomic History   Marital status: Married    Spouse name: Not on file   Number of children: Not on file   Years of education: Not on file   Highest education level: Not on file  Occupational History   Not on file  Tobacco Use   Smoking status: Never   Smokeless tobacco: Never  Vaping Use   Vaping Use: Never used  Substance and Sexual Activity   Alcohol use: No   Drug use: No   Sexual activity: Not on file  Other Topics Concern   Not on file  Social History Narrative   Married, one adult son.  Lives in [place], [place].   Works for [employer] in [place].    Exercise: walks 3 miles per day.   No T/A/Ds.               Social Determinants of Health   Financial Resource Strain: Low Risk    Difficulty of Paying Living Expenses: Not hard at all  Food Insecurity: No Food Insecurity   Worried About Charity fundraiser in the Last Year: Never true   [place] in the Last Year: Never true  Transportation Needs: No Transportation Needs   Lack of Transportation (Medical): No   Lack of Transportation (Non-Medical): No  Physical Activity: Not on file  Stress: No Stress Concern Present   Feeling of Stress : Not at all  Social Connections: Unknown   Frequency of Communication with Friends and Family: More than  three times a week   Frequency of Social Gatherings with Friends and Family: Not on file   Attends Religious Services: More than 4 times per year   Active Member of [employer] or Organizations: Not on file   Attends Archivist Meetings: Not on file   Marital Status: Married    Tobacco Counseling Counseling given: Not Answered   Clinical Intake:  Pre-visit preparation completed: No  Pain : No/denies pain     Nutritional Risks: None Diabetes: No  How  often do you need to have someone help you when you read instructions, pamphlets, or other written materials from your doctor or pharmacy?: 1 - Never  Diabetic?no  Interpreter Needed?: No      Activities of Daily Living In your present state of health, do you have any difficulty performing the following activities: 03/22/2021  Hearing? N  Vision? N  Difficulty concentrating or making decisions? N  Walking or climbing stairs? N  Dressing or bathing? N  Doing errands, shopping? N  Preparing Food and eating ? N  Using the Toilet? N  In the past six months, have you accidently leaked urine? N  Do you have problems with loss of bowel control? N  Managing your Medications? N  Managing your Finances? N  Housekeeping or managing your Housekeeping? N  Some recent data might be hidden    Patient Care Team: Tammi Sou, MD as PCP - General (Family Medicine) Arvella Nigh, MD as Consulting Physician (Obstetrics and Gynecology) Wylene Simmer, MD as Consulting Physician (Orthopedic Surgery) Levy Pupa, PA-C as Physician Assistant (Orthopedic Surgery)  Indicate any recent Medical Services you may have received from other than [facility] providers in the past year (date may be approximate).     Assessment:   This is a routine wellness examination for Jacqueline Vance.  Hearing/Vision screen No results found.  Dietary issues and exercise activities discussed: Current Exercise Habits: The patient does not participate in regular  exercise at present, Exercise limited by: None identified   Goals Addressed   None    Depression Screen PHQ 2/9 Scores 03/22/2021 10/06/2020 05/17/2020 12/27/2018 09/02/2018 09/02/2018 01/03/2018  PHQ - 2 Score 0 0 0 0 0 0 0  PHQ- 9 Score - 0 0 0 1 - 2    Fall Risk Fall Risk  03/22/2021 10/06/2020  Falls in the past year? 0 0  Number falls in past yr: 0 0  Injury with Fall? 0 0  Risk for fall due to : No Fall Risks -  Follow up Falls evaluation completed Falls evaluation completed    [place]:  Any stairs in or around the home? Yes  If so, are there any without handrails? Yes  Home free of loose throw rugs in walkways, pet beds, electrical cords, etc? Yes  Adequate lighting in your home to reduce risk of falls? Yes   ASSISTIVE DEVICES UTILIZED TO PREVENT FALLS:  Life alert? No  Use of a cane, walker or w/c? No  Grab bars in the bathroom? No  Shower chair or bench in shower? No  Elevated toilet seat or a handicapped toilet? No   TIMED UP AND GO:  Was the test performed?  n/a .  Length of time to ambulate 10 feet: n/a sec.     Cognitive Function:     6CIT Screen 03/22/2021  What Year? 0 points  What month? 0 points  What time? 0 points  Count back from 20 0 points  Months in reverse 0 points  Repeat phrase 0 points  Total Score 0    Immunizations Immunization History  Administered Date(s) Administered   Hepatitis B 08/10/2010, 05/26/2011, 09/08/2011   Influenza,inj,Quad PF,6+ Mos 11/21/2013, 01/15/2015, 09/22/2016, 09/13/2017, 10/04/2018, 10/28/2019, 10/06/2020   Influenza-Unspecified 11/01/2017   Tdap 08/10/2010   Zoster Recombinat (Shingrix) 10/06/2020, 01/24/2021    TDAP status: Up to date  Flu Vaccine status: Up to date  Pneumococcal vaccine status: Declined,  Education has been provided regarding the importance of  this vaccine but patient still declined. Advised may receive this vaccine at local pharmacy or Health Dept.  Aware to provide a copy of the vaccination record if obtained from local pharmacy or Health Dept. Verbalized acceptance and understanding.   Covid-19 vaccine status: Declined, Education has been provided regarding the importance of this vaccine but patient still declined. Advised may receive this vaccine at local pharmacy or Health Dept.or vaccine clinic. Aware to provide a copy of the vaccination record if obtained from local pharmacy or Health Dept. Verbalized acceptance and understanding.  Qualifies for Shingles Vaccine? Yes   Zostavax completed No   Shingrix Completed?: Yes  Screening Tests Health Maintenance  Topic Date Due   Fecal DNA (Cologuard)  Never done   MAMMOGRAM  07/02/2020   TETANUS/TDAP  10/06/2021 (Originally 08/09/2020)   INFLUENZA VACCINE  Completed   Zoster Vaccines- Shingrix  Completed   HPV VACCINES  Aged Out   Hepatitis C Screening  Discontinued   HIV Screening  Discontinued    Health Maintenance  Health Maintenance Due  Topic Date Due   Fecal DNA (Cologuard)  Never done   MAMMOGRAM  07/02/2020    Colorectal cancer screening: Type of screening: Cologuard. Completed ordered in visit. Repeat every n/a years  Mammogram status: Ordered 01/24/21. Pt provided with contact info and advised to call to schedule appt.    Lung Cancer Screening: (Low Dose CT Chest recommended if Age 72-80 years, 30 pack-year currently smoking OR have quit w/in 15years.) does not qualify.   Lung Cancer Screening Referral: n/a  Additional Screening:  Hepatitis C Screening: does not qualify; Completed n/a  Vision Screening: Recommended annual ophthalmology exams for early detection of glaucoma and other disorders of the eye. Is the patient up to date with their annual eye exam?  Yes  Who is the provider or what is the name of the office in which the patient attends annual eye exams? [facility] in [facility]) If pt is not established with a provider,  would they like to be referred to a provider to establish care? No .   Dental Screening: Recommended annual dental exams for proper oral hygiene  Community Resource Referral / Chronic Care Management: CRR required this visit?  No   CCM required this visit?  No      Plan:     I have personally reviewed and noted the following in the patients chart:   Medical and social history Use of alcohol, tobacco or illicit drugs  Current medications and supplements including opioid prescriptions.  Functional ability and status Nutritional status Physical activity Advanced directives List of other physicians Hospitalizations, surgeries, and ER visits in previous 12 months Vitals Screenings to include cognitive, depression, and falls Referrals and appointments  In addition, I have reviewed and discussed with patient certain preventive protocols, quality metrics, and best practice recommendations. A written personalized care plan for preventive services as well as general preventive health recommendations were provided to patient.     Octaviano Glow, CMA   03/22/2021   Nurse Notes: Non-Face to Face or Face to Face 10 minute visit Encounter   Ms. [place] , Thank you for taking time to come for your Medicare Wellness Visit. I appreciate your ongoing commitment to your health goals. Please review the following plan we discussed and let me know if I can assist you in the future.   These are the goals we discussed:  Goals   None     This is  a list of the screening recommended for you and due dates:  Health Maintenance  Topic Date Due   Cologuard (Stool DNA test)  Never done   Mammogram  07/02/2020   Tetanus Vaccine  10/06/2021*   Flu Shot  Completed   Zoster (Shingles) Vaccine  Completed   HPV Vaccine  Aged Out   Hepatitis C Screening: USPSTF Recommendation to screen - Ages 18-79 yo.  Discontinued   HIV Screening  Discontinued  *Topic was postponed. The date shown is not the  original due date.

## 2021-03-22 NOTE — Telephone Encounter (Signed)
Requesting: Xanax ?Contract:02/03/21 ?UDS:10/06/20 ?Last Visit:01/24/21 ?Next Visit:n/a ?Last Refill:12/22/20 (180,0) ? ?Please Advise  ?

## 2021-03-22 NOTE — Patient Instructions (Signed)

## 2021-03-25 ENCOUNTER — Other Ambulatory Visit: Payer: Self-pay | Admitting: Family Medicine

## 2021-04-02 ENCOUNTER — Other Ambulatory Visit: Payer: Self-pay | Admitting: Family Medicine

## 2021-04-05 NOTE — Telephone Encounter (Signed)
Requesting: Norco ?Contract: 11/06/19 ?UDS: 10/06/20 ?Last Visit: 01/24/21 ?Next Visit: advised to f/u 3 mo ?Last Refill: 03/02/21(90,0) ? ?Please Advise. Med pending ?

## 2021-04-06 ENCOUNTER — Other Ambulatory Visit: Payer: Self-pay

## 2021-04-06 MED ORDER — LIDOCAINE 5 % EX PTCH: MEDICATED_PATCH | CUTANEOUS | 3 refills | Status: DC

## 2021-04-06 NOTE — Telephone Encounter (Signed)
Pt advised refill sent. °

## 2021-04-06 NOTE — Telephone Encounter (Signed)
RF request for Lidocaine ?LOV: 01/24/21 ?Next ov: advised to f/u 3 months ?Last written: 03/17/20(30,11) ? ? ?Please review and advise. Med pending. Pt request 90 d/s at a time ?

## 2021-04-06 NOTE — Telephone Encounter (Signed)
Pt advised refill sent. She was made aware she will be due for 3 month f/u around 4/9. She will call back to schedule  ?

## 2021-04-07 DIAGNOSIS — D4102 Neoplasm of uncertain behavior of left kidney: Secondary | ICD-10-CM | POA: Diagnosis not present

## 2021-04-07 DIAGNOSIS — R1084 Generalized abdominal pain: Secondary | ICD-10-CM | POA: Diagnosis not present

## 2021-04-07 LAB — BASIC METABOLIC PANEL
BUN: 5 (ref 4–21)
Creatinine: 0.5 (ref 0.5–1.1)

## 2021-04-07 LAB — COMPREHENSIVE METABOLIC PANEL: eGFR: 106.7

## 2021-04-08 ENCOUNTER — Telehealth: Payer: Self-pay

## 2021-04-08 NOTE — Telephone Encounter (Signed)
[facility] ([facility] [place]) - [id number] ?Lidocaine 5% patches ?    ?Status: PA Response - Denied ? ?Created: March 22nd, 2023 [phone] ? ?Sent: March 24th, 2023 ? ? ?Spoke with Sunday Spillers, BCBS agent regarding denial. Medication is not approved due to having FDA approved dx for use. She tried to contact pt and notify of denial. LM for pt as well. ?

## 2021-04-11 DIAGNOSIS — K5730 Diverticulosis of large intestine without perforation or abscess without bleeding: Secondary | ICD-10-CM | POA: Diagnosis not present

## 2021-04-11 DIAGNOSIS — D4102 Neoplasm of uncertain behavior of left kidney: Secondary | ICD-10-CM | POA: Diagnosis not present

## 2021-04-11 DIAGNOSIS — N2889 Other specified disorders of kidney and ureter: Secondary | ICD-10-CM | POA: Diagnosis not present

## 2021-04-18 ENCOUNTER — Telehealth: Payer: Self-pay | Admitting: Family Medicine

## 2021-04-18 NOTE — Telephone Encounter (Signed)
Pt called about scheduling an appt for a spot on her chest. ? ?Pt said she went to Hospital & they informed her to see her PCP. Pt said will a scan be done during her visit?  ? ?I informed pt I'm not sure. I could schedule her for OV. ? ?Pt cell: [phone] ?

## 2021-04-19 NOTE — Telephone Encounter (Signed)
Spoke with pt regarding imaging for spot on her chest. Pt advised she had CT chest WO contrast feb 2022, pulmonary nodule was benign and unchanged form 2 years ago. No further imaging needed. Pt wanted appt originally scheduled for 4/10 to be for CPE. Appt date and time changed due to pt not being able to fast. Appt changed to 4/5 ?

## 2021-04-20 ENCOUNTER — Encounter: Payer: Medicare Other | Admitting: Family Medicine

## 2021-04-25 ENCOUNTER — Encounter: Payer: Medicare Other | Admitting: Family Medicine

## 2021-04-28 ENCOUNTER — Ambulatory Visit (INDEPENDENT_AMBULATORY_CARE_PROVIDER_SITE_OTHER): Payer: Medicare Other | Admitting: Family Medicine

## 2021-04-28 ENCOUNTER — Encounter: Payer: Self-pay | Admitting: Family Medicine

## 2021-04-28 VITALS — BP 126/80 | HR 57 | Temp 97.9°F | Ht 65.0 in | Wt 150.0 lb

## 2021-04-28 DIAGNOSIS — F3340 Major depressive disorder, recurrent, in remission, unspecified: Secondary | ICD-10-CM | POA: Diagnosis not present

## 2021-04-28 DIAGNOSIS — M549 Dorsalgia, unspecified: Secondary | ICD-10-CM

## 2021-04-28 DIAGNOSIS — G8929 Other chronic pain: Secondary | ICD-10-CM

## 2021-04-28 DIAGNOSIS — Z79899 Other long term (current) drug therapy: Secondary | ICD-10-CM | POA: Diagnosis not present

## 2021-04-28 DIAGNOSIS — F411 Generalized anxiety disorder: Secondary | ICD-10-CM | POA: Diagnosis not present

## 2021-04-28 DIAGNOSIS — M4120 Other idiopathic scoliosis, site unspecified: Secondary | ICD-10-CM

## 2021-04-28 DIAGNOSIS — G894 Chronic pain syndrome: Secondary | ICD-10-CM

## 2021-04-28 DIAGNOSIS — M545 Low back pain, unspecified: Secondary | ICD-10-CM

## 2021-04-28 MED ORDER — ELETRIPTAN HYDROBROMIDE 40 MG PO TABS: ORAL_TABLET | ORAL | 2 refills | Status: DC

## 2021-04-28 MED ORDER — IBUPROFEN 600 MG PO TABS: ORAL_TABLET | ORAL | 3 refills | Status: DC

## 2021-04-28 MED ORDER — CITALOPRAM HYDROBROMIDE 40 MG PO TABS: 40.0000 mg | ORAL_TABLET | Freq: Every day | ORAL | 1 refills | Status: DC

## 2021-04-28 MED ORDER — FLUOROURACIL 5 % EX CREA: TOPICAL_CREAM | CUTANEOUS | 6 refills | Status: DC

## 2021-04-28 MED ORDER — HYDROCODONE-ACETAMINOPHEN 5-325 MG PO TABS: 1.0000 | ORAL_TABLET | Freq: Three times a day (TID) | ORAL | 0 refills | Status: DC | PRN

## 2021-04-28 NOTE — Progress Notes (Signed)
Office Note ?04/28/2021 ? ?CC:  ?Chief Complaint  ?Patient presents with  ? Annual Exam  ?  Pt is fasting  ? ?HPI:  ?Patient is a 61 y.o. female who is here for 28-monthfollow-up chronic pain syndrome and MDD/GAD. ?A/P as of last visit: ?"#1 chronic pain syndrome.  Primarily the issue is her low back. ?Stable.  Continue Vicodin 5-3 25, 1 tab 3 times daily, 90/month.  No new prescription needed today. ?UDS utd 09/2020, appropriate results. ?CSC UTD. ?  ?#2 recurrent major depressive disorder, in remission.  GAD, doing pretty well. ?Continue citalopram 40 mg a day and alprazolam 1 mg twice a day as needed. ?Controlled substance contract updated today. ?Urine drug screen with appropriate results September 2022. ?  ?#3 Health maintenance exam: ?Reviewed age and gender appropriate health maintenance issues (prudent diet, regular exercise, health risks of tobacco and excessive alcohol, use of seatbelts, fire alarms in home, use of sunscreen).  Also reviewed age and gender appropriate health screening as well as vaccine recommendations. ?Vaccines: Shingrix #2-->given today.  Otherwise UTD. ?LABS: health panel ordered today---she had a poptart this morning. ?Cervical ca screening: hx of hysterectomy for benign dx-->no further pelvics/paps indicated. ?Breast ca screening: normal mammogram 07/03/19. Repeat overdue-->ordered. ?Colon ca screening: Sept 2022 iFOB ordered, not returned yet. ? ?INTERIM HX: ?Pain is the same, located in mid back diffusely without radiation down the legs or across the hips or abdomen.  No paresthesias or leg weakness. ?She continues to worry that this pain is coming from her kidneys/kidney stone/or even something in her pelvis. ?She saw urologist, Dr. BGloriann Loan at some point last month.  No records available at this time. ?Patient states she got a repeat CAT scan that did not show any stones but she was told that the lesion on her left kidney had changed.  She was then referred to another urologist,  Dr. WNicholas Loseupcoming. ? ?Anxiety level stable.  Mood stable. ? ?Indication for chronic opioid: neck, mid back, low back, HA's, myofascial pain, arthralgias of appendicular skeleton.  Some days finds it hard to get out of bed and function.   ?She carries dx of DDD of C and L spine, significant scoliosis, myofascial pain syndrome with trigger points and recurrent trochanteric bursitis.  Has seen a handful of specialists in the past ([place], [place] [facility] [place]  She has never been managed by a pain mgmt specialist.   ?Medication and dose: vicodin 5/325, 1 tid prn, #90 per mo. ?PMP AWARE reviewed today: most recent rx for vicodin was filled 04/06/2021, #90, rx by me. ?Most recent alprazolam prescription filled 03/23/2021, #180, prescribed by me. ?No red flags. ? ?Anx/dep: ? ? ?Past Medical History:  ?Diagnosis Date  ? Adult ADHD   ? Anxiety   ? [place] 08[place]  ? Chronic back pain   ? neck, too.  MRI L spine 05/2008: 40 degrees dextroscoliosis due to a hemivertebra of L3.  Some DDD/spondylosis w/out spinal stenosis.  ? Depression   ? Diastolic dysfunction, left ventricle 05/2018  ? Echo with grd I DD.  ? Gout   ? 2 attacks total as of 12/2014.  ? Kidney stones 2005; 2017; 2018  ? Most recent CT 08/2016 showed 3 mm nonobstructing L nephrolithiasis.  ? Migraine   ? Proph tx with topamax helps, abortive tx with relpax helps.  Couldn't afford topamax so d/c'd this 04/2016.    ?  Navicular fracture, foot 03/2018  ? Right Navicular fx-nonunion (surgery)  ? Pulmonary nodule 2018  ? 5 mm LLL, stable on 2 yr f/u imaging 2022-->benign, no further imaging needed.  ? Restless legs syndrome   ? Pt can get to sleep fine so she declines med for this as of 08/2016  ? ? ?Past Surgical History:  ?Procedure Laterality Date  ? BREAST SURGERY  1999  ? reduction  ? ENDOMETRIAL ABLATION  2002  ? FOOT FRACTURE SURGERY  03/2018  ? Closed  displaced fracture of right navicular, nonunion.  ? KIDNEY STONE SURGERY  2005  ? LAPAROSCOPIC VAGINAL HYSTERECTOMY  12/02/07  ? for DUB  ? LUMBAR ESI  2021  ? no help ([facility])  ? REDUCTION MAMMAPLASTY    ? TRANSTHORACIC ECHOCARDIOGRAM  06/13/2018  ? Grd I DD  ? ?Outpatient Medications Prior to Visit  ?Medication Sig Dispense Refill  ? albuterol (VENTOLIN HFA) 108 (90 Base) MCG/ACT inhaler Inhale 2 puffs into the lungs every 4 (four) hours as needed for wheezing or shortness of breath. 18 g 0  ? ALPRAZolam (XANAX) 1 MG tablet TAKE ONE TABLET BY MOUTH TWICE A DAY AS NEEDED FOR ANXIETY 180 tablet 1  ? Biotin 10000 MCG TABS Take 1 tablet by mouth daily.    ? citalopram (CELEXA) 40 MG tablet Take 1 tablet (40 mg total) by mouth daily. 90 tablet 3  ? colchicine 0.6 MG tablet 2 tabs po at onset of gout pain, then 1 tab po one hour later (Patient not taking: Reported on 01/24/2021) 15 tablet 2  ? Cyanocobalamin (VITAMIN B-12 PO) Take 1 tablet by mouth daily. Reported on 04/07/2015    ? eletriptan (RELPAX) 40 MG tablet TAKED 1 TABLET BY MOUTH AT ONSET OF MIGRIANE HEADACHE MAY REPEAT IN 2 HOURS IF HEADACHE PERSISTS OR RECURS 18 tablet 0  ? fluorouracil (EFUDEX) 5 % cream Apply to areas of sun damage on the face twice daily for 4-5 days (Patient not taking: Reported on 01/24/2021)    ? HYDROcodone-acetaminophen (NORCO/VICODIN) 5-325 MG tablet TAKE ONE TABLET BY MOUTH THREE TIMES DAILY AS NEEDED. 90 tablet 0  ? ibuprofen (ADVIL) 600 MG tablet TAKE ONE TABLET (600 MG TOTAL) BY MOUTH EVERY 8 HOURS AS NEEDED. 30 tablet 1  ? lidocaine (LIDODERM) 5 % 1 patch to the affected area q12h prn 90 patch 3  ? methocarbamol (ROBAXIN) 500 MG tablet TAKE 1 OR 2 TABLETS BY MOUTH EVERY 6 HOURS AS NEEDED FOR MUSCULAR RELAXATION 30 tablet 6  ? pantoprazole (PROTONIX) 40 MG tablet TAKE ONE (1) TABLET BY MOUTH EVERY DAY 90 tablet 3  ? tiZANidine (ZANAFLEX) 4 MG tablet 1 tab po qhs prn 90 tablet 1  ? tretinoin (RETIN-A) 0.025 % cream Apply 1 application  topically at bedtime as needed. For acne prevention/control 45 g 3  ? ?No facility-administered medications prior to visit.  ? ? ?Allergies  ?Allergen Reactions  ? Imitrex [Sumatriptan] Other (See Comments)  ?  Face and Arm numbness  ? ?ROS as above, plus--> no fevers, no CP, no SOB, no wheezing, no cough, no dizziness, no HAs, no rashes, no melena/hematochezia.  No polyuria or polydipsia.  No focal weakness, paresthesias, or tremors.  No acute vision or hearing abnormalities.  No dysuria or unusual/new urinary urgency or frequency.  No recent changes in lower legs. ?No n/v/d or abd pain.  No palpitations.   ? ?PE; ? ?  04/28/2021  ?  9:26 AM 01/24/2021  ? 11:12 AM  10/06/2020  ? 11:25 AM  ?Vitals with BMI  ?Height '5\' 5"'$  5' 2.99" 5' 2.992"  ?Weight 150 lbs 149 lbs 13 oz 157 lbs 3 oz  ?BMI 24.96 26.54 27.85  ?Systolic 256 389 373  ?Diastolic 80 82 78  ?Pulse 57 60 75  ? ?Gen: Alert, well appearing.  Patient is oriented to person, place, time, and situation. ?AFFECT: pleasant, lucid thought and speech. ?BACK: notable scoliotic deformity, mild TTP all over lower T spine and upper L spine. ? ?Pertinent labs:  ?Lab Results  ?Component Value Date  ? TSH 0.87 01/24/2021  ? ?Lab Results  ?Component Value Date  ? WBC 3.1 (L) 01/24/2021  ? HGB 14.1 01/24/2021  ? HCT 42.8 01/24/2021  ? MCV 88.5 01/24/2021  ? PLT 167.0 01/24/2021  ? ?Lab Results  ?Component Value Date  ? CREATININE 0.80 01/24/2021  ? BUN 5 (L) 01/24/2021  ? NA 139 01/24/2021  ? K 4.1 01/24/2021  ? CL 104 01/24/2021  ? CO2 29 01/24/2021  ? ?Lab Results  ?Component Value Date  ? ALT 14 01/24/2021  ? AST 21 01/24/2021  ? ALKPHOS 60 01/24/2021  ? BILITOT 0.4 01/24/2021  ? ?Lab Results  ?Component Value Date  ? CHOL 152 01/24/2021  ? ?Lab Results  ?Component Value Date  ? HDL 41.00 01/24/2021  ? ?Lab Results  ?Component Value Date  ? [place] 81 01/24/2021  ? ?Lab Results  ?Component Value Date  ? TRIG 148.0 01/24/2021  ? ?Lab Results  ?Component Value Date  ? CHOLHDL 4  01/24/2021  ? ?ASSESSMENT AND PLAN:  ? ?#1 chronic pain syndrome--multifactorial. ?Primarily mid back due to her scoliosis.  She does have some degenerative joint disease in her spine. ?Her pain is well control

## 2021-05-03 DIAGNOSIS — Z1211 Encounter for screening for malignant neoplasm of colon: Secondary | ICD-10-CM | POA: Diagnosis not present

## 2021-05-04 ENCOUNTER — Telehealth: Payer: Self-pay

## 2021-05-04 NOTE — Telephone Encounter (Signed)
Patient is requesting for Dr. Anitra Lauth to call her in the morning please please please.  She has an appt with urologist tomorrow afternoon but she would be more comfortable talking with Dr. Anitra Lauth about everything before she goes to appt. ?

## 2021-05-04 NOTE — Telephone Encounter (Signed)
Patient is requesting to speak with [facility] regarding results she rec'd and to discuss where to go from here. ? ?Please call [phone] ?

## 2021-05-05 DIAGNOSIS — R911 Solitary pulmonary nodule: Secondary | ICD-10-CM | POA: Diagnosis not present

## 2021-05-05 DIAGNOSIS — D49512 Neoplasm of unspecified behavior of left kidney: Secondary | ICD-10-CM | POA: Diagnosis not present

## 2021-05-05 NOTE — Telephone Encounter (Signed)
We only received OV note, CT imaging was not available for attachment yet. ?

## 2021-05-05 NOTE — Telephone Encounter (Signed)
Pt advised and will discuss with Dr.Bell during OV this afternoon.  ?

## 2021-05-11 ENCOUNTER — Encounter: Payer: Self-pay | Admitting: Family Medicine

## 2021-05-11 LAB — COLOGUARD: COLOGUARD: NEGATIVE

## 2021-05-12 ENCOUNTER — Ambulatory Visit: Payer: Medicare Other

## 2021-05-13 ENCOUNTER — Ambulatory Visit
Admission: RE | Admit: 2021-05-13 | Discharge: 2021-05-13 | Disposition: A | Discharge status: Home / Self Care | Payer: Medicare Other | Source: Ambulatory Visit | Attending: Family Medicine | Admitting: Family Medicine

## 2021-05-13 DIAGNOSIS — Z1231 Encounter for screening mammogram for malignant neoplasm of breast: Secondary | ICD-10-CM | POA: Diagnosis not present

## 2021-05-13 DIAGNOSIS — D49512 Neoplasm of unspecified behavior of left kidney: Secondary | ICD-10-CM | POA: Diagnosis not present

## 2021-05-13 DIAGNOSIS — I517 Cardiomegaly: Secondary | ICD-10-CM | POA: Diagnosis not present

## 2021-05-13 DIAGNOSIS — Z87448 Personal history of other diseases of urinary system: Secondary | ICD-10-CM | POA: Diagnosis not present

## 2021-05-13 DIAGNOSIS — I700 Atherosclerosis of aorta: Secondary | ICD-10-CM | POA: Diagnosis not present

## 2021-05-13 DIAGNOSIS — R918 Other nonspecific abnormal finding of lung field: Secondary | ICD-10-CM | POA: Diagnosis not present

## 2021-05-16 ENCOUNTER — Other Ambulatory Visit: Payer: Self-pay | Admitting: Urology

## 2021-05-31 NOTE — Progress Notes (Addendum)
COVID Vaccine Completed: no ? ?Date of COVID positive in last 90 days: no ? ?PCP - Shawnie Dapper, MD ?Cardiologist - n/a ? ?Chest x-ray - ?EKG - n/a ?Stress Test - 20 years ago per pt ?ECHO - 06/13/18 Epic ?Cardiac Cath - n/a ?Pacemaker/ICD device last checked: n/a ?Spinal Cord Stimulator:n/a ? ?Bowel Prep - clears day before surgery ? ?Sleep Study - n/a ?CPAP -  ? ?Fasting Blood Sugar - n/a ?Checks Blood Sugar _____ times a day ? ?Blood Thinner Instructions: n/a ?Aspirin Instructions: ?Last Dose: ? ?Activity level: Can go up a flight of stairs and perform activities of daily living without stopping and without symptoms of chest pain or shortness of breath. ?     ? ?Anesthesia review:  ? ?Patient denies shortness of breath, fever, cough and chest pain at PAT appointment ? ? ?Patient verbalized understanding of instructions that were given to them at the PAT appointment. Patient was also instructed that they will need to review over the PAT instructions again at home before surgery.  ?

## 2021-05-31 NOTE — Patient Instructions (Addendum)
DUE TO COVID-19 ONLY TWO VISITORS  (aged 59 and older)  ARE ALLOWED TO COME WITH YOU AND STAY IN THE WAITING ROOM ONLY DURING PRE OP AND PROCEDURE.   ?**NO VISITORS ARE ALLOWED IN THE SHORT STAY AREA OR RECOVERY ROOM!!** ? ?IF YOU WILL BE ADMITTED INTO THE HOSPITAL YOU ARE ALLOWED ONLY FOUR SUPPORT PEOPLE DURING VISITATION HOURS ONLY (7 AM -8PM)   ?The support person(s) must pass our screening, gel in and out, and wear a mask at all times, including in the patient?s room. ?Patients must also wear a mask when staff or their support person are in the room. ?Visitors GUEST BADGE MUST BE WORN VISIBLY  ?One adult visitor may remain with you overnight and MUST be in the room by 8 P.M. ?  ? ? Your procedure is scheduled on: 06/07/21 ? ? Report to [facility] Main Entrance ? ?  Report to admitting at 5:15 AM ? ? Call this number if you have problems the morning of surgery [phone] ? ? Follow clear liquid diet the day before surgery. ? ? You may have the following liquids until 4:30 AM DAY OF SURGERY ? ?Water ?Black Coffee (sugar ok, NO MILK/CREAM OR CREAMERS)  ?Tea (sugar ok, NO MILK/CREAM OR CREAMERS) regular and decaf                             ?Plain Jell-O (NO RED)                                           ?Fruit ices (not with fruit pulp, NO RED)                                     ?Popsicles (NO RED)                                                                  ?Juice: apple, WHITE grape, WHITE cranberry ?Sports drinks like Gatorade (NO RED) ?Clear broth(vegetable,chicken,beef) ? ?FOLLOW BOWEL PREP AND ANY ADDITIONAL PRE OP INSTRUCTIONS YOU RECEIVED FROM YOUR SURGEON'S OFFICE!!! ?  ?  ?Oral Hygiene is also important to reduce your risk of infection.                                    ?Remember - BRUSH YOUR TEETH THE MORNING OF SURGERY WITH YOUR REGULAR TOOTHPASTE ? ? Take these medicines the morning of surgery with A SIP OF WATER: Inhalers, Xanax, Citalopram, Norco ?                  ?           You  may not have any metal on your body including hair pins, jewelry, and body piercing ? ?           Do not wear make-up, lotions, powders, perfumes, or deodorant ? ?Do not wear nail polish including gel and S&S, artificial/acrylic nails, or any other type  of covering on natural nails including finger and toenails. If you have artificial nails, gel coating, etc. that needs to be removed by a nail salon please have this removed prior to surgery or surgery may need to be canceled/ delayed if the surgeon/ anesthesia feels like they are unable to be safely monitored.  ? ?Do not shave  48 hours prior to surgery.  ? ? Do not bring valuables to the hospital. [facility] NOT ?            RESPONSIBLE   FOR VALUABLES. ? ? Contacts, dentures or bridgework may not be worn into surgery. ? ? Bring small overnight bag day of surgery. ?  ? Special Instructions: Bring a copy of your healthcare power of attorney and living will documents         the day of surgery if you haven't scanned them before. ? ?            Please read over the following fact sheets you were given: IF [place] [phone]- Apolonio Schneiders ? ?   [place] - Preparing for Surgery ?Before surgery, you can play an important role.  Because skin is not sterile, your skin needs to be as free of germs as possible.  You can reduce the number of germs on your skin by washing with CHG (chlorahexidine gluconate) soap before surgery.  CHG is an antiseptic cleaner which kills germs and bonds with the skin to continue killing germs even after washing. ?Please DO NOT use if you have an allergy to CHG or antibacterial soaps.  If your skin becomes reddened/irritated stop using the CHG and inform your nurse when you arrive at Short Stay. ?Do not shave (including legs and underarms) for at least 48 hours prior to the first CHG shower.  You may shave your face/neck. ? ?Please follow these instructions carefully: ? 1.  Shower with CHG  Soap the night before surgery and the  morning of surgery. ? 2.  If you choose to wash your hair, wash your hair first as usual with your normal  shampoo. ? 3.  After you shampoo, rinse your hair and body thoroughly to remove the shampoo.                            ? 4.  Use CHG as you would any other liquid soap.  You can apply chg directly to the skin and wash.  Gently with a scrungie or clean washcloth. ? 5.  Apply the CHG Soap to your body ONLY FROM THE NECK DOWN.   Do   not use on face/ open      ?                     Wound or open sores. Avoid contact with eyes, ears mouth and   genitals (private parts).  ?                     Production manager,  Genitals (private parts) with your normal soap. ?            6.  Wash thoroughly, paying special attention to the area where your    surgery  will be performed. ? 7.  Thoroughly rinse your body with warm water from the neck down. ? 8.  DO NOT shower/wash with your normal soap after using and rinsing  off the CHG Soap. ?               9.  Pat yourself dry with a clean towel. ?           10.  Wear clean pajamas. ?           11.  Place clean sheets on your bed the night of your first shower and do not  sleep with pets. ?Day of Surgery : ?Do not apply any lotions/deodorants the morning of surgery.  Please wear clean clothes to the hospital/surgery center. ? ?FAILURE TO FOLLOW THESE INSTRUCTIONS MAY RESULT IN THE CANCELLATION OF YOUR SURGERY ? ?PATIENT SIGNATURE_________________________________ ? ?NURSE SIGNATURE__________________________________ ? ?________________________________________________________________________  ? ?Incentive Spirometer ? ?An incentive spirometer is a tool that can help keep your lungs clear and active. This tool measures how well you are filling your lungs with each breath. Taking long deep breaths may help reverse or decrease the chance of developing breathing (pulmonary) problems (especially infection) following: ?A long period of time when you are  unable to move or be active. ?BEFORE THE PROCEDURE  ?If the spirometer includes an indicator to show your best effort, your nurse or respiratory therapist will set it to a desired goal. ?If possible, sit up straight or lean slightly forward. Try not to slouch. ?Hold the incentive spirometer in an upright position. ?INSTRUCTIONS FOR USE  ?Sit on the edge of your bed if possible, or sit up as far as you can in bed or on a chair. ?Hold the incentive spirometer in an upright position. ?Breathe out normally. ?Place the mouthpiece in your mouth and seal your lips tightly around it. ?Breathe in slowly and as deeply as possible, raising the piston or the ball toward the top of the column. ?Hold your breath for 3-5 seconds or for as long as possible. Allow the piston or ball to fall to the bottom of the column. ?Remove the mouthpiece from your mouth and breathe out normally. ?Rest for a few seconds and repeat Steps 1 through 7 at least 10 times every 1-2 hours when you are awake. Take your time and take a few normal breaths between deep breaths. ?The spirometer may include an indicator to show your best effort. Use the indicator as a goal to work toward during each repetition. ?After each set of 10 deep breaths, practice coughing to be sure your lungs are clear. If you have an incision (the cut made at the time of surgery), support your incision when coughing by placing a pillow or rolled up towels firmly against it. ?Once you are able to get out of bed, walk around indoors and cough well. You may stop using the incentive spirometer when instructed by your caregiver.  ?RISKS AND COMPLICATIONS ?Take your time so you do not get dizzy or light-headed. ?If you are in pain, you may need to take or ask for pain medication before doing incentive spirometry. It is harder to take a deep breath if you are having pain. ?AFTER USE ?Rest and breathe slowly and easily. ?It can be helpful to keep track of a log of your progress. Your  caregiver can provide you with a simple table to help with this. ?If you are using the spirometer at home, follow these instructions: ?SEEK MEDICAL CARE IF:  ?You are having difficultly using the spirometer. ?Hope Budds

## 2021-06-01 ENCOUNTER — Encounter (HOSPITAL_COMMUNITY)
Admission: RE | Admit: 2021-06-01 | Discharge: 2021-06-01 | Disposition: A | Discharge status: Home / Self Care | Payer: Medicare Other | Source: Ambulatory Visit | Attending: Urology | Admitting: Urology

## 2021-06-01 ENCOUNTER — Encounter (HOSPITAL_COMMUNITY): Payer: Self-pay

## 2021-06-01 DIAGNOSIS — Z905 Acquired absence of kidney: Secondary | ICD-10-CM | POA: Diagnosis not present

## 2021-06-01 DIAGNOSIS — Z01812 Encounter for preprocedural laboratory examination: Secondary | ICD-10-CM | POA: Diagnosis not present

## 2021-06-01 HISTORY — DX: Other complications of anesthesia, initial encounter: T88.59XA

## 2021-06-01 HISTORY — DX: Gastro-esophageal reflux disease without esophagitis: K21.9

## 2021-06-01 HISTORY — DX: Cardiac murmur, unspecified: R01.1

## 2021-06-01 LAB — CBC
HCT: 39 % (ref 36.0–46.0)
Hemoglobin: 13.5 g/dL (ref 12.0–15.0)
MCH: 31.1 pg (ref 26.0–34.0)
MCHC: 34.6 g/dL (ref 30.0–36.0)
MCV: 89.9 fL (ref 80.0–100.0)
Platelets: 169 10*3/uL (ref 150–400)
RBC: 4.34 MIL/uL (ref 3.87–5.11)
RDW: 12.5 % (ref 11.5–15.5)
WBC: 3.9 10*3/uL — ABNORMAL LOW (ref 4.0–10.5)
nRBC: 0 % (ref 0.0–0.2)

## 2021-06-01 LAB — BASIC METABOLIC PANEL
Anion gap: 7 (ref 5–15)
BUN: <5 mg/dL — ABNORMAL LOW (ref 6–20)
CO2: 27 mmol/L (ref 22–32)
Calcium: 9.2 mg/dL (ref 8.9–10.3)
Chloride: 108 mmol/L (ref 98–111)
Creatinine, Ser: 0.81 mg/dL (ref 0.44–1.00)
GFR, Estimated: >60 mL/min (ref >60)
Glucose, Bld: 96 mg/dL (ref 70–99)
Potassium: 3.7 mmol/L (ref 3.5–5.1)
Sodium: 142 mmol/L (ref 135–145)

## 2021-06-07 ENCOUNTER — Other Ambulatory Visit: Payer: Self-pay

## 2021-06-07 ENCOUNTER — Ambulatory Visit (HOSPITAL_COMMUNITY): Payer: Medicare Other | Admitting: Certified Registered Nurse Anesthetist

## 2021-06-07 ENCOUNTER — Observation Stay (HOSPITAL_COMMUNITY)
Admission: RE | Admit: 2021-06-07 | Discharge: 2021-06-09 | Disposition: A | Discharge status: Home / Self Care | Payer: Medicare Other | Attending: Urology | Admitting: Urology

## 2021-06-07 ENCOUNTER — Encounter (HOSPITAL_COMMUNITY)
Admission: RE | Disposition: A | Discharge status: Home / Self Care | Payer: Self-pay | Source: Home / Self Care | Attending: Urology

## 2021-06-07 ENCOUNTER — Encounter (HOSPITAL_COMMUNITY): Payer: Self-pay | Admitting: Urology

## 2021-06-07 ENCOUNTER — Ambulatory Visit (HOSPITAL_BASED_OUTPATIENT_CLINIC_OR_DEPARTMENT_OTHER): Payer: Medicare Other | Admitting: Certified Registered Nurse Anesthetist

## 2021-06-07 DIAGNOSIS — C642 Malignant neoplasm of left kidney, except renal pelvis: Secondary | ICD-10-CM | POA: Diagnosis not present

## 2021-06-07 DIAGNOSIS — N2889 Other specified disorders of kidney and ureter: Secondary | ICD-10-CM

## 2021-06-07 HISTORY — PX: ROBOTIC ASSITED PARTIAL NEPHRECTOMY: SHX6087

## 2021-06-07 LAB — BASIC METABOLIC PANEL
Anion gap: 6 (ref 5–15)
BUN: 7 mg/dL (ref 6–20)
CO2: 28 mmol/L (ref 22–32)
Calcium: 8.7 mg/dL — ABNORMAL LOW (ref 8.9–10.3)
Chloride: 107 mmol/L (ref 98–111)
Creatinine, Ser: 0.75 mg/dL (ref 0.44–1.00)
GFR, Estimated: >60 mL/min (ref >60)
Glucose, Bld: 134 mg/dL — ABNORMAL HIGH (ref 70–99)
Potassium: 3.5 mmol/L (ref 3.5–5.1)
Sodium: 141 mmol/L (ref 135–145)

## 2021-06-07 LAB — HEMOGLOBIN AND HEMATOCRIT, BLOOD
HCT: 36.5 % (ref 36.0–46.0)
Hemoglobin: 12.1 g/dL (ref 12.0–15.0)

## 2021-06-07 LAB — TYPE AND SCREEN
ABO/RH(D): O POS
Antibody Screen: NEGATIVE

## 2021-06-07 LAB — ABO/RH: ABO/RH(D): O POS

## 2021-06-07 SURGERY — NEPHRECTOMY, PARTIAL, ROBOT-ASSISTED
Anesthesia: General | Laterality: Left

## 2021-06-07 MED ORDER — BUPIVACAINE LIPOSOME 1.3 % IJ SUSP
INTRAMUSCULAR | Status: AC
  Filled 2021-06-07: qty 20

## 2021-06-07 MED ORDER — ROCURONIUM BROMIDE 10 MG/ML (PF) SYRINGE
PREFILLED_SYRINGE | INTRAVENOUS | Status: DC | PRN
  Administered 2021-06-07: 60 mg via INTRAVENOUS
  Administered 2021-06-07: 20 mg via INTRAVENOUS
  Administered 2021-06-07: 40 mg via INTRAVENOUS

## 2021-06-07 MED ORDER — MEPERIDINE HCL 50 MG/ML IJ SOLN: 6.2500 mg | INTRAMUSCULAR | Status: DC | PRN

## 2021-06-07 MED ORDER — DEXTROSE-NACL 5-0.45 % IV SOLN: INTRAVENOUS | Status: DC

## 2021-06-07 MED ORDER — CEFAZOLIN SODIUM-DEXTROSE 2-4 GM/100ML-% IV SOLN
INTRAVENOUS | Status: AC
  Filled 2021-06-07: qty 100

## 2021-06-07 MED ORDER — ONDANSETRON HCL 4 MG/2ML IJ SOLN: 4.0000 mg | Freq: Once | INTRAMUSCULAR | Status: DC | PRN

## 2021-06-07 MED ORDER — FENTANYL CITRATE (PF) 100 MCG/2ML IJ SOLN
INTRAMUSCULAR | Status: AC
  Filled 2021-06-07: qty 2

## 2021-06-07 MED ORDER — LACTATED RINGERS IV SOLN: INTRAVENOUS | Status: DC | PRN

## 2021-06-07 MED ORDER — PROPOFOL 10 MG/ML IV BOLUS
INTRAVENOUS | Status: DC | PRN
  Administered 2021-06-07: 150 mg via INTRAVENOUS

## 2021-06-07 MED ORDER — "VISTASEAL 4 ML SINGLE DOSE KIT "
4.0000 mL | PACK | Freq: Once | CUTANEOUS | Status: AC
  Administered 2021-06-07: 4 mL via TOPICAL
  Filled 2021-06-07: qty 4

## 2021-06-07 MED ORDER — PHENYLEPHRINE HCL-NACL 20-0.9 MG/250ML-% IV SOLN
INTRAVENOUS | Status: DC | PRN
  Administered 2021-06-07: 50 ug/min via INTRAVENOUS

## 2021-06-07 MED ORDER — SUGAMMADEX SODIUM 500 MG/5ML IV SOLN
INTRAVENOUS | Status: AC
  Filled 2021-06-07: qty 5

## 2021-06-07 MED ORDER — BUPIVACAINE LIPOSOME 1.3 % IJ SUSP
INTRAMUSCULAR | Status: DC | PRN
  Administered 2021-06-07: 20 mL

## 2021-06-07 MED ORDER — SODIUM CHLORIDE 0.9% FLUSH
3.0000 mL | Freq: Two times a day (BID) | INTRAVENOUS | Status: DC
  Administered 2021-06-08: 3 mL via INTRAVENOUS

## 2021-06-07 MED ORDER — CHLORHEXIDINE GLUCONATE 0.12 % MT SOLN
15.0000 mL | Freq: Once | OROMUCOSAL | Status: AC
  Administered 2021-06-07: 15 mL via OROMUCOSAL

## 2021-06-07 MED ORDER — 0.9 % SODIUM CHLORIDE (POUR BTL) OPTIME
TOPICAL | Status: DC | PRN
  Administered 2021-06-07: 1000 mL

## 2021-06-07 MED ORDER — PROPOFOL 10 MG/ML IV BOLUS
INTRAVENOUS | Status: AC
  Filled 2021-06-07: qty 20

## 2021-06-07 MED ORDER — ALBUTEROL SULFATE (2.5 MG/3ML) 0.083% IN NEBU: 2.5000 mg | INHALATION_SOLUTION | RESPIRATORY_TRACT | Status: DC | PRN

## 2021-06-07 MED ORDER — ACETAMINOPHEN 10 MG/ML IV SOLN
1000.0000 mg | Freq: Four times a day (QID) | INTRAVENOUS | Status: AC
Start: 2021-06-07 — End: 2021-06-08
  Administered 2021-06-07 – 2021-06-08 (×4): 1000 mg via INTRAVENOUS
  Filled 2021-06-07 (×5): qty 100

## 2021-06-07 MED ORDER — ROCURONIUM BROMIDE 10 MG/ML (PF) SYRINGE
PREFILLED_SYRINGE | INTRAVENOUS | Status: AC
  Filled 2021-06-07: qty 10

## 2021-06-07 MED ORDER — SODIUM CHLORIDE 0.9% FLUSH: 3.0000 mL | INTRAVENOUS | Status: DC | PRN

## 2021-06-07 MED ORDER — DEXAMETHASONE SODIUM PHOSPHATE 10 MG/ML IJ SOLN
INTRAMUSCULAR | Status: DC | PRN
  Administered 2021-06-07: 10 mg via INTRAVENOUS

## 2021-06-07 MED ORDER — HYDROMORPHONE HCL 1 MG/ML IJ SOLN
0.2500 mg | INTRAMUSCULAR | Status: DC | PRN
  Administered 2021-06-07 (×2): 0.5 mg via INTRAVENOUS

## 2021-06-07 MED ORDER — ALPRAZOLAM 1 MG PO TABS
1.0000 mg | ORAL_TABLET | Freq: Two times a day (BID) | ORAL | Status: DC | PRN
  Administered 2021-06-08: 1 mg via ORAL
  Filled 2021-06-07 (×2): qty 1

## 2021-06-07 MED ORDER — OXYCODONE HCL 5 MG PO TABS
5.0000 mg | ORAL_TABLET | ORAL | Status: DC | PRN
  Administered 2021-06-07 – 2021-06-09 (×3): 5 mg via ORAL
  Filled 2021-06-07 (×3): qty 1

## 2021-06-07 MED ORDER — SENNOSIDES-DOCUSATE SODIUM 8.6-50 MG PO TABS
2.0000 | ORAL_TABLET | Freq: Every day | ORAL | Status: DC
  Administered 2021-06-07 – 2021-06-08 (×2): 2 via ORAL
  Filled 2021-06-07 (×2): qty 2

## 2021-06-07 MED ORDER — LACTATED RINGERS IV SOLN: INTRAVENOUS | Status: DC

## 2021-06-07 MED ORDER — PHENYLEPHRINE HCL-NACL 20-0.9 MG/250ML-% IV SOLN
INTRAVENOUS | Status: AC
  Filled 2021-06-07: qty 250

## 2021-06-07 MED ORDER — KETAMINE HCL 50 MG/5ML IJ SOSY
PREFILLED_SYRINGE | INTRAMUSCULAR | Status: AC
  Filled 2021-06-07: qty 5

## 2021-06-07 MED ORDER — ONDANSETRON HCL 4 MG/2ML IJ SOLN
INTRAMUSCULAR | Status: AC
  Filled 2021-06-07: qty 2

## 2021-06-07 MED ORDER — FENTANYL CITRATE (PF) 100 MCG/2ML IJ SOLN
INTRAMUSCULAR | Status: DC | PRN
  Administered 2021-06-07 (×5): 50 ug via INTRAVENOUS

## 2021-06-07 MED ORDER — SUGAMMADEX SODIUM 500 MG/5ML IV SOLN
INTRAVENOUS | Status: DC | PRN
  Administered 2021-06-07: 200 mg via INTRAVENOUS

## 2021-06-07 MED ORDER — CEFAZOLIN SODIUM-DEXTROSE 2-4 GM/100ML-% IV SOLN
2.0000 g | Freq: Once | INTRAVENOUS | Status: AC
  Administered 2021-06-07: 2 g via INTRAVENOUS

## 2021-06-07 MED ORDER — DEXAMETHASONE SODIUM PHOSPHATE 10 MG/ML IJ SOLN
INTRAMUSCULAR | Status: AC
  Filled 2021-06-07: qty 1

## 2021-06-07 MED ORDER — SODIUM CHLORIDE 0.9 % IV SOLN: 250.0000 mL | INTRAVENOUS | Status: DC | PRN

## 2021-06-07 MED ORDER — LIDOCAINE HCL (PF) 2 % IJ SOLN
INTRAMUSCULAR | Status: AC
  Filled 2021-06-07: qty 5

## 2021-06-07 MED ORDER — DIPHENHYDRAMINE HCL 12.5 MG/5ML PO ELIX: 12.5000 mg | ORAL_SOLUTION | Freq: Four times a day (QID) | ORAL | Status: DC | PRN

## 2021-06-07 MED ORDER — MIDAZOLAM HCL 2 MG/2ML IJ SOLN
INTRAMUSCULAR | Status: AC
  Filled 2021-06-07: qty 2

## 2021-06-07 MED ORDER — DIPHENHYDRAMINE HCL 50 MG/ML IJ SOLN: 12.5000 mg | Freq: Four times a day (QID) | INTRAMUSCULAR | Status: DC | PRN

## 2021-06-07 MED ORDER — PANTOPRAZOLE SODIUM 40 MG PO TBEC
40.0000 mg | DELAYED_RELEASE_TABLET | Freq: Every day | ORAL | Status: DC
  Administered 2021-06-08 – 2021-06-09 (×2): 40 mg via ORAL
  Filled 2021-06-07 (×2): qty 1

## 2021-06-07 MED ORDER — DOCUSATE SODIUM 100 MG PO CAPS
100.0000 mg | ORAL_CAPSULE | Freq: Two times a day (BID) | ORAL | Status: DC
  Administered 2021-06-07 – 2021-06-09 (×5): 100 mg via ORAL
  Filled 2021-06-07 (×5): qty 1

## 2021-06-07 MED ORDER — HYDROMORPHONE HCL 1 MG/ML IJ SOLN
INTRAMUSCULAR | Status: AC
  Administered 2021-06-07: 0.5 mg via INTRAVENOUS
  Filled 2021-06-07: qty 3

## 2021-06-07 MED ORDER — KETAMINE HCL 10 MG/ML IJ SOLN
INTRAMUSCULAR | Status: DC | PRN
  Administered 2021-06-07: 30 mg via INTRAVENOUS
  Administered 2021-06-07: 10 mg via INTRAVENOUS

## 2021-06-07 MED ORDER — HYDROMORPHONE HCL 1 MG/ML IJ SOLN
0.5000 mg | INTRAMUSCULAR | Status: DC | PRN
  Administered 2021-06-07: 1 mg via INTRAVENOUS
  Filled 2021-06-07: qty 1

## 2021-06-07 MED ORDER — LIDOCAINE 2% (20 MG/ML) 5 ML SYRINGE
INTRAMUSCULAR | Status: DC | PRN
  Administered 2021-06-07: 100 mg via INTRAVENOUS

## 2021-06-07 MED ORDER — PHENYLEPHRINE 80 MCG/ML (10ML) SYRINGE FOR IV PUSH (FOR BLOOD PRESSURE SUPPORT)
PREFILLED_SYRINGE | INTRAVENOUS | Status: DC | PRN
  Administered 2021-06-07 (×3): 80 ug via INTRAVENOUS

## 2021-06-07 MED ORDER — SODIUM CHLORIDE (PF) 0.9 % IJ SOLN
INTRAMUSCULAR | Status: DC | PRN
  Administered 2021-06-07: 20 mL

## 2021-06-07 MED ORDER — EPHEDRINE SULFATE-NACL 50-0.9 MG/10ML-% IV SOSY
PREFILLED_SYRINGE | INTRAVENOUS | Status: DC | PRN
  Administered 2021-06-07: 5 mg via INTRAVENOUS

## 2021-06-07 MED ORDER — CITALOPRAM HYDROBROMIDE 20 MG PO TABS
40.0000 mg | ORAL_TABLET | Freq: Every day | ORAL | Status: DC
Start: 2021-06-08 — End: 2021-06-09
  Administered 2021-06-08 – 2021-06-09 (×2): 40 mg via ORAL
  Filled 2021-06-07 (×2): qty 2

## 2021-06-07 MED ORDER — ORAL CARE MOUTH RINSE: 15.0000 mL | Freq: Once | OROMUCOSAL | Status: AC

## 2021-06-07 MED ORDER — LACTATED RINGERS IR SOLN
Status: DC | PRN
  Administered 2021-06-07: 1000 mL

## 2021-06-07 MED ORDER — ONDANSETRON HCL 4 MG/2ML IJ SOLN
INTRAMUSCULAR | Status: DC | PRN
  Administered 2021-06-07: 4 mg via INTRAVENOUS

## 2021-06-07 MED ORDER — LIDOCAINE 5 % EX PTCH: 1.0000 | MEDICATED_PATCH | Freq: Every day | CUTANEOUS | Status: DC | PRN

## 2021-06-07 MED ORDER — SODIUM CHLORIDE (PF) 0.9 % IJ SOLN
INTRAMUSCULAR | Status: AC
  Filled 2021-06-07: qty 20

## 2021-06-07 MED ORDER — MIDAZOLAM HCL 5 MG/5ML IJ SOLN
INTRAMUSCULAR | Status: DC | PRN
  Administered 2021-06-07: 2 mg via INTRAVENOUS

## 2021-06-07 MED ORDER — ONDANSETRON HCL 4 MG/2ML IJ SOLN
4.0000 mg | INTRAMUSCULAR | Status: DC | PRN
  Administered 2021-06-07 – 2021-06-08 (×3): 4 mg via INTRAVENOUS
  Filled 2021-06-07 (×3): qty 2

## 2021-06-07 MED ORDER — CEFAZOLIN SODIUM-DEXTROSE 1-4 GM/50ML-% IV SOLN
1.0000 g | Freq: Three times a day (TID) | INTRAVENOUS | Status: AC
  Administered 2021-06-07 (×2): 1 g via INTRAVENOUS
  Filled 2021-06-07 (×2): qty 50

## 2021-06-07 MED ORDER — GLYCOPYRROLATE 0.2 MG/ML IJ SOLN
INTRAMUSCULAR | Status: DC | PRN
  Administered 2021-06-07: .2 mg via INTRAVENOUS

## 2021-06-07 SURGICAL SUPPLY — 84 items
ADH SKN CLS APL DERMABOND .7 (GAUZE/BANDAGES/DRESSINGS) ×2
APL LAPSCP 35 DL APL RGD (MISCELLANEOUS) ×1
APL PRP STRL LF DISP 70% ISPRP (MISCELLANEOUS) ×1
APL SRG 32X5 SNPLK LF DISP (MISCELLANEOUS)
APPLICATOR VISTASEAL 35 (MISCELLANEOUS) ×3 IMPLANT
BAG COUNTER SPONGE SURGICOUNT (BAG) IMPLANT
BAG RETRIEVAL 10 (BASKET) ×1
BAG SPNG CNTER NS LX DISP (BAG)
CHLORAPREP W/TINT 26 (MISCELLANEOUS) ×3 IMPLANT
CLIP LIGATING HEM O LOK PURPLE (MISCELLANEOUS) ×3 IMPLANT
CLIP LIGATING HEMO LOK XL GOLD (MISCELLANEOUS) IMPLANT
CLIP LIGATING HEMO O LOK GREEN (MISCELLANEOUS) ×6 IMPLANT
CLIP SUT LAPRA TY ABSORB (SUTURE) ×7 IMPLANT
COVER SURGICAL LIGHT HANDLE (MISCELLANEOUS) ×3 IMPLANT
COVER TIP SHEARS 8 DVNC (MISCELLANEOUS) ×2 IMPLANT
COVER TIP SHEARS 8MM DA VINCI (MISCELLANEOUS) ×2
CUTTER ECHEON FLEX ENDO 45 340 (ENDOMECHANICALS) IMPLANT
DERMABOND ADVANCED (GAUZE/BANDAGES/DRESSINGS) ×2
DERMABOND ADVANCED .7 DNX12 (GAUZE/BANDAGES/DRESSINGS) ×2 IMPLANT
DRAIN CHANNEL 15F RND FF 3/16 (WOUND CARE) IMPLANT
DRAPE ARM DVNC X/XI (DISPOSABLE) ×8 IMPLANT
DRAPE COLUMN DVNC XI (DISPOSABLE) ×2 IMPLANT
DRAPE DA VINCI XI ARM (DISPOSABLE) ×8
DRAPE DA VINCI XI COLUMN (DISPOSABLE) ×2
DRAPE INCISE IOBAN 66X45 STRL (DRAPES) ×3 IMPLANT
DRAPE SHEET LG 3/4 BI-LAMINATE (DRAPES) ×3 IMPLANT
ELECT PENCIL ROCKER SW 15FT (MISCELLANEOUS) ×3 IMPLANT
ELECT REM PT RETURN 15FT ADLT (MISCELLANEOUS) ×3 IMPLANT
EVACUATOR SILICONE 100CC (DRAIN) IMPLANT
GAUZE 4X4 16PLY ~~LOC~~+RFID DBL (SPONGE) ×3 IMPLANT
GLOVE BIO SURGEON STRL SZ 6.5 (GLOVE) ×3 IMPLANT
GLOVE BIOGEL PI IND STRL 8 (GLOVE) ×4 IMPLANT
GLOVE BIOGEL PI INDICATOR 8 (GLOVE) ×2
GLOVE SURG LX 7.5 STRW (GLOVE) ×2
GLOVE SURG LX STRL 7.5 STRW (GLOVE) ×4 IMPLANT
GOWN STRL REUS W/ TWL LRG LVL3 (GOWN DISPOSABLE) ×2 IMPLANT
GOWN STRL REUS W/ TWL XL LVL3 (GOWN DISPOSABLE) ×4 IMPLANT
GOWN STRL REUS W/TWL LRG LVL3 (GOWN DISPOSABLE) ×2
GOWN STRL REUS W/TWL XL LVL3 (GOWN DISPOSABLE) ×4
HEMOSTAT SURGICEL 4X8 (HEMOSTASIS) ×3 IMPLANT
HOLDER FOLEY CATH W/STRAP (MISCELLANEOUS) ×3 IMPLANT
IRRIG SUCT STRYKERFLOW 2 WTIP (MISCELLANEOUS) ×2
IRRIGATION SUCT STRKRFLW 2 WTP (MISCELLANEOUS) ×2 IMPLANT
KIT BASIN OR (CUSTOM PROCEDURE TRAY) ×3 IMPLANT
KIT TURNOVER KIT A (KITS) IMPLANT
LOOP VESSEL MAXI BLUE (MISCELLANEOUS) IMPLANT
MARKER SKIN DUAL TIP RULER LAB (MISCELLANEOUS) ×3 IMPLANT
NDL INSUFFLATION 14GA 120MM (NEEDLE) ×2 IMPLANT
NEEDLE INSUFFLATION 14GA 120MM (NEEDLE) ×2 IMPLANT
PROTECTOR NERVE ULNAR (MISCELLANEOUS) ×6 IMPLANT
RELOAD STAPLE 45 2.6 WHT THIN (STAPLE) IMPLANT
SCISSORS LAP 5X45 EPIX DISP (ENDOMECHANICALS) ×3 IMPLANT
SEAL CANN UNIV 5-8 DVNC XI (MISCELLANEOUS) ×8 IMPLANT
SEAL XI 5MM-8MM UNIVERSAL (MISCELLANEOUS) ×8
SEALANT SURGICAL APPL DUAL CAN (MISCELLANEOUS) IMPLANT
SET TUBE SMOKE EVAC HIGH FLOW (TUBING) ×3 IMPLANT
SOLUTION ELECTROLUBE (MISCELLANEOUS) ×3 IMPLANT
SPIKE FLUID TRANSFER (MISCELLANEOUS) ×3 IMPLANT
STAPLE RELOAD 45 WHT (STAPLE) IMPLANT
STAPLE RELOAD 45MM WHITE (STAPLE)
SUT ETHILON 2 0 PSLX (SUTURE) IMPLANT
SUT MNCRL AB 4-0 PS2 18 (SUTURE) ×6 IMPLANT
SUT PDS AB 0 CT1 36 (SUTURE) IMPLANT
SUT V-LOC BARB 180 2/0GR6 GS22 (SUTURE)
SUT VIC AB 1 CT1 36 (SUTURE) ×12 IMPLANT
SUT VIC AB 2-0 SH 27 (SUTURE) ×2
SUT VIC AB 2-0 SH 27X BRD (SUTURE) IMPLANT
SUT VIC AB 3-0 SH 27 (SUTURE) ×2
SUT VIC AB 3-0 SH 27XBRD (SUTURE) IMPLANT
SUT VICRYL 0 UR6 27IN ABS (SUTURE) IMPLANT
SUT VLOC BARB 180 ABS3/0GR12 (SUTURE) ×4
SUTURE V-LC BRB 180 2/0GR6GS22 (SUTURE) IMPLANT
SUTURE VLOC BRB 180 ABS3/0GR12 (SUTURE) ×4 IMPLANT
SYS BAG RETRIEVAL 10MM (BASKET) ×1
SYSTEM BAG RETRIEVAL 10MM (BASKET) ×2 IMPLANT
TOWEL OR 17X26 10 PK STRL BLUE (TOWEL DISPOSABLE) ×3 IMPLANT
TOWEL OR NON WOVEN STRL DISP B (DISPOSABLE) ×3 IMPLANT
TRAY FOLEY MTR SLVR 16FR STAT (SET/KITS/TRAYS/PACK) ×3 IMPLANT
TRAY LAPAROSCOPIC (CUSTOM PROCEDURE TRAY) ×3 IMPLANT
TROCAR KII 12X100 BLADELESS (ENDOMECHANICALS) ×3 IMPLANT
TROCAR UNIVERSAL OPT 12M 100M (ENDOMECHANICALS) IMPLANT
TROCAR Z-THREAD FIOS 12X100MM (TROCAR) ×1 IMPLANT
TROCAR Z-THREAD OPTICAL 5X100M (TROCAR) IMPLANT
WATER STERILE IRR 1000ML POUR (IV SOLUTION) ×3 IMPLANT

## 2021-06-07 NOTE — H&P (Signed)
Urology Preoperative H&P   Chief Complaint: Left renal mass  History of Present Illness: Jacqueline Vance is a 61 y.o. female with a solid and enhancing 1.6 cm left upper pole renal mass with features concerning for renal cell carcinoma. The mass was initially discovered on CT stone study from 03/31/2020 during evaluation for acute flank pain. She does have a prior history of kidney stones. The mass was further characterized via CT abdomen/pelvis with and without contrast from 04/11/2021.   -Noted to have severe lumbar scoliosis on CT  -Normal right kidney  -Baseline serum creatinine less than 1.0  -No significant stone burden seen involving either kidney  -Hx of vaginal hysterectomy--no other abdominal surgeries  -No prior hx of smoking but was exposed to second hand smoke as a child   Past Medical History:  Diagnosis Date   Adult ADHD    Anxiety    Aortic atherosclerosis ([place]) 08/2016   Chronic back pain    neck, too.  MRI L spine 05/2008: 40 degrees dextroscoliosis due to a hemivertebra of L3.  Some DDD/spondylosis w/out spinal stenosis.   Colon cancer screening    04/2021 Cologuard negative.   Complication of anesthesia    drop in BP, difficult to wake up, shallow breathing   Depression    Diastolic dysfunction, left ventricle 05/2018   Echo with grd I DD.   GERD (gastroesophageal reflux disease)    Gout    2 attacks total as of 12/2014.   Heart murmur    Kidney lesion, native, left 03/2021   lesion consistent with renal cell carcinoma on imaging--> urology following/reviewing treatment options with patient as of 04/2021.   Kidney stones 2005; 2017; 2018   Most recent CT 08/2016 showed 3 mm nonobstructing L nephrolithiasis.   Migraine    Proph tx with topamax helps, abortive tx with relpax helps.  Couldn't afford topamax so d/c'd this 04/2016.     Navicular fracture, foot 03/2018   Right Navicular fx-nonunion (surgery)   Pulmonary nodule 2018   5 mm LLL, stable on 2 yr f/u imaging  2022-->benign, no further imaging needed.   Restless legs syndrome    Pt can get to sleep fine so she declines med for this as of 08/2016    Past Surgical History:  Procedure Laterality Date   BREAST SURGERY  1999   reduction   ENDOMETRIAL ABLATION  2002   FOOT FRACTURE SURGERY  03/2018   Closed displaced fracture of right navicular, nonunion.   KIDNEY STONE SURGERY  2005   LAPAROSCOPIC VAGINAL HYSTERECTOMY  12/02/2007   for DUB   LUMBAR ESI  2021   no help ([place])   REDUCTION MAMMAPLASTY     TRANSTHORACIC ECHOCARDIOGRAM  06/13/2018   Grd I DD   WISDOM TOOTH EXTRACTION      Allergies:  Allergies  Allergen Reactions   Imitrex [Sumatriptan] Other (See Comments)    Face and Arm numbness    Family History  Problem Relation Age of Onset   Cancer Mother        brain ca (glioblastoma)   Breast cancer Paternal Aunt    Breast cancer Maternal Grandmother     Social History:  reports that she has never smoked. She has never used smokeless tobacco. She reports that she does not drink alcohol and does not use drugs.  ROS: A complete review of systems was performed.  All systems are negative except for pertinent findings as noted.  Physical Exam:  Vital signs  in last 24 hours: Temp:  [97.7 F (36.5 C)] 97.7 F (36.5 C) (05/23 5784) Pulse Rate:  [56] 56 (05/23 0613) Resp:  [18] 18 (05/23 6962) BP: (123)/(82) 123/82 (05/23 0613) SpO2:  [100 %] 100 % (05/23 9528) Weight:  [65.6 kg] 65.6 kg (05/23 0622) Constitutional:  Alert and oriented, No acute distress Cardiovascular: Regular rate and rhythm, No JVD Respiratory: Normal respiratory effort, Lungs clear bilaterally GI: Abdomen is soft, nontender, nondistended, no abdominal masses GU: No CVA tenderness Lymphatic: No lymphadenopathy Neurologic: Grossly intact, no focal deficits Psychiatric: Normal mood and affect  Laboratory Data:  No results for input(s): WBC, HGB, HCT, PLT in the last 72 hours.  No results for  input(s): NA, K, CL, GLUCOSE, BUN, CALCIUM, CREATININE in the last 72 hours.  Invalid input(s): CO3   No results found for this or any previous visit (from the past 24 hour(s)). No results found for this or any previous visit (from the past 240 hour(s)).  Renal Function: Recent Labs    06/01/21 0933  CREATININE 0.81   Estimated Creatinine Clearance: 69 mL/min (by C-G formula based on SCr of 0.81 mg/dL).  Radiologic Imaging: CLINICAL DATA: Bilateral flank pain, questionable left renal mass.  History of renal stones.   * Tracking Code: BO *   EXAM:  CT ABDOMEN AND PELVIS WITHOUT AND WITH CONTRAST   TECHNIQUE:  Multidetector CT imaging of the abdomen and pelvis was performed  following the standard protocol before and following the bolus  administration of intravenous contrast.   RADIATION DOSE REDUCTION: This exam was performed according to the  departmental dose-optimization program which includes automated  exposure control, adjustment of the mA and/or kV according to  patient size and/or use of iterative reconstruction technique.   CONTRAST: 125 cc of Omnipaque 300   COMPARISON: CT March 31, 2020   FINDINGS:  Lower chest: No acute abnormality.   Hepatobiliary: No suspicious hepatic lesion. Gallbladder is  unremarkable. No biliary ductal dilation.   Pancreas: No pancreatic ductal dilation or evidence of acute  inflammation.   Spleen: No splenomegaly or focal splenic lesion.   Adrenals/Urinary Tract: Bilateral adrenal glands appear normal.   No hydronephrosis. No renal, ureteral or bladder calculi identified.   Solid enhancing 15 x 12 x 16 mm left upper pole renal lesion on  images 27/5 and 80/604 correspond with the nodularity/contour  abnormality seen on prior CT and is most consistent with renal cell  carcinoma.   No additional solid enhancing renal masses.   Kidneys demonstrate symmetric enhancement and excretion of contrast  material. No collecting  system duplication. No suspicious filling  defect visualized within the opacified portions of the collecting  systems or ureters on delayed imaging. However, the distal right  ureter is not well opacified on either sequence of delayed imaging  limiting evaluation.   Urinary bladder is grossly unremarkable for degree of distension  without abnormal wall thickening or suspicious intraluminal filling  defect identified.   Stomach/Bowel: No enteric contrast was administered. Stomach is  unremarkable for degree of distension. No pathologic dilation of  small or large bowel. Terminal ileum appears normal. Appendix is not  confidently identified in its entirety however there is no pericecal  inflammation. Sigmoid colonic diverticulosis without findings of  acute diverticulitis.   Vascular/Lymphatic: Aortic and branch vessel atherosclerosis without  abdominal aortic aneurysm. No evidence of tumor in renal vein. No  pathologically enlarged abdominal or pelvic lymph nodes.   Reproductive: Status post hysterectomy. No adnexal  masses.   Other: No significant abdominopelvic free fluid.   Musculoskeletal: No aggressive lytic or blastic lesion of bone.  Dextroconvex scoliotic curvature of the spine associated with  hemivertebra anomaly at L3 on the left with similar surrounding  degenerative change.Marland Kitchen   IMPRESSION:  1. Solid enhancing 15 mm left upper pole renal lesion, most  consistent with renal cell carcinoma.  2. No evidence of tumor in renal vein or abdominopelvic metastatic  disease.  3. Sigmoid colonic diverticulosis without findings of acute  diverticulitis.  4. Aortic Atherosclerosis (ICD10-I70.0).    Electronically Signed  By: Dahlia Bailiff M.D.  On: 04/12/2021 17:42    I independently reviewed the above imaging studies.  Assessment and Plan Jacqueline Vance is a 61 y.o. female with 1.6 cm left renal mass  -I personally reviewed imaging results and films with the patient. We  discussed that the mass in question has features concerning for malignancy. I explained the natural history of presumed renal cell carcinoma. I reviewed the AUA guidelines for evaluation and treatment of the small renal mass. The options of active surveillance, in situ tumor ablation, partial and radical nephrectomy was discussed. The risks of robotic partial LEFT nephrectomy were discussed in detail including but not limited to: negative pathology, open conversion, completion nephrectomy, infection of the urinary tract/skin/abdominal cavity, VTE, MI/CVA, lymphatic leak, injury to adjacent solid/hollow viscus organs, bleeding requiring a blood transfusion, catastrophic bleeding, hernia formation, need for postoperative angioembolization, urinary leak requiring stent/drain, and other imponderables.    Ellison Hughs, MD 06/07/2021, 7:09 AM  [facility] Pager: [phone]

## 2021-06-07 NOTE — Anesthesia Postprocedure Evaluation (Signed)
Anesthesia Post Note  Patient: Jacqueline Vance  Procedure(s) Performed: XI ROBOTIC ASSITED PARTIAL NEPHRECTOMY (Left)     Patient location during evaluation: PACU Anesthesia Type: General Level of consciousness: awake Pain management: pain level controlled Vital Signs Assessment: post-procedure vital signs reviewed and stable Respiratory status: spontaneous breathing Cardiovascular status: stable Postop Assessment: no apparent nausea or vomiting Anesthetic complications: no   No notable events documented.  Last Vitals:  Vitals:   06/07/21 1045 06/07/21 1100  BP: 114/64 (!) 103/59  Pulse: 64 77  Resp: 15 14  Temp: (!) 36.4 C   SpO2: 100% 100%    Last Pain:  Vitals:   06/07/21 1100  TempSrc:   PainSc: [place] Jr

## 2021-06-07 NOTE — Anesthesia Procedure Notes (Addendum)
Procedure Name: Intubation Date/Time: 06/07/2021 7:28 AM Performed by: West Pugh, CRNA Pre-anesthesia Checklist: Patient identified, Emergency Drugs available, Suction available and Patient being monitored Patient Re-evaluated:Patient Re-evaluated prior to induction Oxygen Delivery Method: Circle system utilized Preoxygenation: Pre-oxygenation with 100% oxygen Induction Type: IV induction Ventilation: Mask ventilation without difficulty Laryngoscope Size: Miller and 2 Grade View: Grade I Tube type: Oral Tube size: 7.0 mm Number of attempts: 1 Airway Equipment and Method: Stylet Placement Confirmation: ETT inserted through vocal cords under direct vision, positive ETCO2 and breath sounds checked- equal and bilateral Secured at: 23 cm Tube secured with: Tape Dental Injury: Teeth and Oropharynx as per pre-operative assessment  Comments: DL and intubation x1 by Liane Comber, SRNA. Atraumatic, +etCO2, +bilateral breath sounds

## 2021-06-07 NOTE — Progress Notes (Signed)
Patient admitted to [id number] in NAD. VSS. Patient oriented to unit and call bell. Family at bedside. Falls precautions in place.

## 2021-06-07 NOTE — Addendum Note (Signed)
Addendum  created 06/07/21 1136 by West Pugh, CRNA   Intraprocedure Event edited

## 2021-06-07 NOTE — Op Note (Signed)
Operative Note  Preoperative diagnosis:  1.  1.6 cm left renal mass  Postoperative diagnosis: 1.  1.6 cm left renal mass  Procedure(s): 1.  Robot-assisted laparoscopic left partial nephrectomy 2.  Intraoperative ultrasound of single retroperitoneal organ  Surgeon: Ellison Hughs, MD  Assistants: Davis Gourd, MD PGY 4  Anesthesia:  General  Complications:  None  EBL: 50 mL  Specimens: 1.  Left renal mass  Drains/Catheters: 1.  Foley catheter  Intraoperative findings:   Exophytic left upper pole renal mass was excised with grossly negative surgical margins The renorrhaphy was hemostatic at the conclusion of the case  Indication:  Jacqueline Vance is a 61 y.o. female with a solid and enhancing 1.6 cm left renal mass with features concerning for renal cell carcinoma.  She has been consented for the above procedures, voices understanding and wishes to proceed.  Description of procedure:  After informed consent was obtained, the patient was brought to the operating room and general endotracheal anesthesia was administered.  A 16 Staver Foley catheter was then sterilely placed and set to gravity drainage.  The patient was then placed in the right lateral decubitus position and prepped and draped in usual sterile fashion.  A timeout was performed.  An 8 mm incision was then made lateral to the left rectus muscle at the level of the left 12th rib.  Abdominal access was obtained via a Veress needle.  The abdominal cavity was then insufflated up to 15 mmHg.  An 8 mm port was then introduced into the abdominal cavity.  Inspection of the port entry site by the robotic camera revealed no adjacent organ injury.  We then placed 3 additional 8 mm robotic ports to triangulate the left renal hilum.  A 12 mm assistant port was then placed between the carmera port and 3rd robotic arm.  The white line of Toldt along the descending colon was incised sharply and the colon, along with its mesocolonic  fat, was reflected medially until the aorta was identified.  We then made a small window adjacent to the lower pole of the left kidney, identifying the left psoas muscle, left ureter and left gonadal vein.  The left ureter and gonadal vein were then reflected anteriorly allowing [place] to then incised the perihilar attachments using electrocautery.  We encountered a small lumbar vein adjacent to the insertion of the left gonadal vein into the left renal vein.  This lumbar vein was ligated with hemo-lock clips in 2 places and incised sharply.  This provided [place] excellent exposure to the left renal hilum.  The perilymphatic tissue surrounding the left renal artery was carefully dissected away, creating a window to place a bulldog clamp later in the procedure.  The anterior portion of Gerota's fascia was incised, allowing reflection the perinephric fat medially and laterally until there was adequate exposure of the upper pole left renal mass.  Intraoperative ultrasound confirmed the heterogenous echogenicity of the lesion compared to the remainder of the renal parenchyma and allowed identification of the depth/borders of the mass, which were demarcated using electrocautery along the renal capsule.  We then exposed the left renal artery and placed a bulldog clamp, marking warm ischemia time.  The left kidney immediately became ischemic and pale in appearance.  The left renal mass was then sharply excised with minimal blood loss.  After the mass was free, it was placed in the left upper quadrant to be retrieved later on during the operation.    The renorrhaphy was then  performed using a 3-0 V-lock in the deep layer of the renal parenchyma.  The bulldog clamp was then removed marking warm ischemia time at 16 minutes.   A series of 1-0 Vicryl sutures with Hem-o-lok clips acting as a buttress were then used to reapproximate the renal capsule.  There did not appear to be any obvious bleeding around the renal hilum nor  surrounding our repair.  The incised Gerota's fascia overlying the mass was then reapproximated using a running 2-0 V lock suture.  The mass was then placed in an Endo Catch bag.  Surgicel and Vistaseal were then applied to the resection bed.  The robot was then de-docked and the camera was then reinserted into the assistant port. Laparoscopic graspers were then used to grab the string of the Endo Catch bag, which was brought out through the 12 mm assistant port.  The abdomen was then desufflated and all ports were removed.  The assistant port incision was then extended approximately 1-2 cm and the left renal mass, within the Endo Catch bag, was removed and sent to pathology for permanent section.  The fascia within the assistant port incision was then reapproximated using a 0 Vicryl suture.  The remainder of the incisions were then closed using 4-0 Monocryl and dressed appropriately.  Patient tolerated the procedure well and was transferred to the postanesthesia unit in stable condition.  Plan:  Monitor on the floor overmight

## 2021-06-07 NOTE — Transfer of Care (Signed)
Immediate Anesthesia Transfer of Care Note  Patient: Jacqueline Vance  Procedure(s) Performed: XI ROBOTIC ASSITED PARTIAL NEPHRECTOMY (Left)  Patient Location: PACU  Anesthesia Type:General  Level of Consciousness: drowsy, patient cooperative and responds to stimulation  Airway & Oxygen Therapy: Patient Spontanous Breathing and Patient connected to face mask  Post-op Assessment: Report given to RN and Post -op Vital signs reviewed and stable  Post vital signs: Reviewed and stable  Last Vitals:  Vitals Value Taken Time  BP 116/51   Temp    Pulse 73 06/07/21 1016  Resp 16 06/07/21 1016  SpO2 100 % 06/07/21 1016  Vitals shown include unvalidated device data.  Last Pain:  Vitals:   06/07/21 0613  TempSrc: Oral      Patients Stated Pain Goal: 2 (03/88/82 8003)  Complications: No notable events documented.

## 2021-06-07 NOTE — Anesthesia Preprocedure Evaluation (Addendum)
Anesthesia Evaluation  Patient identified by MRN, date of birth, ID band Patient awake    Reviewed: Allergy & Precautions, NPO status , Patient's Chart, lab work & pertinent test results  History of Anesthesia Complications (+) history of anesthetic complications  Airway Mallampati: I       Dental no notable dental hx.    Pulmonary asthma ,    Pulmonary exam normal        Cardiovascular negative cardio ROS Normal cardiovascular exam     Neuro/Psych  Headaches, PSYCHIATRIC DISORDERS Anxiety Depression  Neuromuscular disease    GI/Hepatic Neg liver ROS, GERD  Medicated and Controlled,  Endo/Other  negative endocrine ROS  Renal/GU   negative genitourinary   Musculoskeletal negative musculoskeletal ROS (+)   Abdominal Normal abdominal exam  (+)   Peds negative pediatric ROS (+)  Hematology negative hematology ROS (+)   Anesthesia Other Findings   Reproductive/Obstetrics negative OB ROS                            Anesthesia Physical Anesthesia Plan  ASA: 2  Anesthesia Plan: General   Post-op Pain Management:    Induction: Intravenous  PONV Risk Score and Plan: 4 or greater and Ondansetron, Dexamethasone and Midazolam  Airway Management Planned: Oral ETT  Additional Equipment: None  Intra-op Plan:   Post-operative Plan: Extubation in OR  Informed Consent: I have reviewed the patients History and Physical, chart, labs and discussed the procedure including the risks, benefits and alternatives for the proposed anesthesia with the patient or authorized representative who has indicated his/her understanding and acceptance.     Dental advisory given  Plan Discussed with: CRNA  Anesthesia Plan Comments:        Anesthesia Quick Evaluation

## 2021-06-08 ENCOUNTER — Encounter (HOSPITAL_COMMUNITY): Payer: Self-pay | Admitting: Urology

## 2021-06-08 DIAGNOSIS — C642 Malignant neoplasm of left kidney, except renal pelvis: Secondary | ICD-10-CM | POA: Diagnosis not present

## 2021-06-08 LAB — BASIC METABOLIC PANEL
Anion gap: 6 (ref 5–15)
BUN: 8 mg/dL (ref 6–20)
CO2: 25 mmol/L (ref 22–32)
Calcium: 8.4 mg/dL — ABNORMAL LOW (ref 8.9–10.3)
Chloride: 104 mmol/L (ref 98–111)
Creatinine, Ser: 0.86 mg/dL (ref 0.44–1.00)
GFR, Estimated: >60 mL/min (ref >60)
Glucose, Bld: 163 mg/dL — ABNORMAL HIGH (ref 70–99)
Potassium: 3.8 mmol/L (ref 3.5–5.1)
Sodium: 135 mmol/L (ref 135–145)

## 2021-06-08 LAB — CBC
HCT: 34.6 % — ABNORMAL LOW (ref 36.0–46.0)
Hemoglobin: 11.7 g/dL — ABNORMAL LOW (ref 12.0–15.0)
MCH: 30.6 pg (ref 26.0–34.0)
MCHC: 33.8 g/dL (ref 30.0–36.0)
MCV: 90.6 fL (ref 80.0–100.0)
Platelets: 148 10*3/uL — ABNORMAL LOW (ref 150–400)
RBC: 3.82 MIL/uL — ABNORMAL LOW (ref 3.87–5.11)
RDW: 12.3 % (ref 11.5–15.5)
WBC: 11.6 10*3/uL — ABNORMAL HIGH (ref 4.0–10.5)
nRBC: 0 % (ref 0.0–0.2)

## 2021-06-08 LAB — SURGICAL PATHOLOGY

## 2021-06-08 MED ORDER — ELETRIPTAN HYDROBROMIDE 40 MG PO TABS
40.0000 mg | ORAL_TABLET | ORAL | Status: DC | PRN
  Administered 2021-06-08 – 2021-06-09 (×3): 40 mg via ORAL
  Filled 2021-06-08 (×5): qty 1

## 2021-06-08 NOTE — Discharge Summary (Signed)
Date of admission: 06/07/2021  Date of discharge: 06/09/2021  Admission diagnosis: Left renal mass   Discharge diagnosis: Left renal mass   Secondary diagnoses:  Patient Active Problem List   Diagnosis Date Noted   Renal mass 06/07/2021   Lumbar radiculopathy 07/26/2020   Episode of recurrent major depressive disorder (HCC) 01/29/2020   Pain in right foot 03/14/2018   Pain in joint of right elbow 03/14/2018   Migraines 01/23/2018   Chronic mixed headache syndrome 08/03/2014   Cervicalgia 08/03/2014   Chronic low back pain 08/03/2014   Anxiety and depression 08/03/2014   Adult ADHD 07/30/2013   GAD (generalized anxiety disorder) 07/17/2013   Health maintenance examination 08/12/2012   Lymphadenitis 05/26/2011   Viral syndrome 05/26/2011   Fatigue 05/26/2011   Vitamin D deficiency 05/26/2011   Vitamin B12 deficiency 05/26/2011   Overweight(278.02) 01/26/2011   Chronic back pain 08/10/2010   Depression 08/10/2010   History of migraine headaches 08/10/2010   Preventative health care 08/10/2010    Procedures performed: Procedure(s): XI ROBOTIC ASSITED PARTIAL NEPHRECTOMY  History and Physical: For full details, please see admission history and physical. Briefly, Jacqueline Vance is a 60 y.o. year old patient with a small left renal mass who presented for robotic assisted left partial nephrectomy.   Hospital Course: Patient tolerated the procedure well.  She was then transferred to the floor after an uneventful PACU stay.  Her hospital course was uncomplicated.  She was on bedrest overnight in POD0. By the morning of POD1 her abdominal pain was well controlled. She had a migraine with nausea and emesis which she gets at baseline. She felt much improved after taking a dose of her home triptan and zofran. Her morning labs were stable. On POD1 her catheter was removed and she passed a trial of void. She tolerated a regular diet without nausea or vomiting. She was taken off bedrest and  was able to ambulate without difficulty. She remained admitted until POD2 to ensure her nausea did not return. On POD#2 she had met discharge criteria: was eating a regular diet, was up and ambulating independently,  pain was well controlled, was voiding without a catheter, and was ready to for discharge.   Laboratory values:  Recent Labs    06/07/21 1319 06/08/21 0345  WBC  --  11.6*  HGB 12.1 11.7*  HCT 36.5 34.6*   Recent Labs    06/07/21 1319 06/08/21 0345  NA 141 135  K 3.5 3.8  CL 107 104  CO2 28 25  GLUCOSE 134* 163*  BUN 7 8  CREATININE 0.75 0.86  CALCIUM 8.7* 8.4*   No results for input(s): LABPT, INR in the last 72 hours. No results for input(s): LABURIN in the last 72 hours. Results for orders placed or performed in visit on 03/09/20  Urine Culture     Status: None   Collection Time: 03/09/20 12:12 PM   Specimen: Urine  Result Value Ref Range Status   MICRO NUMBER: [id number]  Final   SPECIMEN QUALITY: Adequate  Final   Sample Source NOT GIVEN  Final   STATUS: FINAL  Final   ISOLATE 1:   Final    Mixed genital flora isolated. These superficial bacteria are not indicative of a urinary tract infection. No further organism identification is warranted on this specimen. If clinically indicated, recollect clean-catch, mid-stream urine and transfer  immediately to Urine Culture Transport Tube.     Disposition: Home  Discharge instruction: The patient was instructed to   be ambulatory but told to refrain from heavy lifting, strenuous activity, or driving.   Discharge medications:  Allergies as of 06/09/2021       Reactions   Imitrex [sumatriptan] Other (See Comments)   Face and Arm numbness        Medication List     TAKE these medications    albuterol 108 (90 Base) MCG/ACT inhaler Commonly known as: Ventolin HFA Inhale 2 puffs into the lungs every 4 (four) hours as needed for wheezing or shortness of breath.   ALPRAZolam 1 MG tablet Commonly known as:  XANAX TAKE ONE TABLET BY MOUTH TWICE A DAY AS NEEDED FOR ANXIETY   citalopram 40 MG tablet Commonly known as: CELEXA Take 1 tablet (40 mg total) by mouth daily.   colchicine 0.6 MG tablet 2 tabs po at onset of gout pain, then 1 tab po one hour later What changed:  how much to take how to take this when to take this additional instructions   eletriptan 40 MG tablet Commonly known as: RELPAX TAKED 1 TABLET BY MOUTH AT ONSET OF MIGRIANE HEADACHE MAY REPEAT IN 2 HOURS IF HEADACHE PERSISTS OR RECURS   fluorouracil 5 % cream Commonly known as: EFUDEX Apply to areas of sun damage on the face twice daily for 4-5 days   HYDROcodone-acetaminophen 5-325 MG tablet Commonly known as: NORCO/VICODIN Take 1 tablet by mouth 3 (three) times daily as needed.   ibuprofen 600 MG tablet Commonly known as: ADVIL TAKE ONE TABLET (600 MG TOTAL) BY MOUTH EVERY 8 HOURS AS NEEDED.   lidocaine 5 % Commonly known as: LIDODERM 1 patch to the affected area q12h prn What changed:  how much to take how to take this when to take this reasons to take this   methocarbamol 500 MG tablet Commonly known as: ROBAXIN TAKE 1 OR 2 TABLETS BY MOUTH EVERY 6 HOURS AS NEEDED FOR MUSCULAR RELAXATION   pantoprazole 40 MG tablet Commonly known as: PROTONIX TAKE ONE (1) TABLET BY MOUTH EVERY DAY   tiZANidine 4 MG tablet Commonly known as: ZANAFLEX 1 tab po qhs prn What changed:  how much to take how to take this when to take this reasons to take this   tretinoin 0.025 % cream Commonly known as: RETIN-A Apply 1 application topically at bedtime as needed. For acne prevention/control   VITAMIN B-12 PO Take 1 tablet by mouth daily. Reported on 04/07/2015         

## 2021-06-08 NOTE — TOC Initial Note (Signed)
Transition of Care [facility]) - Initial/Assessment Note    Patient Details  Name: Jacqueline Vance MRN: [id number] Date of Birth: Dec 03, 1960  Transition of Care [facility]) CM/SW Contact:    Leeroy Cha, RN Phone Number: 06/08/2021, 7:54 AM  Clinical Narrative:                  Transition of Care [facility]) Screening Note   Patient Details  Name: Jacqueline Vance Date of Birth: Aug 16, 1960   Transition of Care [facility]) CM/SW Contact:    Leeroy Cha, RN Phone Number: 06/08/2021, 7:54 AM    Transition of Care Department [facility]) has reviewed patient and no TOC needs have been identified at this time. We will continue to monitor patient advancement through interdisciplinary progression rounds. If new patient transition needs arise, please place a TOC consult.    Expected Discharge Plan: Home/Self Care Barriers to Discharge: No Barriers Identified   Patient Goals and CMS Choice Patient states their goals for this hospitalization and ongoing recovery are:: toi go home CMS Medicare.gov Compare Post Acute Care list provided to:: Patient Choice offered to / list presented to : Patient  Expected Discharge Plan and Services Expected Discharge Plan: Home/Self Care   Discharge Planning Services: CM Consult   Living arrangements for the past 2 months: Single Family Home                                      Prior Living Arrangements/Services Living arrangements for the past 2 months: Single Family Home Lives with:: Spouse Patient language and need for interpreter reviewed:: Yes Do you feel safe going back to the place where you live?: Yes            Criminal Activity/Legal Involvement Pertinent to Current Situation/Hospitalization: No - Comment as needed  Activities of Daily Living Home Assistive Devices/Equipment: None ADL Screening (condition at time of admission) Patient's cognitive ability adequate to safely complete daily activities?: Yes Is the patient deaf or have  difficulty hearing?: No Does the patient have difficulty seeing, even when wearing glasses/contacts?: No Does the patient have difficulty concentrating, remembering, or making decisions?: Yes Patient able to express need for assistance with ADLs?: Yes Does the patient have difficulty dressing or bathing?: No Independently performs ADLs?: Yes (appropriate for developmental age) Does the patient have difficulty walking or climbing stairs?: Yes Weakness of Legs: None Weakness of Arms/Hands: None  Permission Sought/Granted                  Emotional Assessment Appearance:: Appears stated age     Orientation: : Oriented to  Time, Oriented to Place, Oriented to Self, Oriented to Situation Alcohol / Substance Use: Not Applicable Psych Involvement: No (comment)  Admission diagnosis:  Renal mass [N28.89] Patient Active Problem List   Diagnosis Date Noted   Renal mass 06/07/2021   Lumbar radiculopathy 07/26/2020   Episode of recurrent major depressive disorder ([place]) 01/29/2020   Pain in right foot 03/14/2018   Pain in joint of right elbow 03/14/2018   Migraines 01/23/2018   Chronic mixed headache syndrome 08/03/2014   Cervicalgia 08/03/2014   Chronic low back pain 08/03/2014   Anxiety and depression 08/03/2014   Adult ADHD 07/30/2013   GAD (generalized anxiety disorder) 07/17/2013   Health maintenance examination 08/12/2012   Lymphadenitis 05/26/2011   Viral syndrome 05/26/2011   Fatigue 05/26/2011   Vitamin D deficiency 05/26/2011  Vitamin B12 deficiency 05/26/2011   Overweight(278.02) 01/26/2011   Chronic back pain 08/10/2010   Depression 08/10/2010   History of migraine headaches 08/10/2010   Preventative health care 08/10/2010   PCP:  Tammi Sou, MD Pharmacy:   [facility], [address] [place] [address] Phone: [phone] Fax: [phone]  [place] [address], [place] [address] Phone: [phone] Fax: [phone]  [place], [place] - [address] [place] [address] [place] [address] [place] [address] Phone: [phone] Fax: [phone]     Social Determinants of Health (SDOH) Interventions    Readmission Risk Interventions     View : No data to display.

## 2021-06-08 NOTE — Progress Notes (Signed)
1 Day Post-Op s/p left partial nephrectomy   Subjective: Complaining of migraine this morning  Endorses nausea and several bouts of small volume emesis overnight  Abdominal pain well controlled  Remains on bed rest   Objective: Vital signs in last 24 hours: Temp:  [97.5 F (36.4 C)-98.4 F (36.9 C)] 98.4 F (36.9 C) (05/24 0459) Pulse Rate:  [62-82] 62 (05/24 0459) Resp:  [11-20] 16 (05/24 0459) BP: (91-133)/(59-88) 123/81 (05/24 0459) SpO2:  [92 %-100 %] 99 % (05/24 0459)  Intake/Output from previous day: 05/23 0701 - 05/24 0700 In: 3194.2 [P.O.:240; I.V.:2454.2; IV Piggyback:500] Out: 200 [Urine:150; Blood:50] Intake/Output this shift: No intake/output data recorded.  Physical Exam:  General: Alert and oriented CV: RRR Lungs: Clear Abdomen: Soft, ND, nondistended.  Incisions: clean/ dry/ intact. No drain.  Ext: NT, No erythema  Lab Results: Recent Labs    06/07/21 1319 06/08/21 0345  HGB 12.1 11.7*  HCT 36.5 34.6*   BMET Recent Labs    06/07/21 1319 06/08/21 0345  NA 141 135  K 3.5 3.8  CL 107 104  CO2 28 25  GLUCOSE 134* 163*  BUN 7 8  CREATININE 0.75 0.86  CALCIUM 8.7* 8.4*     Studies/Results: No results found.  Assessment/Plan: 61 y/o female with small left renal mass s/p left partial nephrectomy 5/23  -Home migraine medication ordered  -Remove foley catheter with trial of void  -Off bed rest, out of bed and ambulate today  -If tolerates clears this morning, advance to regular diet  -Possible discharge this afternoon versus 5/25 pending ability to tolerate PO/ pain control with ambulation    LOS: 0 days   [place] Floor 06/08/2021, 7:20 AM

## 2021-06-09 DIAGNOSIS — C642 Malignant neoplasm of left kidney, except renal pelvis: Secondary | ICD-10-CM | POA: Diagnosis not present

## 2021-06-09 NOTE — Plan of Care (Signed)
  Problem: Education: Goal: Knowledge of General Education information will improve Description: Including pain rating scale, medication(s)/side effects and non-pharmacologic comfort measures Outcome: Adequate for Discharge   Problem: Clinical Measurements: Goal: Respiratory complications will improve Outcome: Adequate for Discharge   Problem: Pain Managment: Goal: General experience of comfort will improve Outcome: Adequate for Discharge   Problem: Safety: Goal: Ability to remain free from injury will improve Outcome: Adequate for Discharge

## 2021-06-09 NOTE — TOC Transition Note (Signed)
Transition of Care [facility]) - CM/SW Discharge Note   Patient Details  Name: Jacqueline Vance MRN: [id number] Date of Birth: 1960/06/27  Transition of Care [facility]) CM/SW Contact:  Leeroy Cha, RN Phone Number: 06/09/2021, 7:30 AM   Clinical Narrative:    Dcd to home with no toc needs   Final next level of care: Home/Self Care Barriers to Discharge: No Barriers Identified   Patient Goals and CMS Choice Patient states their goals for this hospitalization and ongoing recovery are:: toi go home CMS Medicare.gov Compare Post Acute Care list provided to:: Patient Choice offered to / list presented to : Patient  Discharge Placement                       Discharge Plan and Services   Discharge Planning Services: CM Consult                                 Social Determinants of Health (SDOH) Interventions     Readmission Risk Interventions     View : No data to display.

## 2021-06-10 ENCOUNTER — Telehealth: Payer: Self-pay | Admitting: Family Medicine

## 2021-06-10 NOTE — Telephone Encounter (Signed)
Pt called stating she had part of kidney removed recently and has a terrible migraine. She said the usual medication is not helping. Pt said she has taken hydrocodone already. Pt will try the '600mg'$  ibuprofen that has been prescribed. She requested Toradol. No open appts.   Pt requesting call back [phone]

## 2021-06-10 NOTE — Telephone Encounter (Signed)
Please review and advise.

## 2021-06-16 ENCOUNTER — Ambulatory Visit: Payer: Medicare Other | Admitting: Family Medicine

## 2021-06-16 ENCOUNTER — Encounter: Payer: Self-pay | Admitting: Family Medicine

## 2021-06-16 ENCOUNTER — Telehealth: Payer: Self-pay

## 2021-06-16 NOTE — Telephone Encounter (Signed)
Pt was taken off today's schedule

## 2021-06-16 NOTE — Telephone Encounter (Signed)
Do not charge no show fee.  

## 2021-06-16 NOTE — Progress Notes (Deleted)
OFFICE VISIT  06/16/2021  CC: No chief complaint on file.   Patient is a 61 y.o. female who presents for rash on back.  HPI: ***    Past Medical History:  Diagnosis Date   Adult ADHD    Anxiety    Aortic atherosclerosis ([place]) 08/2016   Chronic back pain    neck, too.  MRI L spine 05/2008: 40 degrees dextroscoliosis due to a hemivertebra of L3.  Some DDD/spondylosis w/out spinal stenosis.   Colon cancer screening    04/2021 Cologuard negative.   Complication of anesthesia    drop in BP, difficult to wake up, shallow breathing   Depression    Diastolic dysfunction, left ventricle 05/2018   Echo with grd I DD.   GERD (gastroesophageal reflux disease)    Gout    2 attacks total as of 12/2014.   Heart murmur    Kidney lesion, native, left 03/2021   lesion consistent with renal cell carcinoma on imaging--> urology following/reviewing treatment options with patient as of 04/2021.   Kidney stones 2005; 2017; 2018   Most recent CT 08/2016 showed 3 mm nonobstructing L nephrolithiasis.   Migraine    Proph tx with topamax helps, abortive tx with relpax helps.  Couldn't afford topamax so d/c'd this 04/2016.     Navicular fracture, foot 03/2018   Right Navicular fx-nonunion (surgery)   Pulmonary nodule 2018   5 mm LLL, stable on 2 yr f/u imaging 2022-->benign, no further imaging needed.   Restless legs syndrome    Pt can get to sleep fine so she declines med for this as of 08/2016    Past Surgical History:  Procedure Laterality Date   BREAST SURGERY  1999   reduction   ENDOMETRIAL ABLATION  2002   FOOT FRACTURE SURGERY  03/2018   Closed displaced fracture of right navicular, nonunion.   KIDNEY STONE SURGERY  2005   LAPAROSCOPIC VAGINAL HYSTERECTOMY  12/02/2007   for DUB   LUMBAR ESI  2021   no help ([place])   REDUCTION MAMMAPLASTY     ROBOTIC ASSITED PARTIAL NEPHRECTOMY Left 06/07/2021   Procedure: XI ROBOTIC ASSITED PARTIAL NEPHRECTOMY;  Surgeon: Ceasar Mons, MD;   Location: [facility];  Service: Urology;  Laterality: Left;   TRANSTHORACIC ECHOCARDIOGRAM  06/13/2018   Grd I DD   WISDOM TOOTH EXTRACTION      Outpatient Medications Prior to Visit  Medication Sig Dispense Refill   albuterol (VENTOLIN HFA) 108 (90 Base) MCG/ACT inhaler Inhale 2 puffs into the lungs every 4 (four) hours as needed for wheezing or shortness of breath. 18 g 0   ALPRAZolam (XANAX) 1 MG tablet TAKE ONE TABLET BY MOUTH TWICE A DAY AS NEEDED FOR ANXIETY 180 tablet 1   citalopram (CELEXA) 40 MG tablet Take 1 tablet (40 mg total) by mouth daily. 90 tablet 1   colchicine 0.6 MG tablet 2 tabs po at onset of gout pain, then 1 tab po one hour later (Patient taking differently: Take 0.6 mg by mouth See admin instructions. 2 tabs po at onset of gout pain, then 1 tab po one hour later as needed) 15 tablet 2   Cyanocobalamin (VITAMIN B-12 PO) Take 1 tablet by mouth daily. Reported on 04/07/2015     eletriptan (RELPAX) 40 MG tablet TAKED 1 TABLET BY MOUTH AT ONSET OF MIGRIANE HEADACHE MAY REPEAT IN 2 HOURS IF HEADACHE PERSISTS OR RECURS 18 tablet 2   fluorouracil (EFUDEX) 5 % cream Apply to areas  of sun damage on the face twice daily for 4-5 days 40 g 6   HYDROcodone-acetaminophen (NORCO/VICODIN) 5-325 MG tablet Take 1 tablet by mouth 3 (three) times daily as needed. 90 tablet 0   ibuprofen (ADVIL) 600 MG tablet TAKE ONE TABLET (600 MG TOTAL) BY MOUTH EVERY 8 HOURS AS NEEDED. 30 tablet 3   lidocaine (LIDODERM) 5 % 1 patch to the affected area q12h prn (Patient taking differently: Place 1 patch onto the skin daily as needed (pain). 1 patch to the affected area q12h prn) 90 patch 3   methocarbamol (ROBAXIN) 500 MG tablet TAKE 1 OR 2 TABLETS BY MOUTH EVERY 6 HOURS AS NEEDED FOR MUSCULAR RELAXATION 30 tablet 6   pantoprazole (PROTONIX) 40 MG tablet TAKE ONE (1) TABLET BY MOUTH EVERY DAY 90 tablet 3   tiZANidine (ZANAFLEX) 4 MG tablet 1 tab po qhs prn (Patient taking differently: Take 4 mg by mouth at  bedtime as needed for muscle spasms. 1 tab po qhs prn) 90 tablet 1   tretinoin (RETIN-A) 0.025 % cream Apply 1 application topically at bedtime as needed. For acne prevention/control 45 g 3   No facility-administered medications prior to visit.    Allergies  Allergen Reactions   Imitrex [Sumatriptan] Other (See Comments)    Face and Arm numbness    ROS As per HPI  PE:    06/09/2021    5:44 AM 06/08/2021    9:02 PM 06/08/2021   11:52 AM  Vitals with BMI  Systolic 606 301 601  Diastolic 94 85 80  Pulse 83 98 76    Physical Exam  ***  LABS:  Last metabolic panel Lab Results  Component Value Date   GLUCOSE 163 (H) 06/08/2021   NA 135 06/08/2021   K 3.8 06/08/2021   CL 104 06/08/2021   CO2 25 06/08/2021   BUN 8 06/08/2021   CREATININE 0.86 06/08/2021   GFRNONAA >60 06/08/2021   CALCIUM 8.4 (L) 06/08/2021   PROT 6.6 01/24/2021   ALBUMIN 4.3 01/24/2021   BILITOT 0.4 01/24/2021   ALKPHOS 60 01/24/2021   AST 21 01/24/2021   ALT 14 01/24/2021   ANIONGAP 6 06/08/2021      IMPRESSION AND PLAN:  No problem-specific Assessment & Plan notes found for this encounter.   An After Visit Summary was printed and given to the patient.  FOLLOW UP: No follow-ups on file.  Signed:  Crissie Sickles, MD           06/16/2021

## 2021-06-16 NOTE — Telephone Encounter (Signed)
Please advise if you would like to charge no show fee   [facility] - Client Nonclinical Telephone Record  [facility] Client [place] [facility] - Client Client Site [place] [facility] Contact Type Call Who Is Calling Patient / Member / Family / Caregiver Caller Name Louellen Haldeman Phone Number [phone] Patient Name Jacqueline Vance Patient DOB October 19, 1960 Call Type Message Only Information Provided Reason for Call Request to [facility] Appointment Initial Comment Caller states she needs to cancel her appt today her rash is gone now Patient request to speak to RN No Disp. Time Disposition Final User 06/16/2021 6:24:28 AM General Information Provided Yes Almon Register Call Closed By: Almon Register Transaction Date/Time: 06/16/2021 6:21:56 AM (ET)

## 2021-06-23 ENCOUNTER — Other Ambulatory Visit: Payer: Self-pay | Admitting: Family Medicine

## 2021-06-27 DIAGNOSIS — C642 Malignant neoplasm of left kidney, except renal pelvis: Secondary | ICD-10-CM | POA: Diagnosis not present

## 2021-06-27 DIAGNOSIS — Z85528 Personal history of other malignant neoplasm of kidney: Secondary | ICD-10-CM | POA: Diagnosis not present

## 2021-06-27 LAB — COMPREHENSIVE METABOLIC PANEL
Calcium: 9.7 (ref 8.7–10.7)
eGFR: 125.3

## 2021-06-28 LAB — BASIC METABOLIC PANEL
BUN: 6 (ref 4–21)
CO2: 27 — AB (ref 13–22)
Chloride: 101 (ref 99–108)
Creatinine: 0.3 — AB (ref 0.5–1.1)
Glucose: 82

## 2021-07-15 DIAGNOSIS — L578 Other skin changes due to chronic exposure to nonionizing radiation: Secondary | ICD-10-CM | POA: Diagnosis not present

## 2021-07-15 DIAGNOSIS — L821 Other seborrheic keratosis: Secondary | ICD-10-CM | POA: Diagnosis not present

## 2021-07-15 DIAGNOSIS — L738 Other specified follicular disorders: Secondary | ICD-10-CM | POA: Diagnosis not present

## 2021-07-15 DIAGNOSIS — L570 Actinic keratosis: Secondary | ICD-10-CM | POA: Diagnosis not present

## 2021-07-25 ENCOUNTER — Other Ambulatory Visit: Payer: Self-pay | Admitting: Family Medicine

## 2021-07-25 NOTE — Telephone Encounter (Signed)
Requesting: Norco Contract: 05/06/21 UDS: 10/06/20 Last Visit: 04/28/21 Next Visit: 07/28/21 Last Refill: 06/23/21(90,0)  Please Advise. Med pending

## 2021-07-27 NOTE — Telephone Encounter (Signed)
LM for pt regarding medication ?

## 2021-08-24 ENCOUNTER — Other Ambulatory Visit: Payer: Self-pay | Admitting: Family Medicine

## 2021-08-25 ENCOUNTER — Other Ambulatory Visit: Payer: Self-pay | Admitting: Family Medicine

## 2021-08-25 NOTE — Telephone Encounter (Signed)
Refill sent to provider for approval.

## 2021-08-25 NOTE — Telephone Encounter (Signed)
Requesting:  Norco Contract: 05/06/21 UDS: 10/06/20 Last Visit: 04/28/21 Next Visit: 3 month f/u advised Last Refill: 07/25/21(90,0)  Please Advise. Med pending

## 2021-09-09 ENCOUNTER — Other Ambulatory Visit: Payer: Self-pay | Admitting: Family Medicine

## 2021-09-09 DIAGNOSIS — H5213 Myopia, bilateral: Secondary | ICD-10-CM | POA: Diagnosis not present

## 2021-09-09 DIAGNOSIS — H2513 Age-related nuclear cataract, bilateral: Secondary | ICD-10-CM | POA: Diagnosis not present

## 2021-09-09 DIAGNOSIS — H52223 Regular astigmatism, bilateral: Secondary | ICD-10-CM | POA: Diagnosis not present

## 2021-09-09 DIAGNOSIS — H16143 Punctate keratitis, bilateral: Secondary | ICD-10-CM | POA: Diagnosis not present

## 2021-09-26 ENCOUNTER — Other Ambulatory Visit: Payer: Self-pay | Admitting: Family Medicine

## 2021-09-26 NOTE — Telephone Encounter (Signed)
Requesting: alprazolam Contract: 05/06/21 UDS: 10/06/20 Last Visit: 04/28/21 Next Visit: 3 mo f/u not completed Last Refill: 03/23/21(180,1)  RF request for citalopram  LOV: 04/28/21 Next ov: 3 mo f/u not completed Last written: 04/28/21 (90,1) not due for refill  Requesting: Norco Contract: 05/06/21 UDS: 10/06/20  Last Visit: 04/28/21 Next Visit: 3 mo f/u not completed Last Refill: 08/25/21(90,0)  Please Advise. Meds pending

## 2021-09-26 NOTE — Telephone Encounter (Signed)
#  10 Tabs of alprazolam and #10 tabs of Vicodin sent in.  Patient overdue for appropriate follow-up.

## 2021-09-27 ENCOUNTER — Encounter: Payer: Self-pay | Admitting: Family Medicine

## 2021-09-27 NOTE — Telephone Encounter (Signed)
Pt scheduled for 09/13

## 2021-09-27 NOTE — Telephone Encounter (Signed)
Pt sched for appt on 09/28/21

## 2021-09-28 ENCOUNTER — Ambulatory Visit: Payer: Medicare Other | Admitting: Family Medicine

## 2021-09-28 NOTE — Progress Notes (Deleted)
OFFICE VISIT  09/28/2021  CC: No chief complaint on file.   HPI:    Patient is a 61 y.o. female who presents for  A/P as of last visit: "#1 chronic pain syndrome--multifactorial. Primarily mid back due to her scoliosis.  She does have some degenerative joint disease in her spine. Her pain is well controlled with use of Vicodin 5/325, 1 tab 3 times daily.  Prescription renewed today, #90. UDS utd 09/2020, appropriate results. CSC UTD No labs needed   #2 recurrent major depressive disorder, chronic anxiety. All stable.  Continue citalopram 40 mg a day and alprazolam 1 mg twice daily as needed.   #3 kidney lesion. This was initially detected on CT renal stone study 03/2020. Reimaging last month by urology showed change in this per patient report. We will obtain Dr. Purvis Sheffield office visit note and the CT report done in his office."  INTERIM HX: ***   She got left side partial nephrectomy 06/07/2021, path showed clear cell carcinoma.   PMP AWARE reviewed today: most recent rx for Vicodin and alprazolam were filled 09/26/2021, #10 each, rx by me. No red flags.   Past Medical History:  Diagnosis Date   Adult ADHD    Anxiety    Aortic atherosclerosis ([place]) 08/2016   Chronic back pain    neck, too.  MRI L spine 05/2008: 40 degrees dextroscoliosis due to a hemivertebra of L3.  Some DDD/spondylosis w/out spinal stenosis.   Colon cancer screening    04/2021 Cologuard negative.   Complication of anesthesia    drop in BP, difficult to wake up, shallow breathing   Depression    Diastolic dysfunction, left ventricle 05/2018   Echo with grd I DD.   GERD (gastroesophageal reflux disease)    Gout    2 attacks total as of 12/2014.   Heart murmur    Kidney lesion, native, left 03/2021   lesion consistent with renal cell carcinoma on imaging--> left partial nephrectomy 05/2021   Kidney stones 2005; 2017; 2018   Most recent CT 08/2016 showed 3 mm nonobstructing L nephrolithiasis.   Migraine     Proph tx with topamax helps, abortive tx with relpax helps.  Couldn't afford topamax so d/c'd this 04/2016.     Navicular fracture, foot 03/2018   Right Navicular fx-nonunion (surgery)   Pulmonary nodule 2018   5 mm LLL, stable on 2 yr f/u imaging 2022-->benign, no further imaging needed.   Restless legs syndrome    Pt can get to sleep fine so she declines med for this as of 08/2016    Past Surgical History:  Procedure Laterality Date   BREAST SURGERY  1999   reduction   ENDOMETRIAL ABLATION  2002   FOOT FRACTURE SURGERY  03/2018   Closed displaced fracture of right navicular, nonunion.   KIDNEY STONE SURGERY  2005   LAPAROSCOPIC VAGINAL HYSTERECTOMY  12/02/2007   for DUB   LUMBAR ESI  2021   no help ([place])   REDUCTION MAMMAPLASTY     ROBOTIC ASSITED PARTIAL NEPHRECTOMY Left 06/07/2021   Procedure: XI ROBOTIC ASSITED PARTIAL NEPHRECTOMY;  Surgeon: Ceasar Mons, MD;  Location: [facility];  Service: Urology;  Laterality: Left;   TRANSTHORACIC ECHOCARDIOGRAM  06/13/2018   Grd I DD   WISDOM TOOTH EXTRACTION      Outpatient Medications Prior to Visit  Medication Sig Dispense Refill   albuterol (VENTOLIN HFA) 108 (90 Base) MCG/ACT inhaler Inhale 2 puffs into the lungs every 4 (four) hours  as needed for wheezing or shortness of breath. 18 g 0   ALPRAZolam (XANAX) 1 MG tablet TAKE ONE TABLET BY MOUTH TWICE A DAY AS NEEDED FOR ANXIETY 10 tablet 0   citalopram (CELEXA) 40 MG tablet Take 1 tablet (40 mg total) by mouth daily. 90 tablet 1   colchicine 0.6 MG tablet 2 tabs po at onset of gout pain, then 1 tab po one hour later (Patient taking differently: Take 0.6 mg by mouth See admin instructions. 2 tabs po at onset of gout pain, then 1 tab po one hour later as needed) 15 tablet 2   Cyanocobalamin (VITAMIN B-12 PO) Take 1 tablet by mouth daily. Reported on 04/07/2015     eletriptan (RELPAX) 40 MG tablet TAKE 1 TABLET BY MOUTH AT ONSET OF MIGRAINE HEADACHE - MAY REPEAT IN 2 HOURS IF  HEADACHE PERSISTS OR RECURS 18 tablet 0   fluorouracil (EFUDEX) 5 % cream Apply to areas of sun damage on the face twice daily for 4-5 days 40 g 6   HYDROcodone-acetaminophen (NORCO/VICODIN) 5-325 MG tablet TAKE ONE TABLET BY MOUTH THREE TIMES DAILY AS NEEDED 10 tablet 0   ibuprofen (ADVIL) 600 MG tablet TAKE ONE TABLET (600 MG TOTAL) BY MOUTH EVERY 8 HOURS AS NEEDED. 30 tablet 3   lidocaine (LIDODERM) 5 % 1 patch to the affected area q12h prn (Patient taking differently: Place 1 patch onto the skin daily as needed (pain). 1 patch to the affected area q12h prn) 90 patch 3   methocarbamol (ROBAXIN) 500 MG tablet TAKE 1 OR 2 TABLETS BY MOUTH EVERY 6 HOURS AS NEEDED FOR MUSCULAR RELAXATION 30 tablet 6   pantoprazole (PROTONIX) 40 MG tablet TAKE ONE (1) TABLET BY MOUTH EVERY DAY 90 tablet 3   tiZANidine (ZANAFLEX) 4 MG tablet 1 tab po qhs prn (Patient taking differently: Take 4 mg by mouth at bedtime as needed for muscle spasms. 1 tab po qhs prn) 90 tablet 1   tretinoin (RETIN-A) 0.025 % cream Apply 1 application topically at bedtime as needed. For acne prevention/control 45 g 3   No facility-administered medications prior to visit.    Allergies  Allergen Reactions   Imitrex [Sumatriptan] Other (See Comments)    Face and Arm numbness    ROS As per HPI  PE:    06/09/2021    5:44 AM 06/08/2021    9:02 PM 06/08/2021   11:52 AM  Vitals with BMI  Systolic 585 929 244  Diastolic 94 85 80  Pulse 83 98 76     Physical Exam  ***  LABS:  Last CBC Lab Results  Component Value Date   WBC 11.6 (H) 06/08/2021   HGB 11.7 (L) 06/08/2021   HCT 34.6 (L) 06/08/2021   MCV 90.6 06/08/2021   MCH 30.6 06/08/2021   RDW 12.3 06/08/2021   PLT 148 (L) 62/86/3817   Last metabolic panel Lab Results  Component Value Date   GLUCOSE 163 (H) 06/08/2021   NA 135 06/08/2021   K 3.8 06/08/2021   CL 101 06/27/2021   CO2 27 (A) 06/27/2021   BUN 6 06/27/2021   CREATININE 0.3 (A) 06/27/2021   EGFR  125.3 06/27/2021   CALCIUM 9.7 06/27/2021   PROT 6.6 01/24/2021   ALBUMIN 4.3 01/24/2021   BILITOT 0.4 01/24/2021   ALKPHOS 60 01/24/2021   AST 21 01/24/2021   ALT 14 01/24/2021   ANIONGAP 6 06/08/2021    IMPRESSION AND PLAN:  No problem-specific Assessment & Plan notes  found for this encounter.   An After Visit Summary was printed and given to the patient.  FOLLOW UP: No follow-ups on file.  Signed:  Crissie Sickles, MD           09/28/2021

## 2021-09-28 NOTE — Telephone Encounter (Signed)
Staff made aware of cancellation

## 2021-09-30 ENCOUNTER — Encounter: Payer: Self-pay | Admitting: Family Medicine

## 2021-09-30 ENCOUNTER — Ambulatory Visit: Payer: Medicare Other | Admitting: Family Medicine

## 2021-09-30 ENCOUNTER — Ambulatory Visit (INDEPENDENT_AMBULATORY_CARE_PROVIDER_SITE_OTHER): Payer: Medicare Other | Admitting: Family Medicine

## 2021-09-30 VITALS — BP 100/60 | HR 62 | Wt 151.0 lb

## 2021-09-30 DIAGNOSIS — G894 Chronic pain syndrome: Secondary | ICD-10-CM

## 2021-09-30 DIAGNOSIS — Z23 Encounter for immunization: Secondary | ICD-10-CM

## 2021-09-30 DIAGNOSIS — F320 Major depressive disorder, single episode, mild: Secondary | ICD-10-CM

## 2021-09-30 DIAGNOSIS — M4125 Other idiopathic scoliosis, thoracolumbar region: Secondary | ICD-10-CM

## 2021-09-30 DIAGNOSIS — Z79899 Other long term (current) drug therapy: Secondary | ICD-10-CM | POA: Diagnosis not present

## 2021-09-30 DIAGNOSIS — F411 Generalized anxiety disorder: Secondary | ICD-10-CM | POA: Diagnosis not present

## 2021-09-30 DIAGNOSIS — G8929 Other chronic pain: Secondary | ICD-10-CM

## 2021-09-30 DIAGNOSIS — M5442 Lumbago with sciatica, left side: Secondary | ICD-10-CM

## 2021-09-30 DIAGNOSIS — M7918 Myalgia, other site: Secondary | ICD-10-CM

## 2021-09-30 MED ORDER — ALPRAZOLAM 1 MG PO TABS: ORAL_TABLET | ORAL | 1 refills | Status: DC

## 2021-09-30 MED ORDER — HYDROCODONE-ACETAMINOPHEN 5-325 MG PO TABS: 1.0000 | ORAL_TABLET | Freq: Three times a day (TID) | ORAL | 0 refills | Status: DC | PRN

## 2021-09-30 MED ORDER — METHOCARBAMOL 500 MG PO TABS: ORAL_TABLET | ORAL | 6 refills | Status: DC

## 2021-09-30 NOTE — Progress Notes (Signed)
CC:  61 y/o female being seen today for f/u chronic pain and anxiety/dep w/high risk med use. A/P as of last visit: "#1 chronic pain syndrome--multifactorial. Primarily mid back due to her scoliosis.  She does have some degenerative joint disease in her spine. Her pain is well controlled with use of Vicodin 5/325, 1 tab 3 times daily.  Prescription renewed today, #90. UDS utd 09/2020, appropriate results. [facility] UTD No labs needed   #2 recurrent major depressive disorder, chronic anxiety. All stable.  Continue citalopram 40 mg a day and alprazolam 1 mg twice daily as needed.   #3 kidney lesion. This was initially detected on CT renal stone study 03/2020. Reimaging last month by urology showed change in this per patient report. We will obtain Dr. Purvis Sheffield office visit note and the CT report done in his office."  INTERIM HX:  She got partial left nephrectomy on 06/07/2021 for renal cell cancer. She feels like this is a great weight lifted off her shoulder.  She has urology follow-up later this month.  Her chronic back pain had been stable until she went to a wedding last week and did some dancing.  The next morning she had severe left low back pain with radiation down the left leg.  This continued and she was unable to really get up out of bed for a few days.  It has started getting significantly better today. Indication for chronic opioid: neck, mid back, low back, HA's, myofascial pain, arthralgias of appendicular skeleton.  Some days finds it hard to get out of bed and function.   She carries dx of DDD of C and L spine, significant scoliosis, myofascial pain syndrome with trigger points and recurrent trochanteric bursitis.  Has seen a handful of specialists in the past (spine and scoliosis center in [place], [facility], neurologist, in [place], also [facility] in [place]).  She has never been managed by a pain mgmt specialist.   Medication and dose: vicodin 5/325, 1 tid prn, #90 per mo. PMP  AWARE reviewed today: most recent rx for Vicodin and alprazolam were filled 09/26/2021, #10 each, rx by me. No red flags.  She says her depression has been pretty bad this summer.  Mainly situational, though.  She says her brother has been jobless and was living with them up until earlier this month.  Her 28 year old son also lives with her and has been on and off drugs.  Her son did not get along with her brother at all.  Last couple of weeks her mood has improved significantly. Continues to take citalopram 40 mg a day and alprazolam 1 mg twice daily.  ROS as above, plus--> no fevers, no CP, no SOB, no wheezing, no cough, no dizziness, no HAs, no rashes, no melena/hematochezia.  No polyuria or polydipsia.    No focal weakness, paresthesias, or tremors.  No acute vision or hearing abnormalities.  No dysuria or unusual/new urinary urgency or frequency.  No recent changes in lower legs. No n/v/d or abd pain.  No palpitations.     Past Medical History:  Diagnosis Date   Adult ADHD    Anxiety    Aortic atherosclerosis ([place]) 08/2016   Chronic back pain    neck, too.  MRI L spine 05/2008: 40 degrees dextroscoliosis due to a hemivertebra of L3.  Some DDD/spondylosis w/out spinal stenosis.   Colon cancer screening    04/2021 Cologuard negative.   Complication of anesthesia    drop in BP,  difficult to wake up, shallow breathing   Depression    Diastolic dysfunction, left ventricle 05/2018   Echo with grd I DD.   GERD (gastroesophageal reflux disease)    Gout    2 attacks total as of 12/2014.   Heart murmur    Kidney lesion, native, left 03/2021   lesion consistent with renal cell carcinoma on imaging--> left partial nephrectomy 05/2021   Kidney stones 2005; 2017; 2018   Most recent CT 08/2016 showed 3 mm nonobstructing L nephrolithiasis.   Migraine    Proph tx with topamax helps, abortive tx with relpax helps.  Couldn't afford topamax so d/c'd this 04/2016.     Navicular fracture, foot 03/2018    Right Navicular fx-nonunion (surgery)   Pulmonary nodule 2018   5 mm LLL, stable on 2 yr f/u imaging 2022-->benign, no further imaging needed.   Restless legs syndrome    Pt can get to sleep fine so she declines med for this as of 08/2016    Past Surgical History:  Procedure Laterality Date   BREAST SURGERY  1999   reduction   ENDOMETRIAL ABLATION  2002   FOOT FRACTURE SURGERY  03/2018   Closed displaced fracture of right navicular, nonunion.   KIDNEY STONE SURGERY  2005   LAPAROSCOPIC VAGINAL HYSTERECTOMY  12/02/2007   for DUB   LUMBAR ESI  2021   no help ([place])   REDUCTION MAMMAPLASTY     ROBOTIC ASSITED PARTIAL NEPHRECTOMY Left 06/07/2021   Procedure: XI ROBOTIC ASSITED PARTIAL NEPHRECTOMY;  Surgeon: Ceasar Mons, MD;  Location: [facility];  Service: Urology;  Laterality: Left;   TRANSTHORACIC ECHOCARDIOGRAM  06/13/2018   Grd I DD   WISDOM TOOTH EXTRACTION       Current Outpatient Medications:    ALPRAZolam (XANAX) 1 MG tablet, TAKE ONE TABLET BY MOUTH TWICE A DAY AS NEEDED FOR ANXIETY, Disp: 10 tablet, Rfl: 0   citalopram (CELEXA) 40 MG tablet, Take 1 tablet (40 mg total) by mouth daily., Disp: 90 tablet, Rfl: 1   Cyanocobalamin (VITAMIN B-12 PO), Take 1 tablet by mouth daily. Reported on 04/07/2015, Disp: , Rfl:    eletriptan (RELPAX) 40 MG tablet, TAKE 1 TABLET BY MOUTH AT ONSET OF MIGRAINE HEADACHE - MAY REPEAT IN 2 HOURS IF HEADACHE PERSISTS OR RECURS, Disp: 18 tablet, Rfl: 0   fluorometholone (FML) 0.1 % ophthalmic suspension, SMARTSIG:In Eye(s), Disp: , Rfl:    HYDROcodone-acetaminophen (NORCO/VICODIN) 5-325 MG tablet, TAKE ONE TABLET BY MOUTH THREE TIMES DAILY AS NEEDED, Disp: 10 tablet, Rfl: 0   ibuprofen (ADVIL) 600 MG tablet, TAKE ONE TABLET (600 MG TOTAL) BY MOUTH EVERY 8 HOURS AS NEEDED., Disp: 30 tablet, Rfl: 3   lidocaine (LIDODERM) 5 %, 1 patch to the affected area q12h prn (Patient taking differently: Place 1 patch onto the skin daily as needed (pain). 1  patch to the affected area q12h prn), Disp: 90 patch, Rfl: 3   methocarbamol (ROBAXIN) 500 MG tablet, TAKE 1 OR 2 TABLETS BY MOUTH EVERY 6 HOURS AS NEEDED FOR MUSCULAR RELAXATION, Disp: 30 tablet, Rfl: 6   pantoprazole (PROTONIX) 40 MG tablet, TAKE ONE (1) TABLET BY MOUTH EVERY DAY, Disp: 90 tablet, Rfl: 3   tiZANidine (ZANAFLEX) 4 MG tablet, 1 tab po qhs prn (Patient taking differently: Take 4 mg by mouth at bedtime as needed for muscle spasms. 1 tab po qhs prn), Disp: 90 tablet, Rfl: 1   tretinoin (RETIN-A) 0.025 % cream, Apply 1 application topically at  bedtime as needed. For acne prevention/control, Disp: 45 g, Rfl: 3   albuterol (VENTOLIN HFA) 108 (90 Base) MCG/ACT inhaler, Inhale 2 puffs into the lungs every 4 (four) hours as needed for wheezing or shortness of breath. (Patient not taking: Reported on 09/30/2021), Disp: 18 g, Rfl: 0   colchicine 0.6 MG tablet, 2 tabs po at onset of gout pain, then 1 tab po one hour later (Patient not taking: Reported on 09/30/2021), Disp: 15 tablet, Rfl: 2   fluorouracil (EFUDEX) 5 % cream, Apply to areas of sun damage on the face twice daily for 4-5 days (Patient not taking: Reported on 09/30/2021), Disp: 40 g, Rfl: 6  EXAM:  VITALS per patient if applicable:     05/09/5359    5:44 AM 06/08/2021    9:02 PM 06/08/2021   11:52 AM  Vitals with BMI  Systolic 443 154 008  Diastolic 94 85 80  Pulse 83 98 76   Gen: Alert, well appearing.  Patient is oriented to person, place, time, and situation. AFFECT: pleasant, lucid thought and speech. CV: RRR, no m/r/g.   LUNGS: CTA bilat, nonlabored resps, good aeration in all lung fields. EXT: no clubbing or cyanosis.  no edema.    LABS: none today   Chemistry      Component Value Date/Time   NA 135 06/08/2021 0345   K 3.8 06/08/2021 0345   CL 101 06/27/2021 0000   CO2 27 (A) 06/27/2021 0000   BUN 6 06/27/2021 0000   CREATININE 0.3 (A) 06/27/2021 0000   CREATININE 0.86 06/08/2021 0345   GLU 82 06/27/2021 0000       Component Value Date/Time   CALCIUM 9.7 06/27/2021 0000   ALKPHOS 60 01/24/2021 1211   AST 21 01/24/2021 1211   ALT 14 01/24/2021 1211   BILITOT 0.4 01/24/2021 1211     ASSESSMENT AND PLAN:  #1 chronic pain syndrome--scoliosis and multilevel spondylosis.  Myofascial pain syndrome. Recent flare/worsening after overactivity, resolving. We will continue with Vicodin 5-3 25, 1 3 times daily as needed, number 90/month.  New prescription sent today. Urine drug screen today.  2.  Recurrent major depressive disorder, GAD. Overall stable at this time.  She does go through ups and downs that are pretty big and are almost always related to family situations. Encouragement given. Continue citalopram 40 mg a day and alprazolam 1 mg twice daily. New prescriptions today.  Urine drug screen today.  I discussed the assessment and treatment plan with the patient. The patient was provided an opportunity to ask questions and all were answered. The patient agreed with the plan and demonstrated an understanding of the instructions.   F/u: 3 mo f/u pain  Signed:  Crissie Sickles, MD           09/30/2021

## 2021-10-02 LAB — DRUG MONITORING PANEL 376104, URINE
Alphahydroxyalprazolam: 376 ng/mL — ABNORMAL HIGH (ref <25)
Alphahydroxymidazolam: NEGATIVE ng/mL (ref <50)
Alphahydroxytriazolam: NEGATIVE ng/mL (ref <50)
Aminoclonazepam: NEGATIVE ng/mL (ref <25)
Amphetamines: NEGATIVE ng/mL (ref <500)
Barbiturates: NEGATIVE ng/mL (ref <300)
Benzodiazepines: POSITIVE ng/mL — AB (ref <100)
Cocaine Metabolite: NEGATIVE ng/mL (ref <150)
Codeine: NEGATIVE ng/mL (ref <50)
Desmethyltramadol: NEGATIVE ng/mL (ref <100)
Hydrocodone: 2319 ng/mL — ABNORMAL HIGH (ref <50)
Hydromorphone: 312 ng/mL — ABNORMAL HIGH (ref <50)
Hydroxyethylflurazepam: NEGATIVE ng/mL (ref <50)
Lorazepam: NEGATIVE ng/mL (ref <50)
Morphine: NEGATIVE ng/mL (ref <50)
Nordiazepam: NEGATIVE ng/mL (ref <50)
Norhydrocodone: 3828 ng/mL — ABNORMAL HIGH (ref <50)
Noroxycodone: 1522 ng/mL — ABNORMAL HIGH (ref <50)
Opiates: POSITIVE ng/mL — AB (ref <100)
Oxazepam: NEGATIVE ng/mL (ref <50)
Oxycodone: 277 ng/mL — ABNORMAL HIGH (ref <50)
Oxycodone: POSITIVE ng/mL — AB (ref <100)
Oxymorphone: 365 ng/mL — ABNORMAL HIGH (ref <50)
Temazepam: NEGATIVE ng/mL (ref <50)
Tramadol: NEGATIVE ng/mL (ref <100)

## 2021-10-02 LAB — DM TEMPLATE

## 2021-10-18 ENCOUNTER — Other Ambulatory Visit: Payer: Self-pay | Admitting: Family Medicine

## 2021-10-20 ENCOUNTER — Ambulatory Visit (HOSPITAL_COMMUNITY)
Admission: RE | Admit: 2021-10-20 | Discharge: 2021-10-20 | Disposition: A | Discharge status: Home / Self Care | Payer: Medicare Other | Source: Ambulatory Visit | Attending: Urology | Admitting: Urology

## 2021-10-20 ENCOUNTER — Other Ambulatory Visit (HOSPITAL_COMMUNITY): Payer: Self-pay | Admitting: Urology

## 2021-10-20 DIAGNOSIS — C649 Malignant neoplasm of unspecified kidney, except renal pelvis: Secondary | ICD-10-CM | POA: Diagnosis not present

## 2021-10-20 DIAGNOSIS — R911 Solitary pulmonary nodule: Secondary | ICD-10-CM | POA: Diagnosis not present

## 2021-10-20 DIAGNOSIS — Z85528 Personal history of other malignant neoplasm of kidney: Secondary | ICD-10-CM | POA: Insufficient documentation

## 2021-10-20 DIAGNOSIS — D49512 Neoplasm of unspecified behavior of left kidney: Secondary | ICD-10-CM | POA: Diagnosis not present

## 2021-10-20 DIAGNOSIS — K8689 Other specified diseases of pancreas: Secondary | ICD-10-CM | POA: Diagnosis not present

## 2021-10-27 DIAGNOSIS — C642 Malignant neoplasm of left kidney, except renal pelvis: Secondary | ICD-10-CM | POA: Diagnosis not present

## 2021-10-31 ENCOUNTER — Other Ambulatory Visit: Payer: Self-pay | Admitting: Family Medicine

## 2021-10-31 NOTE — Telephone Encounter (Signed)
Requesting: Hydrocodone 5-325 Contract: 04/27/21 UDS: 09/30/21 Last Visit: 09/30/21 Next Visit: 12/30/21 Last Refill: 09/30/21 (90,0)   RF request for Ibuprofen LOV: 09/30/21 Next ov: 12/30/21 Last written: 04/28/21 (30,3)  Please Advise. Meds pending

## 2021-11-01 NOTE — Telephone Encounter (Signed)
LM for pt regarding medication ?

## 2021-11-02 ENCOUNTER — Encounter: Payer: Self-pay | Admitting: Family Medicine

## 2021-11-04 NOTE — Telephone Encounter (Signed)
Pt advised refill sent. She has already picked up both prescriptions.

## 2021-11-10 ENCOUNTER — Other Ambulatory Visit: Payer: Self-pay | Admitting: Family Medicine

## 2021-12-01 ENCOUNTER — Other Ambulatory Visit: Payer: Self-pay | Admitting: Family Medicine

## 2021-12-01 NOTE — Telephone Encounter (Signed)
Requesting: Norco Contract: 05/06/21 UDS: 09/30/21 Last Visit: 09/30/21 Next Visit: 12/30/21 Last Refill: 10/31/21(90,0)  Please Advise. Med pending

## 2021-12-12 ENCOUNTER — Other Ambulatory Visit: Payer: Self-pay | Admitting: Family Medicine

## 2021-12-30 ENCOUNTER — Ambulatory Visit: Payer: Medicare Other | Admitting: Family Medicine

## 2021-12-31 ENCOUNTER — Other Ambulatory Visit: Payer: Self-pay | Admitting: Family Medicine

## 2022-01-02 ENCOUNTER — Ambulatory Visit (INDEPENDENT_AMBULATORY_CARE_PROVIDER_SITE_OTHER): Payer: Medicare Other | Admitting: Family Medicine

## 2022-01-02 ENCOUNTER — Encounter: Payer: Self-pay | Admitting: Family Medicine

## 2022-01-02 VITALS — BP 112/79 | HR 98 | Temp 97.8°F | Ht 63.0 in | Wt 148.0 lb

## 2022-01-02 DIAGNOSIS — M545 Low back pain, unspecified: Secondary | ICD-10-CM

## 2022-01-02 DIAGNOSIS — F411 Generalized anxiety disorder: Secondary | ICD-10-CM

## 2022-01-02 DIAGNOSIS — G894 Chronic pain syndrome: Secondary | ICD-10-CM

## 2022-01-02 DIAGNOSIS — F3341 Major depressive disorder, recurrent, in partial remission: Secondary | ICD-10-CM | POA: Diagnosis not present

## 2022-01-02 DIAGNOSIS — R42 Dizziness and giddiness: Secondary | ICD-10-CM

## 2022-01-02 DIAGNOSIS — G8929 Other chronic pain: Secondary | ICD-10-CM

## 2022-01-02 DIAGNOSIS — Z79899 Other long term (current) drug therapy: Secondary | ICD-10-CM

## 2022-01-02 LAB — BASIC METABOLIC PANEL
BUN: 9 mg/dL (ref 6–23)
CO2: 30 mEq/L (ref 19–32)
Calcium: 10 mg/dL (ref 8.4–10.5)
Chloride: 105 mEq/L (ref 96–112)
Creatinine, Ser: 0.91 mg/dL (ref 0.40–1.20)
GFR: 69.05 mL/min (ref >60.00)
Glucose, Bld: 88 mg/dL (ref 70–99)
Potassium: 4.9 mEq/L (ref 3.5–5.1)
Sodium: 142 mEq/L (ref 135–145)

## 2022-01-02 MED ORDER — HYDROCODONE-ACETAMINOPHEN 5-325 MG PO TABS: 1.0000 | ORAL_TABLET | Freq: Three times a day (TID) | ORAL | 0 refills | Status: DC | PRN

## 2022-01-02 MED ORDER — METHOCARBAMOL 500 MG PO TABS: ORAL_TABLET | ORAL | 6 refills | Status: DC

## 2022-01-02 MED ORDER — CITALOPRAM HYDROBROMIDE 40 MG PO TABS: 40.0000 mg | ORAL_TABLET | Freq: Every day | ORAL | 1 refills | Status: DC

## 2022-01-02 MED ORDER — IBUPROFEN 600 MG PO TABS: ORAL_TABLET | ORAL | 3 refills | Status: DC

## 2022-01-02 NOTE — Telephone Encounter (Signed)
This was sent in this morning

## 2022-01-02 NOTE — Progress Notes (Signed)
OFFICE VISIT  01/02/2022  CC:  Chief Complaint  Patient presents with   Follow-up    3 month follow up chronic pain , having  trouble moving around since sx in may .    Patient is a 61 y.o. female who presents for 62-monthfollow-up chronic pain syndrome, GAD, and recurrent major depression disorder. A/P as of last visit: "#1 chronic pain syndrome--scoliosis and multilevel spondylosis.  Myofascial pain syndrome. Recent flare/worsening after overactivity, resolving. We will continue with Vicodin 5-3 25, 1 3 times daily as needed, number 90/month.  New prescription sent today. Urine drug screen today.   2.  Recurrent major depressive disorder, GAD. Overall stable at this time.  She does go through ups and downs that are pretty big and are almost always related to family situations. Encouragement given. Continue citalopram 40 mg a day and alprazolam 1 mg twice daily. New prescriptions today.  Urine drug screen today."  INTERIM HX: Significant increase in stress and anxiety since I saw her last. Son with mental health issues and drug abuse issues.  She has acute anxiety reaction sometimes just when she hears his voice. She notes intermittent periods of mild lightheadedness and mental fogginess along with some nausea. Often these come when she has not eaten in a while.  She admits eating too many carbs, then often goes too long between eating.  Increased pain in normal lately, associates this with having to move a family member into their home without help. Indication for chronic opioid: neck, mid back, low back, HA's, myofascial pain, arthralgias of appendicular skeleton.  Some days finds it hard to get out of bed and function.   She carries dx of DDD of C and L spine, significant scoliosis, myofascial pain syndrome with trigger points and recurrent trochanteric bursitis.  Has seen a handful of specialists in the past ([facility] [place] [place], [place]  [facility] [place]  She has never been managed by a pain mgmt specialist.   Medication and dose: vicodin 5/325, 1 tid prn, #90 per mo. PMP AWARE reviewed today: most recent rx for Vicodin was filled 12/01/2021, # 921 rx by me. Most recent prescription for alprazolam was filled 10/01/2021, #180, prescription by me. No red flags.  ROS as above, plus--> no fevers, no CP, no SOB, no wheezing, no cough, no HAs, no rashes, no melena/hematochezia.  No polyuria or polydipsia.  No myalgias or arthralgias.  No focal weakness, paresthesias, or tremors.  No acute vision or hearing abnormalities.  No dysuria or unusual/new urinary urgency or frequency.  No recent changes in lower legs. No vomiting, diarrhea, or abd pain.  No palpitations.    Past Medical History:  Diagnosis Date   Adult ADHD    Anxiety    [place]   Chronic back pain    neck, too.  MRI L spine 05/2008: 40 degrees dextroscoliosis due to a hemivertebra of L3.  Some DDD/spondylosis w/out spinal stenosis.   Colon cancer screening    04/2021 Cologuard negative.   Complication of anesthesia    drop in BP, difficult to wake up, shallow breathing   Depression    Diastolic dysfunction, left ventricle 05/2018   Echo with grd I DD.   GERD (gastroesophageal reflux disease)    Gout    2 attacks total as of 12/2014.   Heart murmur    Kidney lesion, native, left 03/2021   lesion consistent with renal  cell carcinoma on imaging--> left partial nephrectomy 05/2021   Kidney stones 2005; 2017; 2018   Most recent CT 08/2016 showed 3 mm nonobstructing L nephrolithiasis.   Migraine    Proph tx with topamax helps, abortive tx with relpax helps.  Couldn't afford topamax so d/c'd this 04/2016.     Navicular fracture, foot 03/2018   Right Navicular fx-nonunion (surgery)   Pancreatic lesion    10/2021 62m cystic lesion, needs f/u MR 1 yr   Pulmonary nodule 2018   5 mm LLL, stable on 2 yr f/u imaging 2022-->benign, no  further imaging needed.   [place]    partial nephrectomy 06/07/21   Restless legs syndrome    Pt can get to sleep fine so she declines med for this as of 08/2016    Past Surgical History:  Procedure Laterality Date   BREAST SURGERY  1999   reduction   ENDOMETRIAL ABLATION  2002   FOOT FRACTURE SURGERY  03/2018   Closed displaced fracture of right navicular, nonunion.   KIDNEY STONE SURGERY  2005   LAPAROSCOPIC VAGINAL HYSTERECTOMY  12/02/2007   for DUB   LUMBAR ESI  2021   no help ([place])   REDUCTION MAMMAPLASTY     ROBOTIC ASSITED PARTIAL NEPHRECTOMY Left 06/07/2021   Procedure: XI ROBOTIC ASSITED PARTIAL NEPHRECTOMY;  Surgeon: WCeasar Mons MD;  Location: [facility];  Service: Urology;  Laterality: Left;   TRANSTHORACIC ECHOCARDIOGRAM  06/13/2018   Grd I DD   WISDOM TOOTH EXTRACTION      Outpatient Medications Prior to Visit  Medication Sig Dispense Refill   albuterol (VENTOLIN HFA) 108 (90 Base) MCG/ACT inhaler Inhale 2 puffs into the lungs every 4 (four) hours as needed for wheezing or shortness of breath. 18 g 0   ALPRAZolam (XANAX) 1 MG tablet 1 tab po bid prn anxiety 180 tablet 1   colchicine 0.6 MG tablet 2 tabs po at onset of gout pain, then 1 tab po one hour later 15 tablet 2   Cyanocobalamin (VITAMIN B-12 PO) Take 1 tablet by mouth daily. Reported on 04/07/2015     eletriptan (RELPAX) 40 MG tablet TAKE 1 TABLET BY MOUTH AT ONSET OF MIGRAINE HEADACHE -  MAY  REPEAT  IN  2  HOURS  IF  HEADACHE  PERSISTS  OR  RECURS 18 tablet 0   fluorometholone (FML) 0.1 % ophthalmic suspension SMARTSIG:In Eye(s)     fluorouracil (EFUDEX) 5 % cream Apply to areas of sun damage on the face twice daily for 4-5 days 40 g 6   lidocaine (LIDODERM) 5 % 1 patch to the affected area q12h prn (Patient taking differently: Place 1 patch onto the skin daily as needed (pain). 1 patch to the affected area q12h prn) 90 patch 3   pantoprazole (PROTONIX) 40 MG tablet TAKE ONE (1)  TABLET BY MOUTH EVERY DAY 90 tablet 3   tiZANidine (ZANAFLEX) 4 MG tablet 1 tab po qhs prn (Patient taking differently: Take 4 mg by mouth at bedtime as needed for muscle spasms. 1 tab po qhs prn) 90 tablet 1   tretinoin (RETIN-A) 0.025 % cream Apply 1 application topically at bedtime as needed. For acne prevention/control 45 g 3   citalopram (CELEXA) 40 MG tablet Take 1 tablet (40 mg total) by mouth daily. 90 tablet 1   HYDROcodone-acetaminophen (NORCO/VICODIN) 5-325 MG tablet TAKE ONE TABLET BY MOUTH THREE TIMES DAILY AS NEEDED 90 tablet 0   ibuprofen (ADVIL) 600 MG  tablet TAKE ONE TABLET (600MG TOTAL) BY MOUTH EVERY 8 HOURS AS NEEDED 30 tablet 3   methocarbamol (ROBAXIN) 500 MG tablet TAKE 1 OR 2 TABLETS BY MOUTH EVERY 6 HOURS AS NEEDED FOR MUSCULAR RELAXATION 30 tablet 6   No facility-administered medications prior to visit.    Allergies  Allergen Reactions   Imitrex [Sumatriptan] Other (See Comments)    Face and Arm numbness    ROS As per HPI  PE:    01/02/2022    9:55 AM 09/30/2021   10:33 AM 06/09/2021    5:44 AM  Vitals with BMI  Height _0     Weight 148 lbs 151 lbs   BMI 33.43    Systolic [phone]  Diastolic 79 60 94  Pulse 98 62 83     Physical Exam  Gen: Alert, well appearing.  Patient is oriented to person, place, time, and situation. AFFECT: pleasant, lucid thought and speech. No further exam today.  LABS:  Last CBC Lab Results  Component Value Date   WBC 11.6 (H) 06/08/2021   HGB 11.7 (L) 06/08/2021   HCT 34.6 (L) 06/08/2021   MCV 90.6 06/08/2021   MCH 30.6 06/08/2021   RDW 12.3 06/08/2021   PLT 148 (L) 29/02/1113   Last metabolic panel Lab Results  Component Value Date   GLUCOSE 163 (H) 06/08/2021   NA 135 06/08/2021   K 3.8 06/08/2021   CL 101 06/27/2021   CO2 27 (A) 06/27/2021   BUN 6 06/27/2021   CREATININE 0.3 (A) 06/27/2021   EGFR 125.3 06/27/2021   CALCIUM 9.7 06/27/2021   PROT 6.6 01/24/2021   ALBUMIN 4.3 01/24/2021    BILITOT 0.4 01/24/2021   ALKPHOS 60 01/24/2021   AST 21 01/24/2021   ALT 14 01/24/2021   ANIONGAP 6 06/08/2021   Last lipids Lab Results  Component Value Date   CHOL 152 01/24/2021   HDL 41.00 01/24/2021   LDLCALC 81 01/24/2021   TRIG 148.0 01/24/2021   CHOLHDL 4 01/24/2021   Last thyroid functions Lab Results  Component Value Date   TSH 0.87 01/24/2021   Last vitamin D Lab Results  Component Value Date   VD25OH 37 09/08/2011   Last vitamin B12 and Folate Lab Results  Component Value Date   VITAMINB12 1,292 (H) 09/08/2011   IMPRESSION AND PLAN:  #1 chronic pain syndrome. scoliosis and multilevel spondylosis.  Myofascial pain syndrome. Recent flare/worsening after overactivity, resolving. We will continue with Vicodin 5-3 25, 1 3 times daily as needed, number 90/month.  New prescription sent today. Screening controlled substance contract up-to-date.  2.  Recurrent major depressive disorder.  Also GAD. Continue citalopram 40 mg a day and alprazolam 1 mg twice daily. She struggles with adjustment to chronic home stress from her relationship with her son.  #3 intermittent lightheaded. She eats infrequently.  When she does eat is high carb. Basic metabolic panel today. Overall I think this symptom is stress-induced.  An After Visit Summary was printed and given to the patient.  FOLLOW UP: Return in about 3 months (around 04/03/2022) for annual CPE (fasting).  Signed:  Crissie Sickles, MD           01/02/2022

## 2022-01-03 ENCOUNTER — Encounter: Payer: Self-pay | Admitting: Family Medicine

## 2022-01-26 ENCOUNTER — Other Ambulatory Visit: Payer: Self-pay | Admitting: Family Medicine

## 2022-01-26 ENCOUNTER — Telehealth: Payer: Self-pay | Admitting: Family Medicine

## 2022-02-06 ENCOUNTER — Other Ambulatory Visit: Payer: Self-pay | Admitting: Family Medicine

## 2022-02-06 NOTE — Telephone Encounter (Signed)
Requesting: [employer] Contract: 04/27/21 UDS: 09/30/21 Last Visit: 01/02/22 Next Visit: 3 month f/u not scheduled Last Refill: 01/02/22 (90,0)  Please Advise. Med pending

## 2022-02-13 ENCOUNTER — Telehealth: Payer: Self-pay

## 2022-02-21 DIAGNOSIS — L570 Actinic keratosis: Secondary | ICD-10-CM | POA: Diagnosis not present

## 2022-02-21 DIAGNOSIS — D225 Melanocytic nevi of trunk: Secondary | ICD-10-CM | POA: Diagnosis not present

## 2022-02-21 DIAGNOSIS — L820 Inflamed seborrheic keratosis: Secondary | ICD-10-CM | POA: Diagnosis not present

## 2022-03-01 ENCOUNTER — Other Ambulatory Visit: Payer: Self-pay | Admitting: Family Medicine

## 2022-03-07 ENCOUNTER — Other Ambulatory Visit: Payer: Self-pay | Admitting: Urology

## 2022-03-07 DIAGNOSIS — C642 Malignant neoplasm of left kidney, except renal pelvis: Secondary | ICD-10-CM

## 2022-03-07 DIAGNOSIS — K769 Liver disease, unspecified: Secondary | ICD-10-CM

## 2022-03-07 DIAGNOSIS — K869 Disease of pancreas, unspecified: Secondary | ICD-10-CM

## 2022-03-14 ENCOUNTER — Other Ambulatory Visit: Payer: Self-pay | Admitting: Family Medicine

## 2022-03-14 MED ORDER — ALBUTEROL SULFATE HFA 108 (90 BASE) MCG/ACT IN AERS: 2.0000 | INHALATION_SPRAY | RESPIRATORY_TRACT | 0 refills | Status: DC | PRN

## 2022-03-17 DIAGNOSIS — J069 Acute upper respiratory infection, unspecified: Secondary | ICD-10-CM | POA: Diagnosis not present

## 2022-03-19 ENCOUNTER — Other Ambulatory Visit: Payer: Self-pay | Admitting: Family Medicine

## 2022-03-20 ENCOUNTER — Telehealth: Payer: Self-pay | Admitting: Family Medicine

## 2022-03-20 NOTE — Telephone Encounter (Signed)
Copied from [place] [id number]. Topic: Medicare AWV >> Mar 20, 2022 11:53 AM Gillis Santa wrote: Reason for CRM: Called patient to schedule Medicare Annual Wellness Visit (AWV). Left message for patient to call back and schedule Medicare Annual Wellness Visit (AWV).  Last date of AWV: 03/22/2021  Please schedule an appointment at any time with Otila Kluver, [facility].  If any questions, please contact me at [phone].  Thank you ,  Shaune Pollack [facility] AWV TEAM Direct Dial [phone]

## 2022-03-20 NOTE — Telephone Encounter (Signed)
Contacted Jacqueline Vance to schedule their annual wellness visit. Appointment made for 03/29/2022.  [place] Direct Dial [phone]

## 2022-03-24 ENCOUNTER — Ambulatory Visit
Admission: RE | Admit: 2022-03-24 | Discharge: 2022-03-24 | Disposition: A | Discharge status: Home / Self Care | Payer: Medicare Other | Source: Ambulatory Visit | Attending: Urology | Admitting: Urology

## 2022-03-24 DIAGNOSIS — C642 Malignant neoplasm of left kidney, except renal pelvis: Secondary | ICD-10-CM

## 2022-03-24 DIAGNOSIS — C649 Malignant neoplasm of unspecified kidney, except renal pelvis: Secondary | ICD-10-CM | POA: Diagnosis not present

## 2022-03-24 DIAGNOSIS — K869 Disease of pancreas, unspecified: Secondary | ICD-10-CM

## 2022-03-24 DIAGNOSIS — K769 Liver disease, unspecified: Secondary | ICD-10-CM

## 2022-03-24 MED ORDER — GADOPICLENOL 0.5 MMOL/ML IV SOLN
7.5000 mL | Freq: Once | INTRAVENOUS | Status: AC | PRN
  Administered 2022-03-24: 7.5 mL via INTRAVENOUS

## 2022-03-27 ENCOUNTER — Ambulatory Visit: Payer: Medicare Other | Admitting: Family Medicine

## 2022-03-29 ENCOUNTER — Ambulatory Visit (INDEPENDENT_AMBULATORY_CARE_PROVIDER_SITE_OTHER): Payer: Medicare Other

## 2022-03-29 VITALS — Wt 148.0 lb

## 2022-03-29 DIAGNOSIS — Z0000 Encounter for general adult medical examination without abnormal findings: Secondary | ICD-10-CM | POA: Diagnosis not present

## 2022-03-29 NOTE — Progress Notes (Signed)
I connected with  Jacqueline Vance on 03/29/22 by a audio enabled telemedicine application and verified that I am speaking with the correct person using two identifiers.  Patient Location: Home  Provider Location: Home Office  I discussed the limitations of evaluation and management by telemedicine. The patient expressed understanding and agreed to proceed.   Subjective:   Jacqueline Vance is a 62 y.o. female who presents for Medicare Annual (Subsequent) preventive examination.  Review of Systems           Objective:    Today's Vitals   03/29/22 1104  Weight: 148 lb (67.1 kg)   Body mass index is 26.22 kg/m.     03/29/2022   11:11 AM 06/07/2021   12:00 PM 06/01/2021    9:14 AM 03/22/2021    2:40 PM 03/31/2020    3:04 PM 04/26/2019    3:36 PM 08/21/2016    7:09 PM  Advanced Directives  Does Patient Have a Medical Advance Directive? No No No No No No No  Would patient like information on creating a medical advance directive? No - Patient declined No - Patient declined Yes (MAU/Ambulatory/Procedural Areas - Information given) No - Patient declined       Current Medications (verified) Outpatient Encounter Medications as of 03/29/2022  Medication Sig   albuterol (VENTOLIN HFA) 108 (90 Base) MCG/ACT inhaler Inhale 2 puffs into the lungs every 4 (four) hours as needed for wheezing or shortness of breath.   ALPRAZolam (XANAX) 1 MG tablet 1 tab po bid prn anxiety   citalopram (CELEXA) 40 MG tablet Take 1 tablet (40 mg total) by mouth daily.   colchicine 0.6 MG tablet 2 tabs po at onset of gout pain, then 1 tab po one hour later   Cyanocobalamin (VITAMIN B-12 PO) Take 1 tablet by mouth daily. Reported on 04/07/2015   eletriptan (RELPAX) 40 MG tablet TAKE 1 TABLET BY MOUTH AT ONSET OF HEADACHE MAY REPEAT IN 2 HOURS IF HEADACHE PERSISTS OR RECURS.   fluorometholone (FML) 0.1 % ophthalmic suspension SMARTSIG:In Eye(s)   fluorouracil (EFUDEX) 5 % cream Apply to areas of sun damage on the face  twice daily for 4-5 days   HYDROcodone-acetaminophen (NORCO/VICODIN) 5-325 MG tablet TAKE ONE TABLET BY MOUTH THREE TIMES DAILY AS NEEDED   ibuprofen (ADVIL) 600 MG tablet TAKE ONE TABLET ('600MG'$  TOTAL) BY MOUTH EVERY 8 HOURS AS NEEDED   lidocaine (LIDODERM) 5 % 1 patch to the affected area q12h prn (Patient taking differently: Place 1 patch onto the skin daily as needed (pain). 1 patch to the affected area q12h prn)   methocarbamol (ROBAXIN) 500 MG tablet TAKE 1 OR 2 TABLETS BY MOUTH EVERY 6 HOURS AS NEEDED FOR MUSCULAR RELAXATION   pantoprazole (PROTONIX) 40 MG tablet TAKE ONE (1) TABLET BY MOUTH EVERY DAY   tiZANidine (ZANAFLEX) 4 MG tablet 1 tab po qhs prn (Patient taking differently: Take 4 mg by mouth at bedtime as needed for muscle spasms. 1 tab po qhs prn)   tretinoin (RETIN-A) 0.025 % cream Apply 1 application topically at bedtime as needed. For acne prevention/control   No facility-administered encounter medications on file as of 03/29/2022.    Allergies (verified) Imitrex [sumatriptan]   History: Past Medical History:  Diagnosis Date   Adult ADHD    Anxiety    Aortic atherosclerosis ([place]) 08/2016   Chronic back pain    neck, too.  MRI L spine 05/2008: 40 degrees dextroscoliosis due to a hemivertebra of L3.  Some DDD/spondylosis w/out spinal stenosis.   Colon cancer screening    04/2021 Cologuard negative.   Complication of anesthesia    drop in BP, difficult to wake up, shallow breathing   Depression    Diastolic dysfunction, left ventricle 05/2018   Echo with grd I DD.   GERD (gastroesophageal reflux disease)    Gout    2 attacks total as of 12/2014.   Heart murmur    Kidney lesion, native, left 03/2021   lesion consistent with renal cell carcinoma on imaging--> left partial nephrectomy 05/2021   Kidney stones 2005; 2017; 2018   Most recent CT 08/2016 showed 3 mm nonobstructing L nephrolithiasis.   Migraine    Proph tx with topamax helps, abortive tx with relpax helps.   Couldn't afford topamax so d/c'd this 04/2016.     Navicular fracture, foot 03/2018   Right Navicular fx-nonunion (surgery)   Pancreatic lesion    10/2021 4m cystic lesion, needs f/u MR 1 yr   Pulmonary nodule 2018   5 mm LLL, stable on 2 yr f/u imaging 2022-->benign, no further imaging needed.   Renal cancer, left ([place]    partial nephrectomy 06/07/21   Restless legs syndrome    [name] get to sleep fine so she declines med for this as of 08/2016   Past Surgical History:  Procedure Laterality Date   BREAST SURGERY  1999   reduction   ENDOMETRIAL ABLATION  2002   FOOT FRACTURE SURGERY  03/2018   Closed displaced fracture of right navicular, nonunion.   KIDNEY STONE SURGERY  2005   LAPAROSCOPIC VAGINAL HYSTERECTOMY  12/02/2007   for DUB   LUMBAR ESI  2021   no help ([place])   REDUCTION MAMMAPLASTY     ROBOTIC ASSITED PARTIAL NEPHRECTOMY Left 06/07/2021   Procedure: XI ROBOTIC ASSITED PARTIAL NEPHRECTOMY;  Surgeon: WCeasar Mons MD;  Location: [facility];  Service: Urology;  Laterality: Left;   TRANSTHORACIC ECHOCARDIOGRAM  06/13/2018   Grd I DD   WISDOM TOOTH EXTRACTION     Family History  Problem Relation Age of Onset   Cancer Mother        brain ca (glioblastoma)   Breast cancer Paternal Aunt    Breast cancer Maternal Grandmother    Social History   Socioeconomic History   Marital status: Married    Spouse name: Not on file   Number of children: Not on file   Years of education: Not on file   Highest education level: Not on file  Occupational History   Not on file  Tobacco Use   Smoking status: Never   Smokeless tobacco: Never  Vaping Use   Vaping Use: Never used  Substance and Sexual Activity   Alcohol use: No   Drug use: No   Sexual activity: Not on file  Other Topics Concern   Not on file  Social History Narrative   Married, one adult son.  [place]   Works for [employer] [place]    Exercise: walks 3 miles  per day.   No T/A/Ds.               Social Determinants of Health   Financial Resource Strain: Low Risk  (03/29/2022)   Overall Financial Resource Strain (CARDIA)    Difficulty of Paying Living Expenses: Not hard at all  Food Insecurity: No Food Insecurity (03/29/2022)   Hunger Vital Sign    Worried About Running Out of Food  in the Last Year: Never true    [place] in the Last Year: Never true  Transportation Needs: No Transportation Needs (03/29/2022)   PRAPARE - Hydrologist (Medical): No    Lack of Transportation (Non-Medical): No  Physical Activity: Inactive (03/29/2022)   Exercise Vital Sign    Days of Exercise per Week: 0 days    Minutes of Exercise per Session: 0 min  Stress: No Stress Concern Present (03/29/2022)   [place]    Feeling of Stress : Not at all  Social Connections: [place] (03/29/2022)   Social Connection and Isolation Panel [NHANES]    Frequency of Communication with Friends and Family: More than three times a week    Frequency of Social Gatherings with Friends and Family: More than three times a week    Attends Religious Services: More than 4 times per year    Active Member of [employer] or Organizations: Yes    Attends Archivist Meetings: 1 to 4 times per year    Marital Status: Married    Tobacco Counseling Counseling given: Not Answered   Clinical Intake:  Pre-visit preparation completed: Yes  Pain : No/denies pain     BMI - recorded: 26.22 Nutritional Status: BMI 25 -29 Overweight Nutritional Risks: None Diabetes: No  How often do you need to have someone help you when you read instructions, pamphlets, or other written materials from your doctor or pharmacy?: 1 - Never  Diabetic?no  Interpreter Needed?: No  Information entered by :: Charlott Rakes, LPN   Activities of Daily Living    03/29/2022   11:12 AM  06/07/2021   12:00 PM  In your present state of health, do you have any difficulty performing the following activities:  Hearing? 0 0  Vision? 0 0  Difficulty concentrating or making decisions? 0 1  Walking or climbing stairs? 1 1  Comment take your time   Dressing or bathing? 0 0  Doing errands, shopping? 0 0  Preparing Food and eating ? N   Using the Toilet? N   In the past six months, have you accidently leaked urine? N   Do you have problems with loss of bowel control? N   Managing your Medications? N   Managing your Finances? N   Housekeeping or managing your Housekeeping? N     Patient Care Team: Tammi Sou, MD as PCP - General (Family Medicine) Arvella Nigh, MD as Consulting Physician (Obstetrics and Gynecology) Wylene Simmer, MD as Consulting Physician (Orthopedic Surgery) Levy Pupa, PA-C as Physician Assistant (Orthopedic Surgery) Ceasar Mons, MD as Consulting Physician (Urology)  Indicate any recent Medical Services you may have received from other than [facility] providers in the past year (date may be approximate).     Assessment:   This is a routine wellness examination for Jacqueline Vance.  Hearing/Vision screen Hearing Screening - Comments:: Pt denies any hearing issues  Vision Screening - Comments:: Pt follows up with Dr Berneda Rose for annual eye exams   Dietary issues and exercise activities discussed: Current Exercise Habits: The patient does not participate in regular exercise at present   Goals Addressed             This Visit's Progress    Patient Stated       Lose 10 lbs        Depression Screen    03/29/2022   11:08 AM  01/02/2022   10:10 AM 09/30/2021   10:35 AM 04/28/2021    9:29 AM 03/22/2021    2:41 PM 10/06/2020   11:26 AM 05/17/2020    1:54 PM  PHQ 2/9 Scores  PHQ - 2 Score 0 0 0 0 0 0 0  PHQ- 9 Score    0  0 0    Fall Risk    03/29/2022   11:12 AM 01/02/2022   10:10 AM 03/22/2021    2:40 PM 10/06/2020   11:26 AM   Fall Risk   Falls in the past year? 0 0 0 0  Number falls in past yr: 0  0 0  Injury with Fall? 0  0 0  Risk for fall due to : Impaired balance/gait;Impaired vision No Fall Risks No Fall Risks   Follow up Falls prevention discussed Falls evaluation completed Falls evaluation completed Falls evaluation completed    [place]:  Any stairs in or around the home? Yes  If so, are there any without handrails? No  Home free of loose throw rugs in walkways, pet beds, electrical cords, etc? Yes  Adequate lighting in your home to reduce risk of falls? Yes   ASSISTIVE DEVICES UTILIZED TO PREVENT FALLS:  Life alert? No  Use of a cane, walker or w/c? No  Grab bars in the bathroom? No  Shower chair or bench in shower? No  Elevated toilet seat or a handicapped toilet? No   TIMED UP AND GO:  Was the test performed? No .   Cognitive Function:        03/29/2022   11:13 AM 03/22/2021    2:42 PM  6CIT Screen  What Year? 0 points 0 points  What month? 0 points 0 points  What time? 0 points 0 points  Count back from 20 0 points 0 points  Months in reverse 0 points 0 points  Repeat phrase 0 points 0 points  Total Score 0 points 0 points    Immunizations Immunization History  Administered Date(s) Administered   Hepatitis B 08/10/2010, 05/26/2011, 09/08/2011   Influenza,inj,Quad PF,6+ Mos 11/21/2013, 01/15/2015, 09/22/2016, 09/13/2017, 10/04/2018, 10/28/2019, 10/06/2020, 09/30/2021   Influenza-Unspecified 11/01/2017   Tdap 08/10/2010   Zoster Recombinat (Shingrix) 10/06/2020, 01/24/2021    TDAP status: Due, Education has been provided regarding the importance of this vaccine. Advised may receive this vaccine at local pharmacy or Health Dept. Aware to provide a copy of the vaccination record if obtained from local pharmacy or Health Dept. Verbalized acceptance and understanding.  Flu Vaccine status: Up to date    Covid-19 vaccine status: Declined,  Education has been provided regarding the importance of this vaccine but patient still declined. Advised may receive this vaccine at local pharmacy or Health Dept.or vaccine clinic. Aware to provide a copy of the vaccination record if obtained from local pharmacy or Health Dept. Verbalized acceptance and understanding.  Qualifies for Shingles Vaccine? Yes   Zostavax completed Yes   Shingrix Completed?: Yes  Screening Tests Health Maintenance  Topic Date Due   COVID-19 Vaccine (1) Never done   DTaP/Tdap/Td (2 - Td or Tdap) 08/09/2020   MAMMOGRAM  05/14/2022   Medicare Annual Wellness (AWV)  03/29/2023   Fecal DNA (Cologuard)  05/03/2024   INFLUENZA VACCINE  Completed   Zoster Vaccines- Shingrix  Completed   HPV VACCINES  Aged Out   Hepatitis C Screening  Discontinued   HIV Screening  Discontinued    Health Maintenance  Health Maintenance Due  Topic Date Due   COVID-19 Vaccine (1) Never done   DTaP/Tdap/Td (2 - Td or Tdap) 08/09/2020    Colorectal cancer screening: Type of screening: Cologuard. Completed 05/03/21. Repeat every 3 years  Mammogram status: Completed 05/13/21. Repeat every year    Additional Screening:  Hepatitis C Screening: does not qualify;  Vision Screening: Recommended annual ophthalmology exams for early detection of glaucoma and other disorders of the eye. Is the patient up to date with their annual eye exam?  Yes  Who is the provider or what is the name of the office in which the patient attends annual eye exams? Dr Berneda Rose If pt is not established with a provider, would they like to be referred to a provider to establish care? No .   Dental Screening: Recommended annual dental exams for proper oral hygiene  Community Resource Referral / Chronic Care Management: CRR required this visit?  No   CCM required this visit?  No      Plan:     I have personally reviewed and noted the following in the patient's chart:   Medical and social  history Use of alcohol, tobacco or illicit drugs  Current medications and supplements including opioid prescriptions.  Pt is currently taking an opioid  Functional ability and status Nutritional status Physical activity Advanced directives List of other physicians Hospitalizations, surgeries, and ER visits in previous 12 months Vitals Screenings to include cognitive, depression, and falls Referrals and appointments  In addition, I have reviewed and discussed with patient certain preventive protocols, quality metrics, and best practice recommendations. A written personalized care plan for preventive services as well as general preventive health recommendations were provided to patient.     Willette Brace, LPN   [id number]   Nurse Notes: none

## 2022-03-29 NOTE — Patient Instructions (Signed)
Ms. Allston , Thank you for taking time to come for your Medicare Wellness Visit. I appreciate your ongoing commitment to your health goals. Please review the following plan we discussed and let me know if I can assist you in the future.   These are the goals we discussed:  Goals      Patient Stated     Lose 10 lbs         This is a list of the screening recommended for you and due dates:  Health Maintenance  Topic Date Due   COVID-19 Vaccine (1) Never done   DTaP/Tdap/Td vaccine (2 - Td or Tdap) 08/09/2020   Mammogram  05/14/2022   Medicare Annual Wellness Visit  03/29/2023   Cologuard (Stool DNA test)  05/03/2024   Flu Shot  Completed   Zoster (Shingles) Vaccine  Completed   HPV Vaccine  Aged Out   Hepatitis C Screening: USPSTF Recommendation to screen - Ages 15-79 yo.  Discontinued   HIV Screening  Discontinued    Advanced directives: Advance directive discussed with you today. Even though you declined this today please call our office should you change your mind and we can give you the proper paperwork for you to fill out.  Conditions/risks identified: lose 10 lbs   Next appointment: Follow up in one year for your annual wellness visit.   Preventive Care 40-64 Years, Female Preventive care refers to lifestyle choices and visits with your health care provider that can promote health and wellness. What does preventive care include? A yearly physical exam. This is also called an annual well check. Dental exams once or twice a year. Routine eye exams. Ask your health care provider how often you should have your eyes checked. Personal lifestyle choices, including: Daily care of your teeth and gums. Regular physical activity. Eating a healthy diet. Avoiding tobacco and drug use. Limiting alcohol use. Practicing safe sex. Taking low-dose aspirin daily starting at age 41. Taking vitamin and mineral supplements as recommended by your health care provider. What happens during  an annual well check? The services and screenings done by your health care provider during your annual well check will depend on your age, overall health, lifestyle risk factors, and family history of disease. Counseling  Your health care provider may ask you questions about your: Alcohol use. Tobacco use. Drug use. Emotional well-being. Home and relationship well-being. Sexual activity. Eating habits. Work and work Statistician. Method of birth control. Menstrual cycle. Pregnancy history. Screening  You may have the following tests or measurements: Height, weight, and BMI. Blood pressure. Lipid and cholesterol levels. These may be checked every 5 years, or more frequently if you are over 105 years old. Skin check. Lung cancer screening. You may have this screening every year starting at age 60 if you have a 30-pack-year history of smoking and currently smoke or have quit within the past 15 years. Fecal occult blood test (FOBT) of the stool. You may have this test every year starting at age 4. Flexible sigmoidoscopy or colonoscopy. You may have a sigmoidoscopy every 5 years or a colonoscopy every 10 years starting at age 42. Hepatitis C blood test. Hepatitis B blood test. Sexually transmitted disease (STD) testing. Diabetes screening. This is done by checking your blood sugar (glucose) after you have not eaten for a while (fasting). You may have this done every 1-3 years. Mammogram. This may be done every 1-2 years. Talk to your health care provider about when you should start having  regular mammograms. This may depend on whether you have a family history of breast cancer. BRCA-related cancer screening. This may be done if you have a family history of breast, ovarian, tubal, or peritoneal cancers. Pelvic exam and Pap test. This may be done every 3 years starting at age 7. Starting at age 37, this may be done every 5 years if you have a Pap test in combination with an HPV test. Bone  density scan. This is done to screen for osteoporosis. You may have this scan if you are at high risk for osteoporosis. Discuss your test results, treatment options, and if necessary, the need for more tests with your health care provider. Vaccines  Your health care provider may recommend certain vaccines, such as: Influenza vaccine. This is recommended every year. Tetanus, diphtheria, and acellular pertussis (Tdap, Td) vaccine. You may need a Td booster every 10 years. Zoster vaccine. You may need this after age 73. Pneumococcal 13-valent conjugate (PCV13) vaccine. You may need this if you have certain conditions and were not previously vaccinated. Pneumococcal polysaccharide (PPSV23) vaccine. You may need one or two doses if you smoke cigarettes or if you have certain conditions. Talk to your health care provider about which screenings and vaccines you need and how often you need them. This information is not intended to replace advice given to you by your health care provider. Make sure you discuss any questions you have with your health care provider. Document Released: 01/29/2015 Document Revised: 09/22/2015 Document Reviewed: 11/03/2014 Elsevier Interactive Patient Education  2017 [place] Prevention in the Home Falls can cause injuries. They can happen to people of all ages. There are many things you can do to make your home safe and to help prevent falls. What can I do on the outside of my home? Regularly fix the edges of walkways and driveways and fix any cracks. Remove anything that might make you trip as you walk through a door, such as a raised step or threshold. Trim any bushes or trees on the path to your home. Use bright outdoor lighting. Clear any walking paths of anything that might make someone trip, such as rocks or tools. Regularly check to see if handrails are loose or broken. Make sure that both sides of any steps have handrails. Any raised decks and  porches should have guardrails on the edges. Have any leaves, snow, or ice cleared regularly. Use sand or salt on walking paths during winter. Clean up any spills in your garage right away. This includes oil or grease spills. What can I do in the bathroom? Use night lights. Install grab bars by the toilet and in the tub and shower. Do not use towel bars as grab bars. Use non-skid mats or decals in the tub or shower. If you need to sit down in the shower, use a plastic, non-slip stool. Keep the floor dry. Clean up any water that spills on the floor as soon as it happens. Remove soap buildup in the tub or shower regularly. Attach bath mats securely with double-sided non-slip rug tape. Do not have throw rugs and other things on the floor that can make you trip. What can I do in the bedroom? Use night lights. Make sure that you have a light by your bed that is easy to reach. Do not use any sheets or blankets that are too big for your bed. They should not hang down onto the floor. Have a firm chair that has  side arms. You can use this for support while you get dressed. Do not have throw rugs and other things on the floor that can make you trip. What can I do in the kitchen? Clean up any spills right away. Avoid walking on wet floors. Keep items that you use a lot in easy-to-reach places. If you need to reach something above you, use a strong step stool that has a grab bar. Keep electrical cords out of the way. Do not use floor polish or wax that makes floors slippery. If you must use wax, use non-skid floor wax. Do not have throw rugs and other things on the floor that can make you trip. What can I do with my stairs? Do not leave any items on the stairs. Make sure that there are handrails on both sides of the stairs and use them. Fix handrails that are broken or loose. Make sure that handrails are as long as the stairways. Check any carpeting to make sure that it is firmly attached to the  stairs. Fix any carpet that is loose or worn. Avoid having throw rugs at the top or bottom of the stairs. If you do have throw rugs, attach them to the floor with carpet tape. Make sure that you have a light switch at the top of the stairs and the bottom of the stairs. If you do not have them, ask someone to add them for you. What else can I do to help prevent falls? Wear shoes that: Do not have high heels. Have rubber bottoms. Are comfortable and fit you well. Are closed at the toe. Do not wear sandals. If you use a stepladder: Make sure that it is fully opened. Do not climb a closed stepladder. Make sure that both sides of the stepladder are locked into place. Ask someone to hold it for you, if possible. Clearly mark and make sure that you can see: Any grab bars or handrails. First and last steps. Where the edge of each step is. Use tools that help you move around (mobility aids) if they are needed. These include: Canes. Walkers. Scooters. Crutches. Turn on the lights when you go into a dark area. Replace any light bulbs as soon as they burn out. Set up your furniture so you have a clear path. Avoid moving your furniture around. If any of your floors are uneven, fix them. If there are any pets around you, be aware of where they are. Review your medicines with your doctor. Some medicines can make you feel dizzy. This can increase your chance of falling. Ask your doctor what other things that you can do to help prevent falls. This information is not intended to replace advice given to you by your health care provider. Make sure you discuss any questions you have with your health care provider. Document Released: 10/29/2008 Document Revised: 06/10/2015 Document Reviewed: 02/06/2014 Elsevier Interactive Patient Education  2017 [employer].

## 2022-04-05 ENCOUNTER — Other Ambulatory Visit: Payer: Self-pay | Admitting: Family Medicine

## 2022-04-06 NOTE — Telephone Encounter (Signed)
My chart message sent advising pt that an appt is needed

## 2022-04-07 NOTE — Telephone Encounter (Signed)
Please decline until appt scheduled

## 2022-04-07 NOTE — Telephone Encounter (Signed)
I went ahead and sent in a 2-week supply to make sure she did not run out completely before her appointment.

## 2022-04-07 NOTE — Telephone Encounter (Signed)
Pt read mychart message regarding needing appt for refills. Pt is overdue for appt.

## 2022-04-10 ENCOUNTER — Encounter: Payer: Self-pay | Admitting: Family Medicine

## 2022-04-10 ENCOUNTER — Ambulatory Visit (INDEPENDENT_AMBULATORY_CARE_PROVIDER_SITE_OTHER): Payer: Medicare Other | Admitting: Family Medicine

## 2022-04-10 VITALS — BP 104/70 | HR 58 | Temp 98.0°F | Ht 63.0 in | Wt 150.2 lb

## 2022-04-10 DIAGNOSIS — F411 Generalized anxiety disorder: Secondary | ICD-10-CM | POA: Diagnosis not present

## 2022-04-10 DIAGNOSIS — Z0000 Encounter for general adult medical examination without abnormal findings: Secondary | ICD-10-CM | POA: Diagnosis not present

## 2022-04-10 DIAGNOSIS — F339 Major depressive disorder, recurrent, unspecified: Secondary | ICD-10-CM

## 2022-04-10 DIAGNOSIS — G894 Chronic pain syndrome: Secondary | ICD-10-CM

## 2022-04-10 DIAGNOSIS — G9331 Postviral fatigue syndrome: Secondary | ICD-10-CM

## 2022-04-10 DIAGNOSIS — Z79899 Other long term (current) drug therapy: Secondary | ICD-10-CM | POA: Diagnosis not present

## 2022-04-10 MED ORDER — FLUCONAZOLE 150 MG PO TABS: ORAL_TABLET | ORAL | 0 refills | Status: DC

## 2022-04-10 MED ORDER — ALPRAZOLAM 1 MG PO TABS: ORAL_TABLET | ORAL | 1 refills | Status: DC

## 2022-04-10 MED ORDER — HYDROCODONE-ACETAMINOPHEN 5-325 MG PO TABS: 1.0000 | ORAL_TABLET | Freq: Three times a day (TID) | ORAL | 0 refills | Status: DC | PRN

## 2022-04-10 NOTE — Patient Instructions (Signed)

## 2022-04-10 NOTE — Progress Notes (Signed)
Office Note 04/10/2022  CC:  Chief Complaint  Patient presents with   Medical Management of Chronic Issues   HPI:  Patient is a 62 y.o. female who is here for annual health maintenance exam and follow-up chronic pain syndrome and chronic anxiety and recurrent depression  Reports about 5 to 6 weeks of runny nose, sore throat, and bilateral ear pain.  After about 2 weeks of the symptoms she was treated with antibiotics and steroids.  This did not change anything.  She never really got any cough.  No fevers.  She has chronic fatigue, unchanged.  She does say her tongue hurts when she eats and she feels like her mouth is extremely dry and somewhat sore.  No focal lesions.  Pain is stable.  No adverse effects from her medications. Indication for chronic opioid: neck, mid back, low back, HA's, myofascial pain, arthralgias of appendicular skeleton.  Some days finds it hard to get out of bed and function.   She carries dx of DDD of C and L spine, significant scoliosis, myofascial pain syndrome with trigger points and recurrent trochanteric bursitis.  Has seen a handful of specialists in the past (spine and scoliosis center in [place], [facility], neurolog[place], [place], also [facility] and wellness center in [place]).  She has never been managed by a pain mgmt specialist.   Medication and dose: vicodin 5/325, 1 tid prn, #90 per mo. PMP AWARE reviewed today: most recent rx for Vicodin was filled 02/06/2022, # 23, rx by me.  Anxiety level stable, no depressed mood.  She is still dealing with quite a bit of stress at home. Most recent alprazolam prescription was filled 12/31/2021, #180, prescription by me. No red flags.   Past Medical History:  Diagnosis Date   Adult ADHD    Anxiety    Aortic atherosclerosis ([place]) 08/2016   Chronic back pain    neck, too.  MRI L spine 05/2008: 40 degrees dextroscoliosis due to a hemivertebra of L3.  Some DDD/spondylosis w/out spinal stenosis.   Colon cancer screening    04/2021  Cologuard negative.   Complication of anesthesia    drop in BP, difficult to wake up, shallow breathing   Depression    Diastolic dysfunction, left ventricle 05/2018   Echo with grd I DD.   GERD (gastroesophageal reflux disease)    Gout    2 attacks total as of 12/2014.   Heart murmur    Kidney lesion, native, left 03/2021   lesion consistent with renal cell carcinoma on imaging--> left partial nephrectomy 05/2021   Kidney stones 2005; 2017; 2018   Most recent CT 08/2016 showed 3 mm nonobstructing L nephrolithiasis.   Migraine    Proph tx with topamax helps, abortive tx with relpax helps.  Couldn't afford topamax so d/c'd this 04/2016.     Navicular fracture, foot 03/2018   Right Navicular fx-nonunion (surgery)   Pancreatic lesion    10/2021 28mm cystic lesion, needs f/u MR 1 yr   Pulmonary nodule 2018   5 mm LLL, stable on 2 yr f/u imaging 2022-->benign, no further imaging needed.   Renal cancer, left ([facility])    partial nephrectomy 06/07/21   Restless legs syndrome    Pt can get to sleep fine so she declines med for this as of 08/2016    Past Surgical History:  Procedure Laterality Date   BREAST SURGERY  1999   reduction   ENDOMETRIAL ABLATION  2002   FOOT FRACTURE SURGERY  03/2018  Closed displaced fracture of right navicular, nonunion.   KIDNEY STONE SURGERY  2005   LAPAROSCOPIC VAGINAL HYSTERECTOMY  12/02/2007   for DUB   LUMBAR ESI  2021   no help ([facility])   REDUCTION MAMMAPLASTY     ROBOTIC ASSITED PARTIAL NEPHRECTOMY Left 06/07/2021   Procedure: XI ROBOTIC ASSITED PARTIAL NEPHRECTOMY;  Surgeon: Ceasar Mons, MD;  Location: [facility];  Service: Urology;  Laterality: Left;   TRANSTHORACIC ECHOCARDIOGRAM  06/13/2018   Grd I DD   WISDOM TOOTH EXTRACTION      Family History  Problem Relation Age of Onset   Cancer Mother        brain ca (glioblastoma)   Breast cancer Paternal Aunt    Breast cancer Maternal Grandmother     Social History   Socioeconomic  History   Marital status: Married    Spouse name: Not on file   Number of children: Not on file   Years of education: Not on file   Highest education level: Not on file  Occupational History   Not on file  Tobacco Use   Smoking status: Never   Smokeless tobacco: Never  Vaping Use   Vaping Use: Never used  Substance and Sexual Activity   Alcohol use: No   Drug use: No   Sexual activity: Not on file  Other Topics Concern   Not on file  Social History Narrative   Married, one adult son.  Lives in [place], [place].   Works for [employer] in [place].    Exercise: walks 3 miles per day.   No T/A/Ds.               Social Determinants of Health   Financial Resource Strain: Low Risk  (03/29/2022)   Overall Financial Resource Strain (CARDIA)    Difficulty of Paying Living Expenses: Not hard at all  Food Insecurity: No Food Insecurity (03/29/2022)   Hunger Vital Sign    Worried About Running Out of Food in the Last Year: Never true    Ran Out of Food in the Last Year: Never true  Transportation Needs: No Transportation Needs (03/29/2022)   PRAPARE - Hydrologist (Medical): No    Lack of Transportation (Non-Medical): No  Physical Activity: Inactive (03/29/2022)   Exercise Vital Sign    Days of Exercise per Week: 0 days    Minutes of Exercise per Session: 0 min  Stress: No Stress Concern Present (03/29/2022)   [place]    Feeling of Stress : Not at all  Social Connections: [place] (03/29/2022)   Social Connection and Isolation Panel [NHANES]    Frequency of Communication with Friends and Family: More than three times a week    Frequency of Social Gatherings with Friends and Family: More than three times a week    Attends Religious Services: More than 4 times per year    Active Member of [employer] or Organizations: Yes    Attends Archivist Meetings:  1 to 4 times per year    Marital Status: Married  Human resources officer Violence: Not At Risk (03/29/2022)   Humiliation, Afraid, Rape, and Kick questionnaire    Fear of Current or Ex-Partner: No    Emotionally Abused: No    Physically Abused: No    Sexually Abused: No    Outpatient Medications Prior to Visit  Medication Sig Dispense Refill   albuterol (VENTOLIN HFA) 108 (  90 Base) MCG/ACT inhaler Inhale 2 puffs into the lungs every 4 (four) hours as needed for wheezing or shortness of breath. 18 g 0   citalopram (CELEXA) 40 MG tablet Take 1 tablet (40 mg total) by mouth daily. 90 tablet 1   Cyanocobalamin (VITAMIN B-12 PO) Take 1 tablet by mouth daily. Reported on 04/07/2015     eletriptan (RELPAX) 40 MG tablet TAKE 1 TABLET BY MOUTH AT ONSET OF HEADACHE MAY REPEAT IN 2 HOURS IF HEADACHE PERSISTS OR RECURS. 18 tablet 2   fluorometholone (FML) 0.1 % ophthalmic suspension SMARTSIG:In Eye(s)     fluorouracil (EFUDEX) 5 % cream Apply to areas of sun damage on the face twice daily for 4-5 days 40 g 6   pantoprazole (PROTONIX) 40 MG tablet TAKE ONE (1) TABLET BY MOUTH EVERY DAY 90 tablet 3   tretinoin (RETIN-A) 0.025 % cream Apply 1 application topically at bedtime as needed. For acne prevention/control 45 g 3   ALPRAZolam (XANAX) 1 MG tablet TAKE 1 TABLET BY MOUTH TWICE A DAY AS NEEDED ANXIETY 30 tablet 0   HYDROcodone-acetaminophen (NORCO/VICODIN) 5-325 MG tablet TAKE ONE TABLET BY MOUTH THREE TIMES DAILY AS NEEDED 45 tablet 0   colchicine 0.6 MG tablet 2 tabs po at onset of gout pain, then 1 tab po one hour later (Patient not taking: Reported on 04/10/2022) 15 tablet 2   ibuprofen (ADVIL) 600 MG tablet TAKE ONE TABLET (600MG  TOTAL) BY MOUTH EVERY 8 HOURS AS NEEDED (Patient not taking: Reported on 04/10/2022) 30 tablet 3   lidocaine (LIDODERM) 5 % 1 patch to the affected area q12h prn (Patient not taking: Reported on 04/10/2022) 90 patch 3   methocarbamol (ROBAXIN) 500 MG tablet TAKE 1 OR 2 TABLETS BY  MOUTH EVERY 6 HOURS AS NEEDED FOR MUSCULAR RELAXATION (Patient not taking: Reported on 04/10/2022) 30 tablet 6   tiZANidine (ZANAFLEX) 4 MG tablet 1 tab po qhs prn (Patient not taking: Reported on 04/10/2022) 90 tablet 1   No facility-administered medications prior to visit.    Allergies  Allergen Reactions   Imitrex [Sumatriptan] Other (See Comments)    Face and Arm numbness    Review of Systems  Constitutional:  Positive for fatigue (chronic). Negative for appetite change, chills and fever.  HENT:  Positive for ear pain, rhinorrhea and sore throat. Negative for congestion and dental problem.   Eyes:  Negative for discharge, redness and visual disturbance.  Respiratory:  Negative for cough, chest tightness, shortness of breath and wheezing.   Cardiovascular:  Negative for chest pain, palpitations and leg swelling.  Gastrointestinal:  Negative for abdominal pain, blood in stool, diarrhea, nausea and vomiting.  Genitourinary:  Negative for difficulty urinating, dysuria, flank pain, frequency, hematuria and urgency.  Musculoskeletal:  Negative for arthralgias, back pain, joint swelling, myalgias and neck stiffness.  Skin:  Negative for pallor and rash.  Neurological:  Negative for dizziness, speech difficulty, weakness and headaches.  Hematological:  Negative for adenopathy. Does not bruise/bleed easily.  Psychiatric/Behavioral:  Negative for confusion and sleep disturbance. The patient is not nervous/anxious.     PE;    04/10/2022    2:58 PM 03/29/2022   11:04 AM 01/02/2022    9:55 AM  Vitals with BMI  Height 5\' 3"   5\' 3"   Weight 150 lbs 3 oz 148 lbs 148 lbs  BMI [id number]  [id number]  Systolic [id number]  [id number]  Diastolic 70  79  Pulse 58  98   Exam chaperoned by Deveron Furlong,  CMA. Gen: Alert, well appearing.  Patient is oriented to person, place, time, and situation. AFFECT: pleasant, lucid thought and speech. ENT: Ears: EACs clear, normal epithelium.  TMs with good light reflex and  landmarks bilaterally.  Eyes: no injection, icteris, swelling, or exudate.  EOMI, PERRLA. Nose: no drainage or turbinate edema/swelling.  No injection or focal lesion.  Mouth: lips without lesion/swelling.  Oral mucosa pink and moist.  Dentition intact and without obvious caries or gingival swelling.  Oropharynx without erythema, exudate, or swelling.  Neck: supple/nontender.  No LAD, mass, or TM.  Carotid pulses 2+ bilaterally, without bruits. CV: RRR, no m/r/g.   LUNGS: CTA bilat, nonlabored resps, good aeration in all lung fields. ABD: soft, NT, ND, BS normal.  No hepatospenomegaly or mass.  No bruits. EXT: no clubbing, cyanosis, or edema.  Musculoskeletal: no joint swelling, erythema, warmth, or tenderness.  ROM of all joints intact. Skin - no sores or suspicious lesions or rashes or color changes  Pertinent labs:  Lab Results  Component Value Date   TSH 0.87 01/24/2021   Lab Results  Component Value Date   WBC 11.6 (H) 06/08/2021   HGB 11.7 (L) 06/08/2021   HCT 34.6 (L) 06/08/2021   MCV 90.6 06/08/2021   PLT 148 (L) 06/08/2021   Lab Results  Component Value Date   VITAMINB12 1,292 (H) 09/08/2011    Lab Results  Component Value Date   CREATININE 0.91 01/02/2022   BUN 9 01/02/2022   NA 142 01/02/2022   K 4.9 01/02/2022   CL 105 01/02/2022   CO2 30 01/02/2022   Lab Results  Component Value Date   ALT 14 01/24/2021   AST 21 01/24/2021   ALKPHOS 60 01/24/2021   BILITOT 0.4 01/24/2021   Lab Results  Component Value Date   CHOL 152 01/24/2021   Lab Results  Component Value Date   HDL 41.00 01/24/2021   Lab Results  Component Value Date   LDLCALC 81 01/24/2021   Lab Results  Component Value Date   TRIG 148.0 01/24/2021   Lab Results  Component Value Date   CHOLHDL 4 01/24/2021    ASSESSMENT AND PLAN:   #1 health maintenance exam: Reviewed age and gender appropriate health maintenance issues (prudent diet, regular exercise, health risks of tobacco and  excessive alcohol, use of seatbelts, fire alarms in home, use of sunscreen).  Also reviewed age and gender appropriate health screening as well as vaccine recommendations. Vaccines: Tdap-->rx sent to pharmacy.  Otherwise UTD. LABS: health panel ordered today Cervical ca screening: hx of hysterectomy for benign dx-->no further pelvics/paps indicated. Breast ca screening: normal mammogram for 2023.  Due for repeat.  Colon cancer screening: Cologuard negative 2023.  Repeat 2026.  2.  chronic pain syndrome. scoliosis and multilevel spondylosis.  Myofascial pain syndrome. We will continue with Vicodin 5-3 25, 1 3 times daily as needed, number 90/month.  New prescription sent today. controlled substance contract up-to-date.  3. Recurrent major depressive disorder.  Also GAD. Continue citalopram 40 mg a day and alprazolam 1 mg twice daily. She struggles with adjustment to chronic home stress from her relationship with her son.  #4 postviral syndrome.  Burning tongue, ear pain, rhinorrhea. Reassured.  #5 history of renal cancer, status post left nephrectomy. Surveillance imaging earlier this month showed no sign of recurrence. It showed tiny pancreatic cystic lesions which were stable compared to 6 months prior.  An After Visit Summary was printed and given to the patient.  FOLLOW  UP:  Return in about 3 months (around 07/11/2022) for routine chronic illness f/u.  Signed:  Crissie Sickles, MD           04/10/2022

## 2022-04-11 LAB — CBC WITH DIFFERENTIAL/PLATELET
Basophils Absolute: 0 10*3/uL (ref 0.0–0.1)
Basophils Relative: 0.9 % (ref 0.0–3.0)
Eosinophils Absolute: 0.1 10*3/uL (ref 0.0–0.7)
Eosinophils Relative: 1.6 % (ref 0.0–5.0)
HCT: 40.2 % (ref 36.0–46.0)
Hemoglobin: 13.4 g/dL (ref 12.0–15.0)
Lymphocytes Relative: 48.5 % — ABNORMAL HIGH (ref 12.0–46.0)
Lymphs Abs: 2.4 10*3/uL (ref 0.7–4.0)
MCHC: 33.4 g/dL (ref 30.0–36.0)
MCV: 89.5 fl (ref 78.0–100.0)
Monocytes Absolute: 0.4 10*3/uL (ref 0.1–1.0)
Monocytes Relative: 8.7 % (ref 3.0–12.0)
Neutro Abs: 2 10*3/uL (ref 1.4–7.7)
Neutrophils Relative %: 40.3 % — ABNORMAL LOW (ref 43.0–77.0)
Platelets: 210 10*3/uL (ref 150.0–400.0)
RBC: 4.49 Mil/uL (ref 3.87–5.11)
RDW: 13.1 % (ref 11.5–15.5)
WBC: 5 10*3/uL (ref 4.0–10.5)

## 2022-04-11 LAB — COMPREHENSIVE METABOLIC PANEL
ALT: 12 U/L (ref 0–35)
AST: 20 U/L (ref 0–37)
Albumin: 4.4 g/dL (ref 3.5–5.2)
Alkaline Phosphatase: 64 U/L (ref 39–117)
BUN: 8 mg/dL (ref 6–23)
CO2: 32 mEq/L (ref 19–32)
Calcium: 9.7 mg/dL (ref 8.4–10.5)
Chloride: 101 mEq/L (ref 96–112)
Creatinine, Ser: 0.83 mg/dL (ref 0.40–1.20)
GFR: 76.96 mL/min (ref >60.00)
Glucose, Bld: 81 mg/dL (ref 70–99)
Potassium: 4.6 mEq/L (ref 3.5–5.1)
Sodium: 138 mEq/L (ref 135–145)
Total Bilirubin: 0.4 mg/dL (ref 0.2–1.2)
Total Protein: 6.6 g/dL (ref 6.0–8.3)

## 2022-04-11 LAB — LIPID PANEL
Cholesterol: 194 mg/dL (ref 0–200)
HDL: 52.4 mg/dL (ref >39.00)
LDL Cholesterol: 115 mg/dL — ABNORMAL HIGH (ref 0–99)
NonHDL: 141.11
Total CHOL/HDL Ratio: 4
Triglycerides: 132 mg/dL (ref 0.0–149.0)
VLDL: 26.4 mg/dL (ref 0.0–40.0)

## 2022-04-11 LAB — TSH: TSH: 0.71 u[IU]/mL (ref 0.35–5.50)

## 2022-04-15 DIAGNOSIS — B370 Candidal stomatitis: Secondary | ICD-10-CM | POA: Diagnosis not present

## 2022-04-27 DIAGNOSIS — C642 Malignant neoplasm of left kidney, except renal pelvis: Secondary | ICD-10-CM | POA: Diagnosis not present

## 2022-06-05 ENCOUNTER — Other Ambulatory Visit: Payer: Self-pay | Admitting: Family Medicine

## 2022-06-06 NOTE — Telephone Encounter (Signed)
Requesting: [employer] Contract: 05/06/21 UDS: 09/30/21 Last Visit: 04/10/22 Next Visit: 07/11/22 Last Refill: 04/10/22 (90,0)  Please Advise. Med pending

## 2022-07-10 ENCOUNTER — Other Ambulatory Visit: Payer: Self-pay | Admitting: Family Medicine

## 2022-07-10 ENCOUNTER — Encounter: Payer: Self-pay | Admitting: Family Medicine

## 2022-07-11 ENCOUNTER — Ambulatory Visit: Payer: Medicare Other | Admitting: Family Medicine

## 2022-07-25 ENCOUNTER — Other Ambulatory Visit: Payer: Self-pay | Admitting: Family Medicine

## 2022-08-08 NOTE — Patient Instructions (Signed)

## 2022-08-09 ENCOUNTER — Encounter: Payer: Self-pay | Admitting: Family Medicine

## 2022-08-09 ENCOUNTER — Ambulatory Visit: Payer: Medicare Other | Admitting: Family Medicine

## 2022-08-09 VITALS — BP 104/73 | HR 66 | Wt 147.6 lb

## 2022-08-09 DIAGNOSIS — M7918 Myalgia, other site: Secondary | ICD-10-CM

## 2022-08-09 DIAGNOSIS — Z23 Encounter for immunization: Secondary | ICD-10-CM

## 2022-08-09 DIAGNOSIS — F3340 Major depressive disorder, recurrent, in remission, unspecified: Secondary | ICD-10-CM

## 2022-08-09 DIAGNOSIS — G894 Chronic pain syndrome: Secondary | ICD-10-CM | POA: Diagnosis not present

## 2022-08-09 DIAGNOSIS — M4125 Other idiopathic scoliosis, thoracolumbar region: Secondary | ICD-10-CM

## 2022-08-09 DIAGNOSIS — Z79899 Other long term (current) drug therapy: Secondary | ICD-10-CM | POA: Diagnosis not present

## 2022-08-09 DIAGNOSIS — F411 Generalized anxiety disorder: Secondary | ICD-10-CM

## 2022-08-09 MED ORDER — CITALOPRAM HYDROBROMIDE 40 MG PO TABS: ORAL_TABLET | ORAL | 3 refills | Status: DC

## 2022-08-09 MED ORDER — ALPRAZOLAM 1 MG PO TABS: ORAL_TABLET | ORAL | 1 refills | Status: DC

## 2022-08-09 MED ORDER — PANTOPRAZOLE SODIUM 40 MG PO TBEC: DELAYED_RELEASE_TABLET | ORAL | 3 refills | Status: DC

## 2022-08-09 MED ORDER — HYDROCODONE-ACETAMINOPHEN 5-325 MG PO TABS: 1.0000 | ORAL_TABLET | Freq: Three times a day (TID) | ORAL | 0 refills | Status: DC | PRN

## 2022-08-09 NOTE — Progress Notes (Signed)
OFFICE VISIT  08/09/2022  CC:  Chief Complaint  Patient presents with   Medical Management of Chronic Issues    Patient is a 62 y.o. female who presents for 48-month follow-up chronic pain syndrome, chronic anxiety, and recurrent depression. A/P as of last visit: " 1 chronic pain syndrome. scoliosis and multilevel spondylosis.  Myofascial pain syndrome. We will continue with Vicodin 5-3 25, 1 3 times daily as needed, number 90/month.  New prescription sent today. controlled substance contract up-to-date.   2. Recurrent major depressive disorder.  Also GAD. Continue citalopram 40 mg a day and alprazolam 1 mg twice daily. She struggles with adjustment to chronic home stress from her relationship with her son."  INTERIM HX: [place] is doing well. Mood stable, anxiety stable.   Chronic back pain well-controlled with current pain regimen. Indication for chronic opioid: neck, mid back, low back, HA's, myofascial pain, arthralgias of appendicular skeleton.  Some days finds it hard to get out of bed and function.   She carries dx of DDD of C and L spine, significant scoliosis, myofascial pain syndrome with trigger points and recurrent trochanteric bursitis.  Has seen a handful of specialists in the past (sp[facility] and [place] [facility] in [place], [place] [facility], neurolog[place], in [place], also [facility] and wellness center in [place]).  She has never been managed by a pain mgmt specialist.   Medication and dose: vicodin 5/325, 1 tid prn, #90 per mo. PMP AWARE reviewed today: most recent rx for Vicodin 5/325 was filled 07/10/2022, # 90, rx by me. Most recent alprazolam 1 mg prescription filled 04/28/2022, #180, prescription by me. No red flags.   Past Medical History:  Diagnosis Date   Adult ADHD    Anxiety    Aortic atherosclerosis ([facility]) 08/2016   Chronic back pain    neck, too.  MRI L spine 05/2008: 40 degrees dextroscoliosis due to a hemivertebra of L3.  Some DDD/spondylosis w/out spinal stenosis.   Colon cancer  screening    04/2021 Cologuard negative.   Complication of anesthesia    drop in BP, difficult to wake up, shallow breathing   Depression    Diastolic dysfunction, left ventricle 05/2018   Echo with grd I DD.   GERD (gastroesophageal reflux disease)    Gout    2 attacks total as of 12/2014.   Heart murmur    Kidney stones 2005; 2017; 2018   Most recent CT 08/2016 showed 3 mm nonobstructing L nephrolithiasis.   Migraine    Proph tx with topamax helps, abortive tx with relpax helps.  Couldn't afford topamax so d/c'd this 04/2016.     Navicular fracture, foot 03/2018   Right Navicular fx-nonunion (surgery)   Pancreatic lesion    10/2021 6mm cystic lesion, stable 03/2022. Rpt 1 yr   Pulmonary nodule 2018   5 mm LLL, stable on 2 yr f/u imaging 2022-->benign, no further imaging needed.   Renal cancer, left ([facility])    partial nephrectomy 06/07/21   Restless legs syndrome    Pt can get to sleep fine so she declines med for this as of 08/2016    Past Surgical History:  Procedure Laterality Date   BREAST SURGERY  1999   reduction   ENDOMETRIAL ABLATION  2002   FOOT FRACTURE SURGERY  03/2018   Closed displaced fracture of right navicular, nonunion.   KIDNEY STONE SURGERY  2005   LAPAROSCOPIC VAGINAL HYSTERECTOMY  12/02/2007   for DUB   LUMBAR ESI  2021   no  help ([facility])   REDUCTION MAMMAPLASTY     ROBOTIC ASSITED PARTIAL NEPHRECTOMY Left 06/07/2021   Procedure: XI ROBOTIC ASSITED PARTIAL NEPHRECTOMY;  Surgeon: Rene Paci, MD;  Location: [facility];  Service: Urology;  Laterality: Left;   TRANSTHORACIC ECHOCARDIOGRAM  06/13/2018   Grd I DD   WISDOM TOOTH EXTRACTION      Outpatient Medications Prior to Visit  Medication Sig Dispense Refill   albuterol (VENTOLIN HFA) 108 (90 Base) MCG/ACT inhaler Inhale 2 puffs into the lungs every 4 (four) hours as needed for wheezing or shortness of breath. 18 g 0   Cyanocobalamin (VITAMIN B-12 PO) Take 1 tablet by mouth daily. Reported on  04/07/2015     eletriptan (RELPAX) 40 MG tablet TAKE 1 TABLET BY MOUTH AT ONSET OF HEADACHE MAY REPEAT IN 2 HOURS IF HEADACHE PERSISTS OR RECURS. 18 tablet 2   fluconazole (DIFLUCAN) 150 MG tablet 1 tab p.o. daily x 2 days 2 tablet 0   fluorometholone (FML) 0.1 % ophthalmic suspension SMARTSIG:In Eye(s)     fluorouracil (EFUDEX) 5 % cream Apply to areas of sun damage on the face twice daily for 4-5 days 40 g 6   tretinoin (RETIN-A) 0.025 % cream Apply 1 application topically at bedtime as needed. For acne prevention/control 45 g 3   ALPRAZolam (XANAX) 1 MG tablet TAKE 1 TABLET BY MOUTH TWICE A DAY AS NEEDED ANXIETY 180 tablet 1   citalopram (CELEXA) 40 MG tablet TAKE ONE TABLET (40MG  TOTAL) BY MOUTH DAILY 16 tablet 0   HYDROcodone-acetaminophen (NORCO/VICODIN) 5-325 MG tablet TAKE ONE TABLET BY MOUTH THREE TIMES DAILY AS NEEDED 90 tablet 0   pantoprazole (PROTONIX) 40 MG tablet TAKE ONE (1) TABLET BY MOUTH EVERY DAY 90 tablet 3   colchicine 0.6 MG tablet 2 tabs po at onset of gout pain, then 1 tab po one hour later (Patient not taking: Reported on 04/10/2022) 15 tablet 2   ibuprofen (ADVIL) 600 MG tablet TAKE ONE TABLET (600MG  TOTAL) BY MOUTH EVERY 8 HOURS AS NEEDED (Patient not taking: Reported on 04/10/2022) 30 tablet 3   lidocaine (LIDODERM) 5 % 1 patch to the affected area q12h prn (Patient not taking: Reported on 04/10/2022) 90 patch 3   methocarbamol (ROBAXIN) 500 MG tablet TAKE 1 OR 2 TABLETS BY MOUTH EVERY 6 HOURS AS NEEDED FOR MUSCULAR RELAXATION (Patient not taking: Reported on 04/10/2022) 30 tablet 6   tiZANidine (ZANAFLEX) 4 MG tablet 1 tab po qhs prn (Patient not taking: Reported on 04/10/2022) 90 tablet 1   No facility-administered medications prior to visit.    Allergies  Allergen Reactions   Imitrex [Sumatriptan] Other (See Comments)    Face and Arm numbness    Review of Systems As per HPI  PE:    08/09/2022   10:05 AM 04/10/2022    2:58 PM 03/29/2022   11:04 AM  Vitals with  BMI  Height  5\' 3"    Weight 147 lbs 10 oz 150 lbs 3 oz 148 lbs  BMI  26.61   Systolic 104 104   Diastolic 73 70   Pulse 66 58      Physical Exam  Gen: Alert, well appearing.  Patient is oriented to person, place, time, and situation. No further exam today  LABS:  Last CBC Lab Results  Component Value Date   WBC 5.0 04/10/2022   HGB 13.4 04/10/2022   HCT 40.2 04/10/2022   MCV 89.5 04/10/2022   MCH 30.6 06/08/2021   RDW 13.1  04/10/2022   PLT 210.0 04/10/2022   Last metabolic panel Lab Results  Component Value Date   GLUCOSE 81 04/10/2022   NA 138 04/10/2022   K 4.6 04/10/2022   CL 101 04/10/2022   CO2 32 04/10/2022   BUN 8 04/10/2022   CREATININE 0.83 04/10/2022   GFR 76.96 04/10/2022   CALCIUM 9.7 04/10/2022   PROT 6.6 04/10/2022   ALBUMIN 4.4 04/10/2022   BILITOT 0.4 04/10/2022   ALKPHOS 64 04/10/2022   AST 20 04/10/2022   ALT 12 04/10/2022   ANIONGAP 6 06/08/2021   Last lipids Lab Results  Component Value Date   CHOL 194 04/10/2022   HDL 52.40 04/10/2022   LDLCALC 115 (H) 04/10/2022   TRIG 132.0 04/10/2022   CHOLHDL 4 04/10/2022   Last thyroid functions Lab Results  Component Value Date   TSH 0.71 04/10/2022   Last vitamin D Lab Results  Component Value Date   VD25OH 37 09/08/2011   Last vitamin B12 and Folate Lab Results  Component Value Date   VITAMINB12 1,292 (H) 09/08/2011   IMPRESSION AND PLAN:  #1 chronic pain syndrome. scoliosis and multilevel spondylosis.  Myofascial pain syndrome. We will continue with Vicodin 5-3 25, 1 3 times daily as needed, number 90/month.  New prescription sent today. controlled substance contract up-to-date. Urine drug screen today.  2.  Recurrent major depressive disorder, chronic anxiety. Well-controlled on citalopram 40 mg a day--refill today. Alprazolam 1 mg twice daily prn, #180, refill x 1.  An After Visit Summary was printed and given to the patient.  FOLLOW UP: Return in about 3 months  (around 11/09/2022) for routine chronic illness f/u. Next CPE 03/2023 Signed:  Santiago Bumpers, MD           08/09/2022

## 2022-09-01 ENCOUNTER — Other Ambulatory Visit: Payer: Self-pay | Admitting: Family Medicine

## 2022-09-01 MED ORDER — FLUCONAZOLE 150 MG PO TABS: ORAL_TABLET | ORAL | 0 refills | Status: DC

## 2022-09-01 NOTE — Telephone Encounter (Signed)
Patient called back to speak to Dr. Samul Dada assistants.  I told them she was in patient care with provider.  Patient in [place] and her friend in [place] is leaving tomorrow to meet patient.  She has yeast infection and thrush in her mouth.  Please call patient [phone]

## 2022-09-01 NOTE — Telephone Encounter (Signed)
Diflucan sent to her pharmacy in [place].

## 2022-09-01 NOTE — Telephone Encounter (Signed)
Pt made aware

## 2022-09-08 ENCOUNTER — Other Ambulatory Visit: Payer: Self-pay | Admitting: Family Medicine

## 2022-09-08 MED ORDER — HYDROCODONE-ACETAMINOPHEN 5-325 MG PO TABS: 1.0000 | ORAL_TABLET | Freq: Three times a day (TID) | ORAL | 0 refills | Status: DC | PRN

## 2022-09-15 ENCOUNTER — Other Ambulatory Visit: Payer: Self-pay | Admitting: Family Medicine

## 2022-10-10 ENCOUNTER — Other Ambulatory Visit: Payer: Self-pay | Admitting: Family Medicine

## 2022-10-10 NOTE — Telephone Encounter (Signed)
Refill requested for Hydrocodone sent to [facility] pharmacy. Next OV 10/24

## 2022-10-11 ENCOUNTER — Other Ambulatory Visit: Payer: Self-pay | Admitting: Family Medicine

## 2022-10-11 DIAGNOSIS — Z1231 Encounter for screening mammogram for malignant neoplasm of breast: Secondary | ICD-10-CM

## 2022-10-18 DIAGNOSIS — Z01419 Encounter for gynecological examination (general) (routine) without abnormal findings: Secondary | ICD-10-CM | POA: Diagnosis not present

## 2022-10-18 DIAGNOSIS — Z6825 Body mass index (BMI) 25.0-25.9, adult: Secondary | ICD-10-CM | POA: Diagnosis not present

## 2022-10-22 ENCOUNTER — Other Ambulatory Visit: Payer: Self-pay | Admitting: Family Medicine

## 2022-11-08 ENCOUNTER — Telehealth: Payer: Self-pay | Admitting: Family Medicine

## 2022-11-08 MED ORDER — LIDOCAINE 5 % EX PTCH: MEDICATED_PATCH | CUTANEOUS | 0 refills | Status: DC

## 2022-11-08 NOTE — Telephone Encounter (Signed)
Patient's husband called to inquire about a Xanax([facility]) refill for his wife. She needed to reschedule her appointment to 10/31 due to back pain flare up. Denied appointment for the back pain. The next request is for Lidocaine patches for her back pain ([facility])  Please give the patient a call for any clarification.

## 2022-11-09 ENCOUNTER — Ambulatory Visit: Payer: Medicare Other | Admitting: Family Medicine

## 2022-11-09 MED ORDER — HYDROCODONE-ACETAMINOPHEN 5-325 MG PO TABS: 1.0000 | ORAL_TABLET | Freq: Three times a day (TID) | ORAL | 0 refills | Status: DC | PRN

## 2022-11-09 NOTE — Addendum Note (Signed)
Addended by: Jeoffrey Massed on: 11/09/2022 08:20 AM   Modules accepted: Orders

## 2022-11-15 ENCOUNTER — Ambulatory Visit: Payer: Medicare Other

## 2022-11-15 NOTE — Telephone Encounter (Signed)
PA denied for lidocaine patch

## 2022-11-16 ENCOUNTER — Encounter: Payer: Self-pay | Admitting: Family Medicine

## 2022-11-16 ENCOUNTER — Ambulatory Visit (INDEPENDENT_AMBULATORY_CARE_PROVIDER_SITE_OTHER): Payer: Medicare Other | Admitting: Family Medicine

## 2022-11-16 ENCOUNTER — Telehealth: Payer: Self-pay | Admitting: Family Medicine

## 2022-11-16 VITALS — BP 98/68 | HR 65 | Wt 147.2 lb

## 2022-11-16 DIAGNOSIS — F411 Generalized anxiety disorder: Secondary | ICD-10-CM

## 2022-11-16 DIAGNOSIS — Z23 Encounter for immunization: Secondary | ICD-10-CM | POA: Diagnosis not present

## 2022-11-16 DIAGNOSIS — G894 Chronic pain syndrome: Secondary | ICD-10-CM | POA: Diagnosis not present

## 2022-11-16 DIAGNOSIS — M47816 Spondylosis without myelopathy or radiculopathy, lumbar region: Secondary | ICD-10-CM

## 2022-11-16 DIAGNOSIS — F3342 Major depressive disorder, recurrent, in full remission: Secondary | ICD-10-CM

## 2022-11-16 DIAGNOSIS — M7918 Myalgia, other site: Secondary | ICD-10-CM

## 2022-11-16 MED ORDER — METHOCARBAMOL 500 MG PO TABS: ORAL_TABLET | ORAL | 6 refills | Status: DC

## 2022-11-16 NOTE — Telephone Encounter (Signed)
Please advise if patient is ok to use or if you can prescribe one.

## 2022-11-16 NOTE — Progress Notes (Signed)
OFFICE VISIT  11/16/2022  CC:  Chief Complaint  Patient presents with   Medical Management of Chronic Issues    Patient is a 62 y.o. female who presents for 47-month follow-up chronic pain syndrome and generalized anxiety/recurrent MDD. A/P as of last visit: "1 chronic pain syndrome. scoliosis and multilevel spondylosis.  Myofascial pain syndrome. We will continue with Vicodin 5-3 25, 1 3 times daily as needed, number 90/month.  New prescription sent today. controlled substance contract up-to-date. Urine drug screen today.   2.  Recurrent major depressive disorder, chronic anxiety. Well-controlled on citalopram 40 mg a day--refill today. Alprazolam 1 mg twice daily prn, #180, refill x 1."  INTERIM HX: Daneah is doing really well regarding anxiety and depression.  Back pain is relatively stable.  She did have a flareup when she took a long drive to [place] recently. Indication for chronic opioid: neck, mid back, low back, HA's, myofascial pain, arthralgias of appendicular skeleton.  Some days finds it hard to get out of bed and function.   She carries dx of DDD of C and L spine, significant scoliosis, myofascial pain syndrome with trigger points and recurrent trochanteric bursitis.  Has seen a handful of specialists in the past ([facility] [place] [facility], [place], [place], also [facility] in [place]  She has never been managed by a pain mgmt specialist.  PMP AWARE reviewed today: most recent rx for Vicodin 5/325 was filled 11/10/2022, # 90, rx by me.  Most recent alprazolam 1 mg prescription was filled 11/08/2022, #180, prescription by me. No red flags.  Past Medical History:  Diagnosis Date   Adult ADHD    Anxiety    Aortic atherosclerosis (HCC) 08/2016   Chronic back pain    neck, too.  MRI L spine 05/2008: 40 degrees dextroscoliosis due to a hemivertebra of L3.  Some DDD/spondylosis w/out spinal stenosis.   Colon cancer screening    04/2021  Cologuard negative.   Complication of anesthesia    drop in BP, difficult to wake up, shallow breathing   Depression    Diastolic dysfunction, left ventricle 05/2018   Echo with grd I DD.   GERD (gastroesophageal reflux disease)    Gout    2 attacks total as of 12/2014.   Heart murmur    Kidney stones 2005; 2017; 2018   Most recent CT 08/2016 showed 3 mm nonobstructing L nephrolithiasis.   Migraine    Proph tx with topamax helps, abortive tx with relpax helps.  Couldn't afford topamax so d/c'd this 04/2016.     Navicular fracture, foot 03/2018   Right Navicular fx-nonunion (surgery)   Pancreatic lesion    10/2021 6mm cystic lesion, stable 03/2022. Rpt 1 yr   Pulmonary nodule 2018   5 mm LLL, stable on 2 yr f/u imaging 2022-->benign, no further imaging needed.   Renal cancer, left (HCC)    partial nephrectomy 06/07/21   Restless legs syndrome    Pt can get to sleep fine so she declines med for this as of 08/2016    Past Surgical History:  Procedure Laterality Date   BREAST SURGERY  1999   reduction   ENDOMETRIAL ABLATION  2002   FOOT FRACTURE SURGERY  03/2018   Closed displaced fracture of right navicular, nonunion.   KIDNEY STONE SURGERY  2005   LAPAROSCOPIC VAGINAL HYSTERECTOMY  12/02/2007   for DUB   LUMBAR ESI  2021   no help ([facility])   REDUCTION  MAMMAPLASTY     ROBOTIC ASSITED PARTIAL NEPHRECTOMY Left 06/07/2021   Procedure: XI ROBOTIC ASSITED PARTIAL NEPHRECTOMY;  Surgeon: Rene Paci, MD;  Location: [facility];  Service: Urology;  Laterality: Left;   TRANSTHORACIC ECHOCARDIOGRAM  06/13/2018   Grd I DD   WISDOM TOOTH EXTRACTION      Outpatient Medications Prior to Visit  Medication Sig Dispense Refill   albuterol (VENTOLIN HFA) 108 (90 Base) MCG/ACT inhaler Inhale 2 puffs into the lungs every 4 (four) hours as needed for wheezing or shortness of breath. 18 g 0   ALPRAZolam (XANAX) 1 MG tablet TAKE 1 TABLET BY MOUTH TWICE A DAY AS NEEDED ANXIETY 180 tablet 1    citalopram (CELEXA) 40 MG tablet 1 tab po qd 90 tablet 3   Cyanocobalamin (VITAMIN B-12 PO) Take 1 tablet by mouth daily. Reported on 04/07/2015     eletriptan (RELPAX) 40 MG tablet TAKE 1 TABLET BY MOUTH AT ONSET OF HEADACHE. MAY REPEAT IN 2 HOURS IF HEADACHE PERSISTS OR RECURS 18 tablet 0   fluconazole (DIFLUCAN) 150 MG tablet 1 tab p.o. daily x 10 days 10 tablet 0   fluorometholone (FML) 0.1 % ophthalmic suspension SMARTSIG:In Eye(s)     fluorouracil (EFUDEX) 5 % cream Apply to areas of sun damage on the face twice daily for 4-5 days 40 g 6   HYDROcodone-acetaminophen (NORCO/VICODIN) 5-325 MG tablet Take 1 tablet by mouth 3 (three) times daily as needed. 90 tablet 0   lidocaine (LIDODERM) 5 % 1 patch to the affected area q12h prn 30 patch 0   tretinoin (RETIN-A) 0.025 % cream Apply 1 application topically at bedtime as needed. For acne prevention/control 45 g 3   ibuprofen (ADVIL) 600 MG tablet TAKE ONE TABLET (600MG  TOTAL) BY MOUTH EVERY 8 HOURS AS NEEDED (Patient not taking: Reported on 04/10/2022) 30 tablet 3   pantoprazole (PROTONIX) 40 MG tablet TAKE ONE (1) TABLET BY MOUTH EVERY DAY (Patient not taking: Reported on 11/16/2022) 90 tablet 3   tiZANidine (ZANAFLEX) 4 MG tablet 1 tab po qhs prn (Patient not taking: Reported on 04/10/2022) 90 tablet 1   colchicine 0.6 MG tablet 2 tabs po at onset of gout pain, then 1 tab po one hour later (Patient not taking: Reported on 04/10/2022) 15 tablet 2   methocarbamol (ROBAXIN) 500 MG tablet TAKE 1 OR 2 TABLETS BY MOUTH EVERY 6 HOURS AS NEEDED FOR MUSCULAR RELAXATION (Patient not taking: Reported on 04/10/2022) 30 tablet 6   No facility-administered medications prior to visit.    Allergies  Allergen Reactions   Imitrex [Sumatriptan] Other (See Comments)    Face and Arm numbness    Review of Systems As per HPI  PE:    11/16/2022    1:57 PM 08/09/2022   10:05 AM 04/10/2022    2:58 PM  Vitals with BMI  Height   5\' 3"   Weight 147 lbs 3 oz 147 lbs  10 oz 150 lbs 3 oz  BMI   26.61  Systolic 98 104 104  Diastolic 68 73 70  Pulse 65 66 58     Physical Exam  Gen: Alert, well appearing.  Patient is oriented to person, place, time, and situation. AFFECT: pleasant, lucid thought and speech. No further exam today  LABS:  Last CBC Lab Results  Component Value Date   WBC 5.0 04/10/2022   HGB 13.4 04/10/2022   HCT 40.2 04/10/2022   MCV 89.5 04/10/2022   MCH 30.6 06/08/2021   RDW  13.1 04/10/2022   PLT 210.0 04/10/2022   Last metabolic panel Lab Results  Component Value Date   GLUCOSE 81 04/10/2022   NA 138 04/10/2022   K 4.6 04/10/2022   CL 101 04/10/2022   CO2 32 04/10/2022   BUN 8 04/10/2022   CREATININE 0.83 04/10/2022   GFR 76.96 04/10/2022   CALCIUM 9.7 04/10/2022   PROT 6.6 04/10/2022   ALBUMIN 4.4 04/10/2022   BILITOT 0.4 04/10/2022   ALKPHOS 64 04/10/2022   AST 20 04/10/2022   ALT 12 04/10/2022   ANIONGAP 6 06/08/2021   Last lipids Lab Results  Component Value Date   CHOL 194 04/10/2022   HDL 52.40 04/10/2022   LDLCALC 115 (H) 04/10/2022   TRIG 132.0 04/10/2022   CHOLHDL 4 04/10/2022   Last thyroid functions Lab Results  Component Value Date   TSH 0.71 04/10/2022   IMPRESSION AND PLAN:  1 chronic pain syndrome. scoliosis and multilevel spondylosis.  Myofascial pain syndrome. We will continue with Vicodin 5-3 25, 1 3 times daily as needed, number 90/month. Continue Robaxin 500 mg, 1-2 every 6 hours as needed. Controlled substance contract up-to-date. UDS UTD.   2.  Recurrent major depressive disorder, chronic anxiety. Well-controlled on citalopram 40 mg a day.. Alprazolam 1 mg twice daily.  An After Visit Summary was printed and given to the patient.  FOLLOW UP: Return in about 3 months (around 02/16/2023) for routine chronic illness f/u. Next CPE 03/2023 Signed:  Santiago Bumpers, MD           11/16/2022

## 2022-11-16 NOTE — Telephone Encounter (Signed)
Jacqueline Vance was seen today, however she forgot to inquire about a Tens unit. She is looking for advice on rather Dr. Milinda Cave prescribes these. Please give the patient a call to advise.

## 2022-11-24 ENCOUNTER — Other Ambulatory Visit: Payer: Self-pay | Admitting: Family Medicine

## 2022-11-24 ENCOUNTER — Inpatient Hospital Stay: Admission: RE | Admit: 2022-11-24 | Payer: Medicare Other | Source: Ambulatory Visit

## 2022-12-06 DIAGNOSIS — C642 Malignant neoplasm of left kidney, except renal pelvis: Secondary | ICD-10-CM | POA: Diagnosis not present

## 2022-12-13 ENCOUNTER — Other Ambulatory Visit: Payer: Self-pay | Admitting: Family Medicine

## 2022-12-18 ENCOUNTER — Telehealth: Payer: Self-pay | Admitting: Family Medicine

## 2022-12-18 ENCOUNTER — Ambulatory Visit: Payer: Medicare Other

## 2022-12-18 NOTE — Telephone Encounter (Signed)
Medication Refill sent to PCP.

## 2022-12-18 NOTE — Telephone Encounter (Signed)
Prescription Request  12/18/2022  LOV: 11/16/2022  HYDROcodone-acetaminophen (NORCO/VICODIN) 5-325 MG tablet   What is the name of the medication or equipment?   Have you contacted your pharmacy to request a refill? Yes  [facility] is the pharmacy of choice.  Which pharmacy would you like this sent to?  [facility] - [place], [place] - [address] [place] [address] Phone: [phone] Fax: [phone]  [facility] [address] [place] - [address] [place] [address] Phone: [phone] Fax: [phone]  [facility] [address], [place] - [address] [place] [address] [place] [address] [place] [address] Phone: [phone] Fax: [phone]    Patient notified that their request is being sent to the clinical staff for review and that they should receive a response within 2 business days.   Please advise at Mobile [phone] (mobile)

## 2022-12-18 NOTE — Addendum Note (Signed)
Addended by: Arty Baumgartner A on: 12/18/2022 10:19 AM   Modules accepted: Orders

## 2023-01-01 DIAGNOSIS — M9901 Segmental and somatic dysfunction of cervical region: Secondary | ICD-10-CM | POA: Diagnosis not present

## 2023-01-01 DIAGNOSIS — S134XXA Sprain of ligaments of cervical spine, initial encounter: Secondary | ICD-10-CM | POA: Diagnosis not present

## 2023-01-01 DIAGNOSIS — M9902 Segmental and somatic dysfunction of thoracic region: Secondary | ICD-10-CM | POA: Diagnosis not present

## 2023-01-01 DIAGNOSIS — M9903 Segmental and somatic dysfunction of lumbar region: Secondary | ICD-10-CM | POA: Diagnosis not present

## 2023-01-04 DIAGNOSIS — M9901 Segmental and somatic dysfunction of cervical region: Secondary | ICD-10-CM | POA: Diagnosis not present

## 2023-01-04 DIAGNOSIS — M9902 Segmental and somatic dysfunction of thoracic region: Secondary | ICD-10-CM | POA: Diagnosis not present

## 2023-01-04 DIAGNOSIS — S134XXA Sprain of ligaments of cervical spine, initial encounter: Secondary | ICD-10-CM | POA: Diagnosis not present

## 2023-01-04 DIAGNOSIS — M9903 Segmental and somatic dysfunction of lumbar region: Secondary | ICD-10-CM | POA: Diagnosis not present

## 2023-01-18 DIAGNOSIS — M9902 Segmental and somatic dysfunction of thoracic region: Secondary | ICD-10-CM | POA: Diagnosis not present

## 2023-01-18 DIAGNOSIS — M9901 Segmental and somatic dysfunction of cervical region: Secondary | ICD-10-CM | POA: Diagnosis not present

## 2023-01-18 DIAGNOSIS — S134XXA Sprain of ligaments of cervical spine, initial encounter: Secondary | ICD-10-CM | POA: Diagnosis not present

## 2023-01-18 DIAGNOSIS — M9903 Segmental and somatic dysfunction of lumbar region: Secondary | ICD-10-CM | POA: Diagnosis not present

## 2023-01-25 ENCOUNTER — Other Ambulatory Visit: Payer: Self-pay | Admitting: Family Medicine

## 2023-02-08 ENCOUNTER — Other Ambulatory Visit: Payer: Self-pay | Admitting: Family Medicine

## 2023-02-21 DIAGNOSIS — H2513 Age-related nuclear cataract, bilateral: Secondary | ICD-10-CM | POA: Diagnosis not present

## 2023-02-21 DIAGNOSIS — H16143 Punctate keratitis, bilateral: Secondary | ICD-10-CM | POA: Diagnosis not present

## 2023-02-22 ENCOUNTER — Ambulatory Visit: Payer: Medicare Other | Admitting: Family Medicine

## 2023-02-22 ENCOUNTER — Encounter: Payer: Self-pay | Admitting: Family Medicine

## 2023-02-22 VITALS — BP 104/68 | HR 67 | Ht 63.0 in

## 2023-02-22 DIAGNOSIS — G894 Chronic pain syndrome: Secondary | ICD-10-CM

## 2023-02-22 DIAGNOSIS — M7918 Myalgia, other site: Secondary | ICD-10-CM | POA: Diagnosis not present

## 2023-02-22 DIAGNOSIS — F3342 Major depressive disorder, recurrent, in full remission: Secondary | ICD-10-CM

## 2023-02-22 DIAGNOSIS — Z79899 Other long term (current) drug therapy: Secondary | ICD-10-CM

## 2023-02-22 DIAGNOSIS — M47816 Spondylosis without myelopathy or radiculopathy, lumbar region: Secondary | ICD-10-CM | POA: Diagnosis not present

## 2023-02-22 DIAGNOSIS — F411 Generalized anxiety disorder: Secondary | ICD-10-CM

## 2023-02-22 DIAGNOSIS — M4125 Other idiopathic scoliosis, thoracolumbar region: Secondary | ICD-10-CM | POA: Diagnosis not present

## 2023-02-22 MED ORDER — ELETRIPTAN HYDROBROMIDE 40 MG PO TABS: ORAL_TABLET | ORAL | 1 refills | Status: DC

## 2023-02-22 MED ORDER — HYDROCODONE-ACETAMINOPHEN 5-325 MG PO TABS: 1.0000 | ORAL_TABLET | Freq: Three times a day (TID) | ORAL | 0 refills | Status: DC | PRN

## 2023-02-22 NOTE — Progress Notes (Signed)
 OFFICE VISIT  02/22/2023  CC:  Chief Complaint  Patient presents with   Medical Management of Chronic Issues    Pt is not fasting    Patient is a 63 y.o. female who presents accompanied by her husband for 31-month follow-up chronic pain syndrome, recurrent depression, and generalized anxiety disorder. A/P as of last visit: 1 chronic pain syndrome. scoliosis and multilevel spondylosis.  Myofascial pain syndrome. We will continue with Vicodin 5-3 25, 1 3 times daily as needed, number 90/month. Continue Robaxin  500 mg, 1-2 every 6 hours as needed. Controlled substance contract up-to-date. UDS UTD.   2.  Recurrent major depressive disorder, chronic anxiety. Well-controlled on citalopram  40 mg a day.. Alprazolam  1 mg twice daily.  INTERIM HX: Jacqueline Vance is doing well. Her son is living in [place]  still and is doing well.  She has had some problems with dry eyes lately.  She is on cyclosporine and fluoromethalone eyedrops now. Her gynecologist saw her and diagnosed her with postmenopausal vaginal atrophy.  He has prescribed a cream that she would be taking for a couple of weeks.  Pain control is stable on current regimen. Indication for chronic opioid: neck, mid back, low back, HA's, myofascial pain, arthralgias of appendicular skeleton.  Some days finds it hard to get out of bed and function.   She carries dx of DDD of C and L spine, significant scoliosis, myofascial pain syndrome with trigger points and recurrent trochanteric bursitis.  Has seen a handful of specialists in the past ([place], [place], [place], [place], also [facility] and wellness center in [place]  She has never been managed by a pain mgmt specialist.  PMP AWARE reviewed today: most recent rx for Vicodin was filled 01/26/2023, # 90, rx by me. No red flags.  Most recent alprazolam  prescription filled 02/09/2023, #180, prescription by me.  ROS as above, plus--> no fevers, no CP, no SOB, no wheezing, no  cough, no dizziness, no HAs, no rashes, no melena/hematochezia.  No polyuria or polydipsia.  No focal weakness, paresthesias, or tremors.   No dysuria or unusual/new urinary urgency or frequency.  No recent changes in lower legs. No n/v/d or abd pain.  No palpitations.    Past Medical History:  Diagnosis Date   Adult ADHD    Anxiety    Aortic atherosclerosis (HCC) 08/2016   Chronic back pain    neck, too.  MRI L spine 05/2008: 40 degrees dextroscoliosis due to a hemivertebra of L3.  Some DDD/spondylosis w/out spinal stenosis.   Colon cancer screening    04/2021 Cologuard negative.   Complication of anesthesia    drop in BP, difficult to wake up, shallow breathing   Depression    Diastolic dysfunction, left ventricle 05/2018   Echo with grd I DD.   GERD (gastroesophageal reflux disease)    Gout    2 attacks total as of 12/2014.   Heart murmur    Kidney stones 2005; 2017; 2018   Most recent CT 08/2016 showed 3 mm nonobstructing L nephrolithiasis.   Migraine    Proph tx with topamax  helps, abortive tx with relpax  helps.  Couldn't afford topamax  so d/c'd this 04/2016.     Navicular fracture, foot 03/2018   Right Navicular fx-nonunion (surgery)   Pancreatic lesion    10/2021 6mm cystic lesion, stable 03/2022. Rpt 1 yr   Pulmonary nodule 2018   5 mm LLL, stable on 2 yr f/u imaging 2022-->benign, no further imaging needed.  Renal cancer, left (HCC)    partial nephrectomy 06/07/21   Restless legs syndrome    Pt can get to sleep fine so she declines med for this as of 08/2016    Past Surgical History:  Procedure Laterality Date   BREAST SURGERY  1999   reduction   ENDOMETRIAL ABLATION  2002   FOOT FRACTURE SURGERY  03/2018   Closed displaced fracture of right navicular, nonunion.   KIDNEY STONE SURGERY  2005   LAPAROSCOPIC VAGINAL HYSTERECTOMY  12/02/2007   for DUB   LUMBAR ESI  2021   no help ([facility])   REDUCTION MAMMAPLASTY     ROBOTIC ASSITED PARTIAL NEPHRECTOMY Left 06/07/2021    Procedure: XI ROBOTIC ASSITED PARTIAL NEPHRECTOMY;  Surgeon: Devere Lonni Righter, MD;  Location: [facility];  Service: Urology;  Laterality: Left;   TRANSTHORACIC ECHOCARDIOGRAM  06/13/2018   Grd I DD   WISDOM TOOTH EXTRACTION      Outpatient Medications Prior to Visit  Medication Sig Dispense Refill   albuterol  (VENTOLIN  HFA) 108 (90 Base) MCG/ACT inhaler Inhale 2 puffs into the lungs every 4 (four) hours as needed for wheezing or shortness of breath. 18 g 0   ALPRAZolam  (XANAX ) 1 MG tablet TAKE ONE TABLET BY MOUTH TWICE A DAY AS NEEDED FOR ANXIETY 180 tablet 1   citalopram  (CELEXA ) 40 MG tablet 1 tab po qd 90 tablet 3   Cyanocobalamin (VITAMIN B-12 PO) Take 1 tablet by mouth daily. Reported on 04/07/2015     cycloSPORINE (CEQUA OP) Apply to eye in the morning and at bedtime.     fluconazole  (DIFLUCAN ) 150 MG tablet 1 tab p.o. daily x 10 days 10 tablet 0   fluorometholone (FML) 0.1 % ophthalmic suspension SMARTSIG:In Eye(s)     fluorouracil  (EFUDEX ) 5 % cream Apply to areas of sun damage on the face twice daily for 4-5 days 40 g 6   ibuprofen  (ADVIL ) 600 MG tablet TAKE ONE TABLET (600MG  TOTAL) BY MOUTH EVERY 8 HOURS AS NEEDED 30 tablet 3   lidocaine  (LIDODERM ) 5 % 1 patch to the affected area q12h prn 30 patch 0   methocarbamol  (ROBAXIN ) 500 MG tablet TAKE 1 OR 2 TABLETS BY MOUTH EVERY 6 HOURS AS NEEDED FOR MUSCULAR RELAXATION 30 tablet 6   pantoprazole  (PROTONIX ) 40 MG tablet TAKE ONE (1) TABLET BY MOUTH EVERY DAY 90 tablet 3   tiZANidine  (ZANAFLEX ) 4 MG tablet 1 tab po qhs prn 90 tablet 1   tretinoin  (RETIN-A ) 0.025 % cream Apply 1 application topically at bedtime as needed. For acne prevention/control 45 g 3   HYDROcodone -acetaminophen  (NORCO/VICODIN) 5-325 MG tablet TAKE ONE TABLET BY MOUTH THREE TIMES DAILY AS NEEDED 90 tablet 0   eletriptan  (RELPAX ) 40 MG tablet TAKE 1 TABLET BY MOUTH AT ONSET OF HEADACHE.MAY REPEAT IN 2 HOURS IF HEADACHE PERSISTS OR RECURS 18 tablet 0   No  facility-administered medications prior to visit.    Allergies  Allergen Reactions   Imitrex  [Sumatriptan ] Other (See Comments)    Face and Arm numbness    Review of Systems As per HPI  PE:    02/22/2023    9:16 AM 11/16/2022    1:57 PM 08/09/2022   10:05 AM  Vitals with BMI  Height 5' 3    Weight  147 lbs 3 oz 147 lbs 10 oz  Systolic 104 98 104  Diastolic 68 68 73  Pulse 67 65 66     Physical Exam  Gen: Alert, well appearing.  Patient is oriented to person, place, time, and situation. AFFECT: pleasant, lucid thought and speech. No further exam today  LABS:  Last CBC Lab Results  Component Value Date   WBC 5.0 04/10/2022   HGB 13.4 04/10/2022   HCT 40.2 04/10/2022   MCV 89.5 04/10/2022   MCH 30.6 06/08/2021   RDW 13.1 04/10/2022   PLT 210.0 04/10/2022   Last metabolic panel Lab Results  Component Value Date   GLUCOSE 81 04/10/2022   NA 138 04/10/2022   K 4.6 04/10/2022   CL 101 04/10/2022   CO2 32 04/10/2022   BUN 8 04/10/2022   CREATININE 0.83 04/10/2022   GFR 76.96 04/10/2022   CALCIUM  9.7 04/10/2022   PROT 6.6 04/10/2022   ALBUMIN 4.4 04/10/2022   BILITOT 0.4 04/10/2022   ALKPHOS 64 04/10/2022   AST 20 04/10/2022   ALT 12 04/10/2022   ANIONGAP 6 06/08/2021   Last lipids Lab Results  Component Value Date   CHOL 194 04/10/2022   HDL 52.40 04/10/2022   LDLCALC 115 (H) 04/10/2022   TRIG 132.0 04/10/2022   CHOLHDL 4 04/10/2022   Last thyroid  functions Lab Results  Component Value Date   TSH 0.71 04/10/2022   Last vitamin D  Lab Results  Component Value Date   VD25OH 37 09/08/2011   Last vitamin B12 and Folate Lab Results  Component Value Date   VITAMINB12 1,292 (H) 09/08/2011   IMPRESSION AND PLAN:  1 chronic pain syndrome. scoliosis and multilevel spondylosis.  Myofascial pain syndrome. We will continue with Vicodin 5-3 25, 1 3 times daily as needed, number 90/month. Continue Robaxin  500 mg, 1-2 every 6 hours as  needed. Controlled substance contract up-to-date. UDS UTD.   2.  Recurrent major depressive disorder, chronic anxiety. Well-controlled on citalopram  40 mg a day. Alprazolam  1 mg twice daily.  #3 dry eye syndrome. She is on cyclosporine and fluoromethalone eyedrops now. She has switched from contacts to spectacles.  An After Visit Summary was printed and given to the patient.  FOLLOW UP: Return in about 3 months (around 05/22/2023) for annual CPE (fasting). Next CPE after 03/2023 Signed:  Gerlene Hockey, MD           02/22/2023

## 2023-02-26 ENCOUNTER — Other Ambulatory Visit (HOSPITAL_COMMUNITY): Payer: Self-pay

## 2023-02-27 ENCOUNTER — Other Ambulatory Visit: Payer: Self-pay | Admitting: Urology

## 2023-02-27 DIAGNOSIS — C642 Malignant neoplasm of left kidney, except renal pelvis: Secondary | ICD-10-CM

## 2023-03-07 ENCOUNTER — Other Ambulatory Visit: Payer: Self-pay | Admitting: Family Medicine

## 2023-03-09 ENCOUNTER — Other Ambulatory Visit: Payer: Self-pay | Admitting: Family Medicine

## 2023-03-21 ENCOUNTER — Other Ambulatory Visit: Payer: Self-pay | Admitting: Family Medicine

## 2023-03-21 MED ORDER — ELETRIPTAN HYDROBROMIDE 40 MG PO TABS: ORAL_TABLET | ORAL | 1 refills | Status: DC

## 2023-03-21 NOTE — Telephone Encounter (Signed)
 Copied from CRM [id number]. Topic: Clinical - Medication Refill >> Mar 21, 2023 12:12 PM [place] A wrote: Most Recent Primary Care Visit:  Provider: Jeoffrey Massed  Department: [facility]  Visit Type: OFFICE VISIT  Date: 02/22/2023  Medication: eletriptan (RELPAX) 40 MG tablet  Has the patient contacted their pharmacy? No (Agent: If no, request that the patient contact the pharmacy for the refill. If patient does not wish to contact the pharmacy document the reason why and proceed with request.) (Agent: If yes, when and what did the pharmacy advise?)  Is this the correct pharmacy for this prescription? Yes, [facility] [address] [place], [place] [address] If no, delete pharmacy and type the correct one.  This is the patient's preferred pharmacy:  [facility] [address] [place] - [address] [place] [address] [place] [address] Phone: [phone] Fax: [phone]  [facility] [place], [place] - [address] [place] [address] Phone: [phone] Fax: [phone]   Has the prescription been filled recently? No  Is the patient out of the medication? Yes  Has the patient been seen for an appointment in the last year OR does the patient have an upcoming appointment? Yes  Can we respond through MyChart? Yes  Agent: Please be advised that Rx refills may take up to 3 business days. We ask that you follow-up with your pharmacy.

## 2023-03-23 ENCOUNTER — Other Ambulatory Visit: Payer: Self-pay | Admitting: Family Medicine

## 2023-04-04 ENCOUNTER — Ambulatory Visit (INDEPENDENT_AMBULATORY_CARE_PROVIDER_SITE_OTHER): Payer: Medicare Other | Admitting: *Deleted

## 2023-04-04 DIAGNOSIS — Z0000 Encounter for general adult medical examination without abnormal findings: Secondary | ICD-10-CM | POA: Diagnosis not present

## 2023-04-04 NOTE — Patient Instructions (Signed)
 Jacqueline Vance , Thank you for taking time to come for your Medicare Wellness Visit. I appreciate your ongoing commitment to your health goals. Please review the following plan we discussed and let me know if I can assist you in the future.   Screening recommendations/referrals: Colonoscopy: up to date Mammogram: Education provided Bone Density: schedule through OBGYN Recommended yearly ophthalmology/optometry visit for glaucoma screening and checkup Recommended yearly dental visit for hygiene and checkup  Vaccinations: Influenza vaccine: up to date Tdap vaccine: up to date Shingles vaccine: up to date    Advanced directives: Education provided    Preventive Care 40-64Years and Older, Female Preventive care refers to lifestyle choices and visits with your health care provider that can promote health and wellness. What does preventive care include? A yearly physical exam. This is also called an annual well check. Dental exams once or twice a year. Routine eye exams. Ask your health care provider how often you should have your eyes checked. Personal lifestyle choices, including: Daily care of your teeth and gums. Regular physical activity. Eating a healthy diet. Avoiding tobacco and drug use. Limiting alcohol use. Practicing safe sex. Taking low-dose aspirin every day. Taking vitamin and mineral supplements as recommended by your health care provider. What happens during an annual well check? The services and screenings done by your health care provider during your annual well check will depend on your age, overall health, lifestyle risk factors, and family history of disease. Counseling  Your health care provider may ask you questions about your: Alcohol use. Tobacco use. Drug use. Emotional well-being. Home and relationship well-being. Sexual activity. Eating habits. History of falls. Memory and ability to understand (cognition). Work and work Astronomer. Reproductive  health. Screening  You may have the following tests or measurements: Height, weight, and BMI. Blood pressure. Lipid and cholesterol levels. These may be checked every 5 years, or more frequently if you are over 72 years old. Skin check. Lung cancer screening. You may have this screening every year starting at age 69 if you have a 30-pack-year history of smoking and currently smoke or have quit within the past 15 years. Fecal occult blood test (FOBT) of the stool. You may have this test every year starting at age 31. Flexible sigmoidoscopy or colonoscopy. You may have a sigmoidoscopy every 5 years or a colonoscopy every 10 years starting at age 48. Hepatitis C blood test. Hepatitis B blood test. Sexually transmitted disease (STD) testing. Diabetes screening. This is done by checking your blood sugar (glucose) after you have not eaten for a while (fasting). You may have this done every 1-3 years. Bone density scan. This is done to screen for osteoporosis. You may have this done starting at age 70. Mammogram. This may be done every 1-2 years. Talk to your health care provider about how often you should have regular mammograms. Talk with your health care provider about your test results, treatment options, and if necessary, the need for more tests. Vaccines  Your health care provider may recommend certain vaccines, such as: Influenza vaccine. This is recommended every year. Tetanus, diphtheria, and acellular pertussis (Tdap, Td) vaccine. You may need a Td booster every 10 years. Zoster vaccine. You may need this after age 30. Pneumococcal 13-valent conjugate (PCV13) vaccine. One dose is recommended after age 18. Pneumococcal polysaccharide (PPSV23) vaccine. One dose is recommended after age 39. Talk to your health care provider about which screenings and vaccines you need and how often you need them. This information is  not intended to replace advice given to you by your health care provider.  Make sure you discuss any questions you have with your health care provider. Document Released: 01/29/2015 Document Revised: 09/22/2015 Document Reviewed: 11/03/2014 Elsevier Interactive Patient Education  2017 [employer].  Fall Prevention in the Home Falls can cause injuries. They can happen to people of all ages. There are many things you can do to make your home safe and to help prevent falls. What can I do on the outside of my home? Regularly fix the edges of walkways and driveways and fix any cracks. Remove anything that might make you trip as you walk through a door, such as a raised step or threshold. Trim any bushes or trees on the path to your home. Use bright outdoor lighting. Clear any walking paths of anything that might make someone trip, such as rocks or tools. Regularly check to see if handrails are loose or broken. Make sure that both sides of any steps have handrails. Any raised decks and porches should have guardrails on the edges. Have any leaves, snow, or ice cleared regularly. Use sand or salt on walking paths during winter. Clean up any spills in your garage right away. This includes oil or grease spills. What can I do in the bathroom? Use night lights. Install grab bars by the toilet and in the tub and shower. Do not use towel bars as grab bars. Use non-skid mats or decals in the tub or shower. If you need to sit down in the shower, use a plastic, non-slip stool. Keep the floor dry. Clean up any water that spills on the floor as soon as it happens. Remove soap buildup in the tub or shower regularly. Attach bath mats securely with double-sided non-slip rug tape. Do not have throw rugs and other things on the floor that can make you trip. What can I do in the bedroom? Use night lights. Make sure that you have a light by your bed that is easy to reach. Do not use any sheets or blankets that are too big for your bed. They should not hang down onto the floor. Have a  firm chair that has side arms. You can use this for support while you get dressed. Do not have throw rugs and other things on the floor that can make you trip. What can I do in the kitchen? Clean up any spills right away. Avoid walking on wet floors. Keep items that you use a lot in easy-to-reach places. If you need to reach something above you, use a strong step stool that has a grab bar. Keep electrical cords out of the way. Do not use floor polish or wax that makes floors slippery. If you must use wax, use non-skid floor wax. Do not have throw rugs and other things on the floor that can make you trip. What can I do with my stairs? Do not leave any items on the stairs. Make sure that there are handrails on both sides of the stairs and use them. Fix handrails that are broken or loose. Make sure that handrails are as long as the stairways. Check any carpeting to make sure that it is firmly attached to the stairs. Fix any carpet that is loose or worn. Avoid having throw rugs at the top or bottom of the stairs. If you do have throw rugs, attach them to the floor with carpet tape. Make sure that you have a light switch at the top of the  stairs and the bottom of the stairs. If you do not have them, ask someone to add them for you. What else can I do to help prevent falls? Wear shoes that: Do not have high heels. Have rubber bottoms. Are comfortable and fit you well. Are closed at the toe. Do not wear sandals. If you use a stepladder: Make sure that it is fully opened. Do not climb a closed stepladder. Make sure that both sides of the stepladder are locked into place. Ask someone to hold it for you, if possible. Clearly mark and make sure that you can see: Any grab bars or handrails. First and last steps. Where the edge of each step is. Use tools that help you move around (mobility aids) if they are needed. These include: Canes. Walkers. Scooters. Crutches. Turn on the lights when you  go into a dark area. Replace any light bulbs as soon as they burn out. Set up your furniture so you have a clear path. Avoid moving your furniture around. If any of your floors are uneven, fix them. If there are any pets around you, be aware of where they are. Review your medicines with your doctor. Some medicines can make you feel dizzy. This can increase your chance of falling. Ask your doctor what other things that you can do to help prevent falls. This information is not intended to replace advice given to you by your health care provider. Make sure you discuss any questions you have with your health care provider. Document Released: 10/29/2008 Document Revised: 06/10/2015 Document Reviewed: 02/06/2014 Elsevier Interactive Patient Education  2017 [employer].

## 2023-04-04 NOTE — Progress Notes (Signed)
 Subjective:   Jacqueline Vance is a 63 y.o. female who presents for Medicare Annual (Subsequent) preventive examination.  Visit Complete: Virtual I connected with  KIMBLERY DIOP on 04/04/23 by a audio enabled telemedicine application and verified that I am speaking with the correct person using two identifiers.  Patient Location: Home  Provider Location: Home Office  I discussed the limitations of evaluation and management by telemedicine. The patient expressed understanding and agreed to proceed.  Vital Signs: Because this visit was a virtual/telehealth visit, some criteria may be missing or patient reported. Any vitals not documented were not able to be obtained and vitals that have been documented are patient reported.  Cardiac Risk Factors include: advanced age (>22men, >105 women)     Objective:    There were no vitals filed for this visit. There is no height or weight on file to calculate BMI.     04/04/2023   11:34 AM 03/29/2022   11:11 AM 06/07/2021   12:00 PM 06/01/2021    9:14 AM 03/22/2021    2:40 PM 03/31/2020    3:04 PM 04/26/2019    3:36 PM  Advanced Directives  Does Patient Have a Medical Advance Directive? No No No No No No No  Would patient like information on creating a medical advance directive? No - Patient declined No - Patient declined No - Patient declined Yes (MAU/Ambulatory/Procedural Areas - Information given) No - Patient declined      Current Medications (verified) Outpatient Encounter Medications as of 04/04/2023  Medication Sig   albuterol (VENTOLIN HFA) 108 (90 Base) MCG/ACT inhaler Inhale 2 puffs into the lungs every 4 (four) hours as needed for wheezing or shortness of breath.   ALPRAZolam (XANAX) 1 MG tablet TAKE ONE TABLET BY MOUTH TWICE A DAY AS NEEDED FOR ANXIETY   citalopram (CELEXA) 40 MG tablet 1 tab po qd   Cyanocobalamin (VITAMIN B-12 PO) Take 1 tablet by mouth daily. Reported on 04/07/2015   cycloSPORINE (CEQUA OP) Apply to eye in the  morning and at bedtime.   eletriptan (RELPAX) 40 MG tablet TAKE 1 TABLET BY MOUTH AT ONSET OF HEADACHE MAY REPEAT IN 2 HOURS IF HEADACHE PERSISTS OR RECURS   fluconazole (DIFLUCAN) 150 MG tablet 1 tab p.o. daily x 10 days   fluorometholone (FML) 0.1 % ophthalmic suspension SMARTSIG:In Eye(s)   fluorouracil (EFUDEX) 5 % cream Apply to areas of sun damage on the face twice daily for 4-5 days   HYDROcodone-acetaminophen (NORCO/VICODIN) 5-325 MG tablet Take 1 tablet by mouth 3 (three) times daily as needed.   ibuprofen (ADVIL) 600 MG tablet TAKE ONE TABLET (600MG  TOTAL) BY MOUTH EVERY 8 HOURS AS NEEDED   lidocaine (LIDODERM) 5 % 1 patch to the affected area q12h prn   methocarbamol (ROBAXIN) 500 MG tablet TAKE 1 OR 2 TABLETS BY MOUTH EVERY 6 HOURS AS NEEDED FOR MUSCULAR RELAXATION   pantoprazole (PROTONIX) 40 MG tablet TAKE ONE (1) TABLET BY MOUTH EVERY DAY   tiZANidine (ZANAFLEX) 4 MG tablet 1 tab po qhs prn   tretinoin (RETIN-A) 0.025 % cream Apply 1 application topically at bedtime as needed. For acne prevention/control   No facility-administered encounter medications on file as of 04/04/2023.    Allergies (verified) Imitrex [sumatriptan]   History: Past Medical History:  Diagnosis Date   Adult ADHD    Anxiety    Aortic atherosclerosis (HCC) 08/2016   Chronic back pain    neck, too.  MRI L spine 05/2008: 40  degrees dextroscoliosis due to a hemivertebra of L3.  Some DDD/spondylosis w/out spinal stenosis.   Colon cancer screening    04/2021 Cologuard negative.   Complication of anesthesia    drop in BP, difficult to wake up, shallow breathing   Depression    Diastolic dysfunction, left ventricle 05/2018   Echo with grd I DD.   Dry eye syndrome    GERD (gastroesophageal reflux disease)    Gout    2 attacks total as of 12/2014.   Heart murmur    Kidney stones 2005; 2017; 2018   Most recent CT 08/2016 showed 3 mm nonobstructing L nephrolithiasis.   Migraine    Proph tx with topamax  helps, abortive tx with relpax helps.  Couldn't afford topamax so d/c'd this 04/2016.     Navicular fracture, foot 03/2018   Right Navicular fx-nonunion (surgery)   Pancreatic lesion    10/2021 6mm cystic lesion, stable 03/2022. Rpt 1 yr   Pulmonary nodule 2018   5 mm LLL, stable on 2 yr f/u imaging 2022-->benign, no further imaging needed.   Renal cancer, left (HCC)    partial nephrectomy 06/07/21   Restless legs syndrome    Pt can get to sleep fine so she declines med for this as of 08/2016   Past Surgical History:  Procedure Laterality Date   BREAST SURGERY  1999   reduction   ENDOMETRIAL ABLATION  2002   FOOT FRACTURE SURGERY  03/2018   Closed displaced fracture of right navicular, nonunion.   KIDNEY STONE SURGERY  2005   LAPAROSCOPIC VAGINAL HYSTERECTOMY  12/02/2007   for DUB   LUMBAR ESI  2021   no help ([facility])   REDUCTION MAMMAPLASTY     ROBOTIC ASSITED PARTIAL NEPHRECTOMY Left 06/07/2021   Procedure: XI ROBOTIC ASSITED PARTIAL NEPHRECTOMY;  Surgeon: Rene Paci, MD;  Location: [facility];  Service: Urology;  Laterality: Left;   TRANSTHORACIC ECHOCARDIOGRAM  06/13/2018   Grd I DD   WISDOM TOOTH EXTRACTION     Family History  Problem Relation Age of Onset   Cancer Mother        brain ca (glioblastoma)   Breast cancer Paternal Aunt    Breast cancer Maternal Grandmother    Social History   Socioeconomic History   Marital status: Married    Spouse name: Not on file   Number of children: Not on file   Years of education: Not on file   Highest education level: Not on file  Occupational History   Not on file  Tobacco Use   Smoking status: Never   Smokeless tobacco: Never  Vaping Use   Vaping status: Never Used  Substance and Sexual Activity   Alcohol use: No   Drug use: No   Sexual activity: Not Currently  Other Topics Concern   Not on file  Social History Narrative   Married, one adult son.  Lives in [place], [place].   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    Exercise: walks 3 miles per day.   No T/A/Ds.               Social Drivers of Corporate investment banker Strain: Low Risk  (04/04/2023)   Overall Financial Resource Strain (CARDIA)    Difficulty of Paying Living Expenses: Not hard at all  Food Insecurity: No Food Insecurity (04/04/2023)   Hunger Vital Sign    Worried About Running Out of Food in the Last Year: Never true  Ran Out of Food in the Last Year: Never true  Transportation Needs: No Transportation Needs (04/04/2023)   PRAPARE - Administrator, Civil Service (Medical): No    Lack of Transportation (Non-Medical): No  Physical Activity: Sufficiently Active (04/04/2023)   Exercise Vital Sign    Days of Exercise per Week: 4 days    Minutes of Exercise per Session: 40 min  Recent Concern: Physical Activity - Inactive (04/04/2023)   Exercise Vital Sign    Days of Exercise per Week: 0 days    Minutes of Exercise per Session: 0 min  Stress: No Stress Concern Present (04/04/2023)   [employer] of Occupational Health - Occupational Stress Questionnaire    Feeling of Stress : Not at all  Social Connections: Socially Integrated (04/04/2023)   Social Connection and Isolation Panel [NHANES]    Frequency of Communication with Friends and Family: More than three times a week    Frequency of Social Gatherings with Friends and Family: More than three times a week    Attends Religious Services: More than 4 times per year    Active Member of [employer] or Organizations: Yes    Attends Banker Meetings: 1 to 4 times per year    Marital Status: Married    Tobacco Counseling Counseling given: Not Answered   Clinical Intake:  Pre-visit preparation completed: Yes  Pain : 0-10 Pain Type: Chronic pain Pain Location: Back Pain Descriptors / Indicators: Constant, Burning, Aching, Grimacing Pain Onset: More than a month ago Pain Frequency: Constant     Diabetes: No  How  often do you need to have someone help you when you read instructions, pamphlets, or other written materials from your doctor or pharmacy?: 1 - Never  Interpreter Needed?: No  Information entered by :: Remi Haggard LPN   Activities of Daily Living    04/04/2023   11:32 AM  In your present state of health, do you have any difficulty performing the following activities:  Hearing? 0  Vision? 0  Difficulty concentrating or making decisions? 1  Walking or climbing stairs? 0  Dressing or bathing? 0  Doing errands, shopping? 0  Preparing Food and eating ? N  Using the Toilet? N  In the past six months, have you accidently leaked urine? N  Do you have problems with loss of bowel control? N  Managing your Medications? N  Managing your Finances? N  Housekeeping or managing your Housekeeping? N    Patient Care Team: Jeoffrey Massed, MD as PCP - General (Family Medicine) Richardean Chimera, MD as Consulting Physician (Obstetrics and Gynecology) Toni Arthurs, MD as Consulting Physician (Orthopedic Surgery) Su Hoff, PA-C as Physician Assistant (Orthopedic Surgery) Rene Paci, MD as Consulting Physician (Urology)  Indicate any recent Medical Services you may have received from other than [facility] providers in the past year (date may be approximate).     Assessment:   This is a routine wellness examination for Azhia.  Hearing/Vision screen Hearing Screening - Comments:: No trouble hearing Vision Screening - Comments:: Up to date Unsure of name   Goals Addressed             This Visit's Progress    Increase physical activity         Depression Screen    04/04/2023   11:35 AM 02/22/2023    9:16 AM 02/22/2023    9:15 AM 04/10/2022    3:42 PM 03/29/2022   11:08 AM 01/02/2022  10:10 AM 09/30/2021   10:35 AM  PHQ 2/9 Scores  PHQ - 2 Score 0 0 0 0 0 0 0  PHQ- 9 Score 2 0  1       Fall Risk    04/04/2023   11:30 AM 03/29/2022   11:12 AM 01/02/2022   10:10 AM  03/22/2021    2:40 PM 10/06/2020   11:26 AM  Fall Risk   Falls in the past year? 0 0 0 0 0  Number falls in past yr: 0 0  0 0  Injury with Fall? 0 0  0 0  Risk for fall due to :  Impaired balance/gait;Impaired vision No Fall Risks No Fall Risks   Follow up Falls evaluation completed;Education provided;Falls prevention discussed Falls prevention discussed Falls evaluation completed Falls evaluation completed Falls evaluation completed    MEDICARE RISK AT HOME: Medicare Risk at Home Any stairs in or around the home?: No If so, are there any without handrails?: No Home free of loose throw rugs in walkways, pet beds, electrical cords, etc?: Yes Adequate lighting in your home to reduce risk of falls?: Yes Life alert?: No Use of a cane, walker or w/c?: No Grab bars in the bathroom?: No Shower chair or bench in shower?: Yes Elevated toilet seat or a handicapped toilet?: Yes  TIMED UP AND GO:  Was the test performed?  No    Cognitive Function:        04/04/2023   11:34 AM 03/29/2022   11:13 AM 03/22/2021    2:42 PM  6CIT Screen  What Year? 0 points 0 points 0 points  What month? 0 points 0 points 0 points  What time? 0 points 0 points 0 points  Count back from 20 0 points 0 points 0 points  Months in reverse 0 points 0 points 0 points  Repeat phrase 0 points 0 points 0 points  Total Score 0 points 0 points 0 points    Immunizations Immunization History  Administered Date(s) Administered   Hepatitis B 08/10/2010, 05/26/2011, 09/08/2011   Influenza, Seasonal, Injecte, Preservative Fre 11/16/2022   Influenza,inj,Quad PF,6+ Mos 11/21/2013, 01/15/2015, 09/22/2016, 09/13/2017, 10/04/2018, 10/28/2019, 10/06/2020, 09/30/2021   Influenza-Unspecified 11/01/2017   Tdap 08/10/2010, 08/09/2022   Zoster Recombinant(Shingrix) 10/06/2020, 01/24/2021    TDAP status: Up to date  Flu Vaccine status: Up to date    Covid-19 vaccine status: Information provided on how to obtain vaccines.    Qualifies for Shingles Vaccine? No   Zostavax completed Yes   Shingrix Completed?: Yes  Screening Tests Health Maintenance  Topic Date Due   COVID-19 Vaccine (1) Never done   MAMMOGRAM  05/14/2022   Medicare Annual Wellness (AWV)  04/03/2024   Fecal DNA (Cologuard)  05/03/2024   DTaP/Tdap/Td (3 - Td or Tdap) 08/08/2032   INFLUENZA VACCINE  Completed   Zoster Vaccines- Shingrix  Completed   HPV VACCINES  Aged Out   Hepatitis C Screening  Discontinued   HIV Screening  Discontinued    Health Maintenance  Health Maintenance Due  Topic Date Due   COVID-19 Vaccine (1) Never done   MAMMOGRAM  05/14/2022    Colorectal cancer screening: Type of screening: Cologuard. Completed 2026. Repeat every 3 years  Mammogram status: Completed  . Repeat every year  Bone Density Booking through OBGYN  Lung Cancer Screening: (Low Dose CT Chest recommended if Age 9-80 years, 20 pack-year currently smoking OR have quit w/in 15years.) does not qualify.   Lung Cancer Screening Referral:  Additional Screening:  Hepatitis C Screening:  never done  Vision Screening: Recommended annual ophthalmology exams for early detection of glaucoma and other disorders of the eye. Is the patient up to date with their annual eye exam?  Yes  Who is the provider or what is the name of the office in which the patient attends annual eye exams? Unsure of name If pt is not established with a provider, would they like to be referred to a provider to establish care? No .   Dental Screening: Recommended annual dental exams for proper oral hygiene   Community Resource Referral / Chronic Care Management: CRR required this visit?  No   CCM required this visit?  No     Plan:     I have personally reviewed and noted the following in the patient's chart:   Medical and social history Use of alcohol, tobacco or illicit drugs  Current medications and supplements including opioid prescriptions. Patient is  currently taking opioid prescriptions. Information provided to patient regarding non-opioid alternatives. Patient advised to discuss non-opioid treatment plan with their provider. Functional ability and status Nutritional status Physical activity Advanced directives List of other physicians Hospitalizations, surgeries, and ER visits in previous 12 months Vitals Screenings to include cognitive, depression, and falls Referrals and appointments  In addition, I have reviewed and discussed with patient certain preventive protocols, quality metrics, and best practice recommendations. A written personalized care plan for preventive services as well as general preventive health recommendations were provided to patient.     Remi Haggard, LPN   [id number]/[id number]   After Visit Summary: (MyChart) Due to this being a telephonic visit, the after visit summary with patients personalized plan was offered to patient via MyChart   Nurse Notes:

## 2023-04-23 ENCOUNTER — Encounter: Payer: Self-pay | Admitting: Urology

## 2023-04-24 ENCOUNTER — Ambulatory Visit
Admission: RE | Admit: 2023-04-24 | Discharge: 2023-04-24 | Disposition: A | Discharge status: Home / Self Care | Payer: Medicare Other | Source: Ambulatory Visit | Attending: Urology | Admitting: Urology

## 2023-04-24 DIAGNOSIS — C642 Malignant neoplasm of left kidney, except renal pelvis: Secondary | ICD-10-CM

## 2023-04-24 DIAGNOSIS — K862 Cyst of pancreas: Secondary | ICD-10-CM | POA: Diagnosis not present

## 2023-04-24 DIAGNOSIS — Z905 Acquired absence of kidney: Secondary | ICD-10-CM | POA: Diagnosis not present

## 2023-04-24 MED ORDER — GADOPICLENOL 0.5 MMOL/ML IV SOLN
7.0000 mL | Freq: Once | INTRAVENOUS | Status: AC | PRN
  Administered 2023-04-24: 7 mL via INTRAVENOUS

## 2023-05-22 ENCOUNTER — Encounter: Payer: Medicare Other | Admitting: Family Medicine

## 2023-05-22 NOTE — Progress Notes (Deleted)
 Office Note 05/22/2023  CC: No chief complaint on file.  Patient is a 63 y.o. female who is here for annual health maintenance exam and follow-up chronic pain syndrome and anxiety/depression. A/P as of last visit: "1 chronic pain syndrome. scoliosis and multilevel spondylosis.  Myofascial pain syndrome. We will continue with Vicodin 5-3 25, 1 3 times daily as needed, number 90/month. Continue Robaxin  500 mg, 1-2 every 6 hours as needed. Controlled substance contract up-to-date. UDS UTD.   2.  Recurrent major depressive disorder, chronic anxiety. Well-controlled on citalopram  40 mg a day. Alprazolam  1 mg twice daily.   #3 dry eye syndrome. She is on cyclosporine and fluoromethalone eyedrops now. She has switched from contacts to spectacles."  INTERIM HX: ***   PMP AWARE reviewed today: most recent rx for Vicodin was filled 04/27/2023, rx by me, number 90.  Most recent prescription for alprazolam  was filled 02/09/2023, #180, prescription by me. No red flags.  Past Medical History:  Diagnosis Date   Adult ADHD    Anxiety    Aortic atherosclerosis (HCC) 08/2016   Chronic back pain    neck, too.  MRI L spine 05/2008: 40 degrees dextroscoliosis due to a hemivertebra of L3.  Some DDD/spondylosis w/out spinal stenosis.   Colon cancer screening    04/2021 Cologuard negative.   Complication of anesthesia    drop in BP, difficult to wake up, shallow breathing   Depression    Diastolic dysfunction, left ventricle 05/2018   Echo with grd I DD.   Dry eye syndrome    GERD (gastroesophageal reflux disease)    Gout    2 attacks total as of 12/2014.   Heart murmur    Kidney stones 2005; 2017; 2018   Most recent CT 08/2016 showed 3 mm nonobstructing L nephrolithiasis.   Migraine    Proph tx with topamax  helps, abortive tx with relpax  helps.  Couldn't afford topamax  so d/c'd this 04/2016.     Navicular fracture, foot 03/2018   Right Navicular fx-nonunion (surgery)   Pancreatic lesion     10/2021 6mm cystic lesion, stable 03/2022. Rpt 1 yr   Pulmonary nodule 2018   5 mm LLL, stable on 2 yr f/u imaging 2022-->benign, no further imaging needed.   Renal cancer, left (HCC)    partial nephrectomy 06/07/21   Restless legs syndrome    Pt can get to sleep fine so she declines med for this as of 08/2016    Past Surgical History:  Procedure Laterality Date   BREAST SURGERY  1999   reduction   ENDOMETRIAL ABLATION  2002   FOOT FRACTURE SURGERY  03/2018   Closed displaced fracture of right navicular, nonunion.   KIDNEY STONE SURGERY  2005   LAPAROSCOPIC VAGINAL HYSTERECTOMY  12/02/2007   for DUB   LUMBAR ESI  2021   no help ([facility])   REDUCTION MAMMAPLASTY     ROBOTIC ASSITED PARTIAL NEPHRECTOMY Left 06/07/2021   Procedure: XI ROBOTIC ASSITED PARTIAL NEPHRECTOMY;  Surgeon: Adelbert Homans, MD;  Location: [facility];  Service: Urology;  Laterality: Left;   TRANSTHORACIC ECHOCARDIOGRAM  06/13/2018   Grd I DD   WISDOM TOOTH EXTRACTION      Family History  Problem Relation Age of Onset   Cancer Mother        brain ca (glioblastoma)   Breast cancer Paternal Aunt    Breast cancer Maternal Grandmother     Social History   Socioeconomic History   Marital status: Married  Spouse name: Not on file   Number of children: Not on file   Years of education: Not on file   Highest education level: Not on file  Occupational History   Not on file  Tobacco Use   Smoking status: Never   Smokeless tobacco: Never  Vaping Use   Vaping status: Never Used  Substance and Sexual Activity   Alcohol use: No   Drug use: No   Sexual activity: Not Currently  Other Topics Concern   Not on file  Social History Narrative   Married, one adult son.  Lives in [place], [place].   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    Exercise: walks 3 miles per day.   No T/A/Ds.               Social Drivers of Corporate investment banker Strain: Low Risk  (04/04/2023)    Overall Financial Resource Strain (CARDIA)    Difficulty of Paying Living Expenses: Not hard at all  Food Insecurity: No Food Insecurity (04/04/2023)   Hunger Vital Sign    Worried About Running Out of Food in the Last Year: Never true    Ran Out of Food in the Last Year: Never true  Transportation Needs: No Transportation Needs (04/04/2023)   PRAPARE - Administrator, Civil Service (Medical): No    Lack of Transportation (Non-Medical): No  Physical Activity: Sufficiently Active (04/04/2023)   Exercise Vital Sign    Days of Exercise per Week: 4 days    Minutes of Exercise per Session: 40 min  Recent Concern: Physical Activity - Inactive (04/04/2023)   Exercise Vital Sign    Days of Exercise per Week: 0 days    Minutes of Exercise per Session: 0 min  Stress: No Stress Concern Present (04/04/2023)   [employer] of Occupational Health - Occupational Stress Questionnaire    Feeling of Stress : Not at all  Social Connections: Socially Integrated (04/04/2023)   Social Connection and Isolation Panel [NHANES]    Frequency of Communication with Friends and Family: More than three times a week    Frequency of Social Gatherings with Friends and Family: More than three times a week    Attends Religious Services: More than 4 times per year    Active Member of [employer] or Organizations: Yes    Attends Banker Meetings: 1 to 4 times per year    Marital Status: Married  Catering manager Violence: Not At Risk (04/04/2023)   Humiliation, Afraid, Rape, and Kick questionnaire    Fear of Current or Ex-Partner: No    Emotionally Abused: No    Physically Abused: No    Sexually Abused: No    Outpatient Medications Prior to Visit  Medication Sig Dispense Refill   albuterol  (VENTOLIN  HFA) 108 (90 Base) MCG/ACT inhaler Inhale 2 puffs into the lungs every 4 (four) hours as needed for wheezing or shortness of breath. 18 g 0   ALPRAZolam  (XANAX ) 1 MG tablet TAKE ONE TABLET BY MOUTH  TWICE A DAY AS NEEDED FOR ANXIETY 180 tablet 1   citalopram  (CELEXA ) 40 MG tablet 1 tab po qd 90 tablet 3   Cyanocobalamin (VITAMIN B-12 PO) Take 1 tablet by mouth daily. Reported on 04/07/2015     cycloSPORINE (CEQUA OP) Apply to eye in the morning and at bedtime.     eletriptan  (RELPAX ) 40 MG tablet TAKE 1 TABLET BY MOUTH AT ONSET OF HEADACHE MAY REPEAT IN 2 HOURS  IF HEADACHE PERSISTS OR RECURS 18 tablet 1   fluconazole  (DIFLUCAN ) 150 MG tablet 1 tab p.o. daily x 10 days 10 tablet 0   fluorometholone (FML) 0.1 % ophthalmic suspension SMARTSIG:In Eye(s)     fluorouracil  (EFUDEX ) 5 % cream Apply to areas of sun damage on the face twice daily for 4-5 days 40 g 6   HYDROcodone -acetaminophen  (NORCO/VICODIN) 5-325 MG tablet Take 1 tablet by mouth 3 (three) times daily as needed. 90 tablet 0   ibuprofen  (ADVIL ) 600 MG tablet TAKE ONE TABLET (600MG  TOTAL) BY MOUTH EVERY 8 HOURS AS NEEDED 30 tablet 3   lidocaine  (LIDODERM ) 5 % 1 patch to the affected area q12h prn 30 patch 0   methocarbamol  (ROBAXIN ) 500 MG tablet TAKE 1 OR 2 TABLETS BY MOUTH EVERY 6 HOURS AS NEEDED FOR MUSCULAR RELAXATION 30 tablet 6   pantoprazole  (PROTONIX ) 40 MG tablet TAKE ONE (1) TABLET BY MOUTH EVERY DAY 90 tablet 3   tiZANidine  (ZANAFLEX ) 4 MG tablet 1 tab po qhs prn 90 tablet 1   tretinoin  (RETIN-A ) 0.025 % cream Apply 1 application topically at bedtime as needed. For acne prevention/control 45 g 3   No facility-administered medications prior to visit.    Allergies  Allergen Reactions   Imitrex  [Sumatriptan ] Other (See Comments)    Face and Arm numbness    Review of Systems *** PE;    02/22/2023    9:16 AM 11/16/2022    1:57 PM 08/09/2022   10:05 AM  Vitals with BMI  Height 5\' 3"     Weight  147 lbs 3 oz 147 lbs 10 oz  Systolic 104 98 104  Diastolic 68 68 73  Pulse 67 65 66     *** Pertinent labs:  Lab Results  Component Value Date   TSH 0.71 04/10/2022   Lab Results  Component Value Date   WBC 5.0  04/10/2022   HGB 13.4 04/10/2022   HCT 40.2 04/10/2022   MCV 89.5 04/10/2022   PLT 210.0 04/10/2022   Lab Results  Component Value Date   CREATININE 0.83 04/10/2022   BUN 8 04/10/2022   NA 138 04/10/2022   K 4.6 04/10/2022   CL 101 04/10/2022   CO2 32 04/10/2022   Lab Results  Component Value Date   ALT 12 04/10/2022   AST 20 04/10/2022   ALKPHOS 64 04/10/2022   BILITOT 0.4 04/10/2022   Lab Results  Component Value Date   CHOL 194 04/10/2022   Lab Results  Component Value Date   HDL 52.40 04/10/2022   Lab Results  Component Value Date   LDLCALC 115 (H) 04/10/2022   Lab Results  Component Value Date   TRIG 132.0 04/10/2022   Lab Results  Component Value Date   CHOLHDL 4 04/10/2022   ASSESSMENT AND PLAN:   No problem-specific Assessment & Plan notes found for this encounter.  #1 health maintenance exam: Reviewed age and gender appropriate health maintenance issues (prudent diet, regular exercise, health risks of tobacco and excessive alcohol, use of seatbelts, fire alarms in home, use of sunscreen).  Also reviewed age and gender appropriate health screening as well as vaccine recommendations. Vaccines: UTD. LABS: health panel ordered today Cervical ca screening: hx of hysterectomy for benign dx-->no further pelvics/paps indicated. Breast ca screening: normal mammogram for 2023.  Due for repeat-> ordered***.  Colon cancer screening: Cologuard negative 2023.  Repeat 2026.  An After Visit Summary was printed and given to the patient.  FOLLOW UP:  No follow-ups on file.  Signed:  Arletha Lady, MD           05/22/2023

## 2023-05-27 ENCOUNTER — Other Ambulatory Visit: Payer: Self-pay | Admitting: Family Medicine

## 2023-05-28 MED ORDER — HYDROCODONE-ACETAMINOPHEN 5-325 MG PO TABS: 1.0000 | ORAL_TABLET | Freq: Three times a day (TID) | ORAL | 0 refills | Status: DC | PRN

## 2023-05-28 NOTE — Telephone Encounter (Signed)
Prescription sent. Patient due for follow up

## 2023-05-31 ENCOUNTER — Other Ambulatory Visit: Payer: Self-pay | Admitting: Family Medicine

## 2023-06-01 ENCOUNTER — Other Ambulatory Visit (HOSPITAL_COMMUNITY): Payer: Self-pay

## 2023-06-01 ENCOUNTER — Telehealth: Payer: Self-pay

## 2023-06-01 NOTE — Telephone Encounter (Signed)
 Copied from CRM [id number]. Topic: Clinical - Medication Prior Auth >> Jun 01, 2023 12:37 PM [place] J wrote: Reason for CRM: Sherline Distel calling in from [facility] wanting to give a verbal approval for the patient's Eletriptan  Hydrobromide 40MG  tablets. She will be sending a fax over as well.  Effective: 06/01/2023-05/31/2024

## 2023-06-01 NOTE — Telephone Encounter (Signed)
 Pharmacy Patient Advocate Encounter   Received notification from CoverMyMeds that prior authorization for Eletriptan  Hydrobromide 40MG  tablets is required/requested.   Insurance verification completed.   The patient is insured through [facility] .   Per test claim: PA required; PA submitted to above mentioned insurance via CoverMyMeds Key/confirmation #/EOC [id number] Status is pending

## 2023-06-08 ENCOUNTER — Encounter: Admitting: Family Medicine

## 2023-06-08 NOTE — Progress Notes (Deleted)
 OFFICE VISIT  06/08/2023  CC: No chief complaint on file.   Patient is a 63 y.o. female who presents for annual health maintenance exam and 52-month follow-up chronic pain syndrome as well as chronic anxiety and recurrent depression. A/P as of last visit: " chronic pain syndrome. scoliosis and multilevel spondylosis.  Myofascial pain syndrome. We will continue with Vicodin 5-3 25, 1 3 times daily as needed, number 90/month. Continue Robaxin  500 mg, 1-2 every 6 hours as needed. Controlled substance contract up-to-date. UDS UTD.   2.  Recurrent major depressive disorder, chronic anxiety. Well-controlled on citalopram  40 mg a day. Alprazolam  1 mg twice daily.   #3 dry eye syndrome. She is on cyclosporine and fluoromethalone eyedrops now. She has switched from contacts to spectacles."  INTERIM HX: ***   Indication for chronic opioid: neck, mid back, low back, HA's, myofascial pain, arthralgias of appendicular skeleton.  Some days finds it hard to get out of bed and function.   She carries dx of DDD of C and L spine, significant scoliosis, myofascial pain syndrome with trigger points and recurrent trochanteric bursitis.  Has seen a handful of specialists in the past ([facility] [place], [place], in [place], also [facility] center in [place]).  She has never been managed by a pain mgmt specialist.  PMP AWARE reviewed today: most recent rx for alprazolam  was filled 05/31/2023, # 90, rx by me.  Most recent Vicodin 5/325 prescription filled 05/28/2023, #90, prescription by me. No red flags.  Past Medical History:  Diagnosis Date   Adult ADHD    Anxiety    Aortic atherosclerosis (HCC) 08/2016   Chronic back pain    neck, too.  MRI L spine 05/2008: 40 degrees dextroscoliosis due to a hemivertebra of L3.  Some DDD/spondylosis w/out spinal stenosis.   Colon cancer screening    04/2021 Cologuard negative.   Complication of anesthesia    drop in BP, difficult to wake  up, shallow breathing   Depression    Diastolic dysfunction, left ventricle 05/2018   Echo with grd I DD.   Dry eye syndrome    GERD (gastroesophageal reflux disease)    Gout    2 attacks total as of 12/2014.   Heart murmur    Kidney stones 2005; 2017; 2018   Most recent CT 08/2016 showed 3 mm nonobstructing L nephrolithiasis.   Migraine    Proph tx with topamax  helps, abortive tx with relpax  helps.  Couldn't afford topamax  so d/c'd this 04/2016.     Navicular fracture, foot 03/2018   Right Navicular fx-nonunion (surgery)   Pancreatic lesion    10/2021 6mm cystic lesion, stable 03/2022. Rpt 1 yr   Pulmonary nodule 2018   5 mm LLL, stable on 2 yr f/u imaging 2022-->benign, no further imaging needed.   Renal cancer, left (HCC)    partial nephrectomy 06/07/21   Restless legs syndrome    Pt can get to sleep fine so she declines med for this as of 08/2016    Past Surgical History:  Procedure Laterality Date   BREAST SURGERY  1999   reduction   ENDOMETRIAL ABLATION  2002   FOOT FRACTURE SURGERY  03/2018   Closed displaced fracture of right navicular, nonunion.   KIDNEY STONE SURGERY  2005   LAPAROSCOPIC VAGINAL HYSTERECTOMY  12/02/2007   for DUB   LUMBAR ESI  2021   no help ([facility])   REDUCTION MAMMAPLASTY     ROBOTIC ASSITED  PARTIAL NEPHRECTOMY Left 06/07/2021   Procedure: XI ROBOTIC ASSITED PARTIAL NEPHRECTOMY;  Surgeon: Adelbert Homans, MD;  Location: [facility];  Service: Urology;  Laterality: Left;   TRANSTHORACIC ECHOCARDIOGRAM  06/13/2018   Grd I DD   WISDOM TOOTH EXTRACTION      Outpatient Medications Prior to Visit  Medication Sig Dispense Refill   albuterol  (VENTOLIN  HFA) 108 (90 Base) MCG/ACT inhaler Inhale 2 puffs into the lungs every 4 (four) hours as needed for wheezing or shortness of breath. 18 g 0   ALPRAZolam  (XANAX ) 1 MG tablet TAKE ONE TABLET BY MOUTH TWICE A DAY AS NEEDED FOR ANXIETY 180 tablet 1   citalopram  (CELEXA ) 40 MG tablet 1 tab po qd 90 tablet 3    Cyanocobalamin (VITAMIN B-12 PO) Take 1 tablet by mouth daily. Reported on 04/07/2015     cycloSPORINE (CEQUA OP) Apply to eye in the morning and at bedtime.     eletriptan  (RELPAX ) 40 MG tablet TAKE 1 TABLET BY MOUTH AT ONSET OF HEADACHE , MAY REPEAT IN 2 HOURS IF HEADACHE PERSISTS OR RECURS. 18 tablet 0   fluconazole  (DIFLUCAN ) 150 MG tablet 1 tab p.o. daily x 10 days 10 tablet 0   fluorometholone (FML) 0.1 % ophthalmic suspension SMARTSIG:In Eye(s)     fluorouracil  (EFUDEX ) 5 % cream Apply to areas of sun damage on the face twice daily for 4-5 days 40 g 6   HYDROcodone -acetaminophen  (NORCO/VICODIN) 5-325 MG tablet Take 1 tablet by mouth 3 (three) times daily as needed. 90 tablet 0   ibuprofen  (ADVIL ) 600 MG tablet TAKE ONE TABLET (600MG  TOTAL) BY MOUTH EVERY 8 HOURS AS NEEDED 30 tablet 3   lidocaine  (LIDODERM ) 5 % 1 patch to the affected area q12h prn 30 patch 0   methocarbamol  (ROBAXIN ) 500 MG tablet TAKE 1 OR 2 TABLETS BY MOUTH EVERY 6 HOURS AS NEEDED FOR MUSCULAR RELAXATION 30 tablet 6   pantoprazole  (PROTONIX ) 40 MG tablet TAKE ONE (1) TABLET BY MOUTH EVERY DAY 90 tablet 3   tiZANidine  (ZANAFLEX ) 4 MG tablet 1 tab po qhs prn 90 tablet 1   tretinoin  (RETIN-A ) 0.025 % cream Apply 1 application topically at bedtime as needed. For acne prevention/control 45 g 3   No facility-administered medications prior to visit.    Allergies  Allergen Reactions   Imitrex  [Sumatriptan ] Other (See Comments)    Face and Arm numbness    Review of Systems As per HPI  PE:    02/22/2023    9:16 AM 11/16/2022    1:57 PM 08/09/2022   10:05 AM  Vitals with BMI  Height 5\' 3"     Weight  147 lbs 3 oz 147 lbs 10 oz  Systolic 104 98 104  Diastolic 68 68 73  Pulse 67 65 66     Physical Exam  ***  LABS:  Last CBC Lab Results  Component Value Date   WBC 5.0 04/10/2022   HGB 13.4 04/10/2022   HCT 40.2 04/10/2022   MCV 89.5 04/10/2022   MCH 30.6 06/08/2021   RDW 13.1 04/10/2022   PLT 210.0  04/10/2022   Last metabolic panel Lab Results  Component Value Date   GLUCOSE 81 04/10/2022   NA 138 04/10/2022   K 4.6 04/10/2022   CL 101 04/10/2022   CO2 32 04/10/2022   BUN 8 04/10/2022   CREATININE 0.83 04/10/2022   GFR 76.96 04/10/2022   CALCIUM 9.7 04/10/2022   PROT 6.6 04/10/2022   ALBUMIN 4.4 04/10/2022  BILITOT 0.4 04/10/2022   ALKPHOS 64 04/10/2022   AST 20 04/10/2022   ALT 12 04/10/2022   ANIONGAP 6 06/08/2021   Last lipids Lab Results  Component Value Date   CHOL 194 04/10/2022   HDL 52.40 04/10/2022   LDLCALC 115 (H) 04/10/2022   TRIG 132.0 04/10/2022   CHOLHDL 4 04/10/2022   Last thyroid  functions Lab Results  Component Value Date   TSH 0.71 04/10/2022   Last vitamin D  Lab Results  Component Value Date   VD25OH 37 09/08/2011   Last vitamin B12 and Folate Lab Results  Component Value Date   VITAMINB12 1,292 (H) 09/08/2011   IMPRESSION AND PLAN:  No problem-specific Assessment & Plan notes found for this encounter.  #1 health maintenance exam: Reviewed age and gender appropriate health maintenance issues (prudent diet, regular exercise, health risks of tobacco and excessive alcohol, use of seatbelts, fire alarms in home, use of sunscreen).  Also reviewed age and gender appropriate health screening as well as vaccine recommendations. Vaccines: All UTD. LABS: health panel ordered today Cervical ca screening: hx of hysterectomy for benign dx-->no further pelvics/paps indicated. Breast ca screening: normal mammogram for 2023.  Due for repeat->ordered.  Colon cancer screening: Cologuard negative 2023.  Repeat 2026.  An After Visit Summary was printed and given to the patient.  FOLLOW UP: No follow-ups on file.  Signed:  Arletha Lady, MD           06/08/2023

## 2023-07-10 ENCOUNTER — Other Ambulatory Visit: Payer: Self-pay | Admitting: Family Medicine

## 2023-07-11 ENCOUNTER — Other Ambulatory Visit: Payer: Self-pay | Admitting: Family Medicine

## 2023-07-11 NOTE — Telephone Encounter (Signed)
 Requesting: Contract: 05/06/21 UDS: 08/09/22 Last Visit: 02/22/23 Next Visit: 04/04/24 Last Refill: 05/28/23 (90,0)  Please Advise. Rx pending

## 2023-07-12 NOTE — Telephone Encounter (Signed)
 Rx sent. Due for f/u before any further rx's.

## 2023-08-06 ENCOUNTER — Other Ambulatory Visit: Payer: Self-pay | Admitting: Family Medicine

## 2023-08-09 ENCOUNTER — Encounter: Payer: Self-pay | Admitting: Family Medicine

## 2023-08-13 ENCOUNTER — Telehealth: Payer: Self-pay | Admitting: Family Medicine

## 2023-08-20 ENCOUNTER — Ambulatory Visit: Admitting: Family Medicine

## 2023-08-23 ENCOUNTER — Ambulatory Visit: Admitting: Family Medicine

## 2023-08-23 ENCOUNTER — Encounter: Payer: Self-pay | Admitting: Family Medicine

## 2023-08-23 VITALS — BP 99/62 | HR 59 | Temp 98.1°F | Ht 63.0 in | Wt 151.8 lb

## 2023-08-23 DIAGNOSIS — F411 Generalized anxiety disorder: Secondary | ICD-10-CM | POA: Diagnosis not present

## 2023-08-23 DIAGNOSIS — G894 Chronic pain syndrome: Secondary | ICD-10-CM

## 2023-08-23 DIAGNOSIS — Z79899 Other long term (current) drug therapy: Secondary | ICD-10-CM | POA: Diagnosis not present

## 2023-08-23 DIAGNOSIS — G8929 Other chronic pain: Secondary | ICD-10-CM | POA: Diagnosis not present

## 2023-08-23 DIAGNOSIS — F3340 Major depressive disorder, recurrent, in remission, unspecified: Secondary | ICD-10-CM

## 2023-08-23 DIAGNOSIS — Z0000 Encounter for general adult medical examination without abnormal findings: Secondary | ICD-10-CM | POA: Diagnosis not present

## 2023-08-23 DIAGNOSIS — Z13220 Encounter for screening for lipoid disorders: Secondary | ICD-10-CM

## 2023-08-23 DIAGNOSIS — Z1231 Encounter for screening mammogram for malignant neoplasm of breast: Secondary | ICD-10-CM | POA: Diagnosis not present

## 2023-08-23 DIAGNOSIS — M545 Low back pain, unspecified: Secondary | ICD-10-CM | POA: Diagnosis not present

## 2023-08-23 NOTE — Progress Notes (Signed)
Office Note 08/23/2023  CC:  Chief Complaint  Patient presents with   Medical Management of Chronic Issues   Patient is a 63 y.o. female who is here for annual health maintenance exam and f/u chronic pain syndrome, anx/dep. A/P as of last visit: 1 chronic pain syndrome. scoliosis and multilevel spondylosis.  Myofascial pain syndrome. We will continue with Vicodin 5-3 25, 1 3 times daily as needed, number 90/month. Continue Robaxin  500 mg, 1-2 every 6 hours as needed. Controlled substance contract up-to-date. UDS UTD.   2.  Recurrent major depressive disorder, chronic anxiety. Well-controlled on citalopram  40 mg a day. Alprazolam  1 mg twice daily.   #3 dry eye syndrome. She is on cyclosporine and fluoromethalone eyedrops now. She has switched from contacts to spectacles.  INTERIM HX: Carlean feels great. Mood and anxiety levels very good/stable.    She feels like her pain has been better lately, she is trying to use less of her pain medication. Indication for chronic opioid: neck, mid back, low back, HA's, myofascial pain, arthralgias of appendicular skeleton.  Some days finds it hard to get out of bed and function.   She carries dx of DDD of C and L spine, significant scoliosis, myofascial pain syndrome with trigger points and recurrent trochanteric bursitis.  Has seen a handful of specialists in the past ([facility] [place] [place] [facility], neurolog[place], [place], also [facility] in [place]).  She has never been managed by a pain mgmt specialist.  PMP AWARE reviewed today: most recent rx for Vicodin 5/325 was filled 09/12/2023, # 90, rx by me.  Most recent prescription for alprazolam  1 mg was filled 05/31/2023, #180, prescription by me. No red flags.   Past Medical History:  Diagnosis Date   Adult ADHD    Anxiety    Aortic atherosclerosis (HCC) 08/2016   Chronic back pain    neck, too.  MRI L spine 05/2008: 40 degrees dextroscoliosis due to a hemivertebra of  L3.  Some DDD/spondylosis w/out spinal stenosis.   Colon cancer screening    04/2021 Cologuard negative.   Complication of anesthesia    drop in BP, difficult to wake up, shallow breathing   Depression    Diastolic dysfunction, left ventricle 05/2018   Echo with grd I DD.   Dry eye syndrome    GERD (gastroesophageal reflux disease)    Gout    2 attacks total as of 12/2014.   Heart murmur    Kidney stones 2005; 2017; 2018   Most recent CT 08/2016 showed 3 mm nonobstructing L nephrolithiasis.   Migraine    Proph tx with topamax  helps, abortive tx with relpax  helps.  Couldn't afford topamax  so d/c'd this 04/2016.     Navicular fracture, foot 03/2018   Right Navicular fx-nonunion (surgery)   Pancreatic lesion    10/2021 6mm cystic lesion, stable 03/2022. Rpt 1 yr   Pulmonary nodule 2018   5 mm LLL, stable on 2 yr f/u imaging 2022-->benign, no further imaging needed.   Renal cancer, left (HCC)    partial nephrectomy 06/07/21   Restless legs syndrome    Pt can get to sleep fine so she declines med for this as of 08/2016    Past Surgical History:  Procedure Laterality Date   BREAST SURGERY  1999   reduction   ENDOMETRIAL ABLATION  2002   FOOT FRACTURE SURGERY  03/2018   Closed displaced fracture of right navicular, nonunion.   KIDNEY STONE SURGERY  2005   LAPAROSCOPIC VAGINAL HYSTERECTOMY  12/02/2007   for DUB   LUMBAR ESI  2021   no help ([facility])   REDUCTION MAMMAPLASTY     ROBOTIC ASSITED PARTIAL NEPHRECTOMY Left 06/07/2021   Procedure: XI ROBOTIC ASSITED PARTIAL NEPHRECTOMY;  Surgeon: Devere Lonni Righter, MD;  Location: [facility];  Service: Urology;  Laterality: Left;   TRANSTHORACIC ECHOCARDIOGRAM  06/13/2018   Grd I DD   WISDOM TOOTH EXTRACTION      Family History  Problem Relation Age of Onset   Cancer Mother        brain ca (glioblastoma)   Breast cancer Paternal Aunt    Breast cancer Maternal Grandmother     Social History   Socioeconomic History   Marital  status: Married    Spouse name: Not on file   Number of children: Not on file   Years of education: Not on file   Highest education level: Not on file  Occupational History   Not on file  Tobacco Use   Smoking status: Never   Smokeless tobacco: Never  Vaping Use   Vaping status: Never Used  Substance and Sexual Activity   Alcohol use: No   Drug use: No   Sexual activity: Not Currently  Other Topics Concern   Not on file  Social History Narrative   Married, one adult son.  Lives in [place], [place].   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    Exercise: walks 3 miles per day.   No T/A/Ds.               Social Drivers of Corporate investment banker Strain: Low Risk  (04/04/2023)   Overall Financial Resource Strain (CARDIA)    Difficulty of Paying Living Expenses: Not hard at all  Food Insecurity: No Food Insecurity (04/04/2023)   Hunger Vital Sign    Worried About Running Out of Food in the Last Year: Never true    Ran Out of Food in the Last Year: Never true  Transportation Needs: No Transportation Needs (04/04/2023)   PRAPARE - Administrator, Civil Service (Medical): No    Lack of Transportation (Non-Medical): No  Physical Activity: Sufficiently Active (04/04/2023)   Exercise Vital Sign    Days of Exercise per Week: 4 days    Minutes of Exercise per Session: 40 min  Recent Concern: Physical Activity - Inactive (04/04/2023)   Exercise Vital Sign    Days of Exercise per Week: 0 days    Minutes of Exercise per Session: 0 min  Stress: No Stress Concern Present (04/04/2023)   [employer] of Occupational Health - Occupational Stress Questionnaire    Feeling of Stress : Not at all  Social Connections: Socially Integrated (04/04/2023)   Social Connection and Isolation Panel    Frequency of Communication with Friends and Family: More than three times a week    Frequency of Social Gatherings with Friends and Family: More than three times a week     Attends Religious Services: More than 4 times per year    Active Member of [employer] or Organizations: Yes    Attends Banker Meetings: 1 to 4 times per year    Marital Status: Married  Catering manager Violence: Not At Risk (04/04/2023)   Humiliation, Afraid, Rape, and Kick questionnaire    Fear of Current or Ex-Partner: No    Emotionally Abused: No    Physically Abused: No    Sexually Abused: No  Outpatient Medications Prior to Visit  Medication Sig Dispense Refill   albuterol  (VENTOLIN  HFA) 108 (90 Base) MCG/ACT inhaler Inhale 2 puffs into the lungs every 4 (four) hours as needed for wheezing or shortness of breath. 18 g 0   ALPRAZolam  (XANAX ) 1 MG tablet TAKE ONE TABLET BY MOUTH TWICE A DAY AS NEEDED FOR ANXIETY 180 tablet 1   citalopram  (CELEXA ) 40 MG tablet 1 tab po qd 90 tablet 3   Cyanocobalamin (VITAMIN B-12 PO) Take 1 tablet by mouth daily. Reported on 04/07/2015     eletriptan  (RELPAX ) 40 MG tablet TAKE 1 TABLET BY MOUTH AT ONSET OF HEADACHE. MAY  REPEAT  IN  2  HOURS  IF  HEADACHE  PERSISTS  OR  RECURS 18 tablet 0   estradiol (ESTRACE) 0.1 MG/GM vaginal cream Place 1 Applicatorful vaginally.     fluorometholone (FML) 0.1 % ophthalmic suspension SMARTSIG:In Eye(s)     HYDROcodone -acetaminophen  (NORCO/VICODIN) 5-325 MG tablet TAKE ONE TABLET BY MOUTH THREE TIMES DAILY AS NEEDED 90 tablet 0   ibuprofen  (ADVIL ) 600 MG tablet TAKE ONE TABLET (600MG  TOTAL) BY MOUTH EVERY 8 HOURS AS NEEDED 30 tablet 3   pantoprazole  (PROTONIX ) 40 MG tablet TAKE ONE (1) TABLET BY MOUTH EVERY DAY 90 tablet 1   tretinoin  (RETIN-A ) 0.025 % cream Apply 1 application topically at bedtime as needed. For acne prevention/control 45 g 3   cycloSPORINE (CEQUA OP) Apply to eye in the morning and at bedtime. (Patient not taking: Reported on 08/23/2023)     fluorouracil  (EFUDEX ) 5 % cream Apply to areas of sun damage on the face twice daily for 4-5 days (Patient not taking: Reported on 08/23/2023) 40 g 6    lidocaine  (LIDODERM ) 5 % 1 patch to the affected area q12h prn (Patient not taking: Reported on 08/23/2023) 30 patch 0   methocarbamol  (ROBAXIN ) 500 MG tablet TAKE 1 OR 2 TABLETS BY MOUTH EVERY 6 HOURS AS NEEDED FOR MUSCULAR RELAXATION (Patient not taking: Reported on 08/23/2023) 30 tablet 6   tiZANidine  (ZANAFLEX ) 4 MG tablet 1 tab po qhs prn (Patient not taking: Reported on 08/23/2023) 90 tablet 1   fluconazole  (DIFLUCAN ) 150 MG tablet 1 tab p.o. daily x 10 days 10 tablet 0   No facility-administered medications prior to visit.    Allergies  Allergen Reactions   Imitrex  [Sumatriptan ] Other (See Comments)    Face and Arm numbness    Review of Systems  Constitutional:  Negative for appetite change, chills, fatigue and fever.  HENT:  Negative for congestion, dental problem, ear pain and sore throat.   Eyes:  Negative for discharge, redness and visual disturbance.  Respiratory:  Negative for cough, chest tightness, shortness of breath and wheezing.   Cardiovascular:  Negative for chest pain, palpitations and leg swelling.  Gastrointestinal:  Negative for abdominal pain, blood in stool, diarrhea, nausea and vomiting.  Genitourinary:  Negative for difficulty urinating, dysuria, flank pain, frequency, hematuria and urgency.  Musculoskeletal:  Negative for arthralgias, back pain, joint swelling, myalgias and neck stiffness.  Skin:  Negative for pallor and rash.  Neurological:  Negative for dizziness, speech difficulty, weakness and headaches.  Hematological:  Negative for adenopathy. Does not bruise/bleed easily.  Psychiatric/Behavioral:  Negative for confusion and sleep disturbance. The patient is not nervous/anxious.     PE;    08/23/2023    2:40 PM 02/22/2023    9:16 AM 11/16/2022    1:57 PM  Vitals with BMI  Height 5' 3 5' 3  Weight 151 lbs 13 oz  147 lbs 3 oz  BMI 26.9    Systolic 99 104 98  Diastolic 62 68 68  Pulse 59 67 65   Exam chaperoned by Bobbetta Degree, CMA. Gen: Alert,  well appearing.  Patient is oriented to person, place, time, and situation. AFFECT: pleasant, lucid thought and speech. ENT: Ears: EACs clear, normal epithelium.  TMs with good light reflex and landmarks bilaterally.  Eyes: no injection, icteris, swelling, or exudate.  EOMI, PERRLA. Nose: no drainage or turbinate edema/swelling.  No injection or focal lesion.  Mouth: lips without lesion/swelling.  Oral mucosa pink and moist.  Dentition intact and without obvious caries or gingival swelling.  Oropharynx without erythema, exudate, or swelling.  Neck: supple/nontender.  No LAD, mass, or TM.  Carotid pulses 2+ bilaterally, without bruits. CV: RRR, no m/r/g.   LUNGS: CTA bilat, nonlabored resps, good aeration in all lung fields. ABD: soft, NT, ND, BS normal.  No hepatospenomegaly or mass.  No bruits. EXT: no clubbing, cyanosis, or edema.  Musculoskeletal: no joint swelling, erythema, warmth, or tenderness.  ROM of all joints intact. Skin - no sores or suspicious lesions or rashes or color changes  Pertinent labs:  Lab Results  Component Value Date   TSH 0.71 04/10/2022   Lab Results  Component Value Date   WBC 5.0 04/10/2022   HGB 13.4 04/10/2022   HCT 40.2 04/10/2022   MCV 89.5 04/10/2022   PLT 210.0 04/10/2022   Lab Results  Component Value Date   CREATININE 0.83 04/10/2022   BUN 8 04/10/2022   NA 138 04/10/2022   K 4.6 04/10/2022   CL 101 04/10/2022   CO2 32 04/10/2022   Lab Results  Component Value Date   ALT 12 04/10/2022   AST 20 04/10/2022   ALKPHOS 64 04/10/2022   BILITOT 0.4 04/10/2022   Lab Results  Component Value Date   CHOL 194 04/10/2022   Lab Results  Component Value Date   HDL 52.40 04/10/2022   Lab Results  Component Value Date   LDLCALC 115 (H) 04/10/2022   Lab Results  Component Value Date   TRIG 132.0 04/10/2022   Lab Results  Component Value Date   CHOLHDL 4 04/10/2022   ASSESSMENT AND PLAN:   No problem-specific Assessment & Plan notes  found for this encounter.  1 health maintenance exam: Reviewed age and gender appropriate health maintenance issues (prudent diet, regular exercise, health risks of tobacco and excessive alcohol, use of seatbelts, fire alarms in home, use of sunscreen).  Also reviewed age and gender appropriate health screening as well as vaccine recommendations. Vaccines: Prevnar 20-->given today.   Otherwise UTD. LABS: health panel ordered today Cervical ca screening: hx of hysterectomy for benign dx-->no further pelvics/paps indicated. Breast ca screening: normal mammogram for 2023.  Due for repeat-->ordered.  Colon cancer screening: Cologuard negative 2023.  Repeat 2026.  2 chronic pain syndrome. scoliosis and multilevel spondylosis.  Myofascial pain syndrome. We will continue with Vicodin 5-3 25, 1 3 times daily as needed, number 90/month. Continue Robaxin  500 mg, 1-2 every 6 hours as needed. Controlled substance contract up-to-date. UDS today.   3.  Recurrent major depressive disorder, chronic anxiety. Well-controlled on citalopram  40 mg a day. Alprazolam  1 mg twice daily.  An After Visit Summary was printed and given to the patient.  FOLLOW UP:  Return in about 3 months (around 11/23/2023) for routine chronic illness f/u.  Signed:  Phil Sylvia Kondracki, MD  08/23/2023  

## 2023-08-23 NOTE — Patient Instructions (Signed)

## 2023-08-24 LAB — COMPREHENSIVE METABOLIC PANEL WITH GFR
ALT: 14 U/L (ref 0–35)
AST: 21 U/L (ref 0–37)
Albumin: 4.4 g/dL (ref 3.5–5.2)
Alkaline Phosphatase: 63 U/L (ref 39–117)
BUN: 14 mg/dL (ref 6–23)
CO2: 32 meq/L (ref 19–32)
Calcium: 9.3 mg/dL (ref 8.4–10.5)
Chloride: 101 meq/L (ref 96–112)
Creatinine, Ser: 0.81 mg/dL (ref 0.40–1.20)
GFR: 78.49 mL/min (ref >60.00)
Glucose, Bld: 90 mg/dL (ref 70–99)
Potassium: 5.1 meq/L (ref 3.5–5.1)
Sodium: 140 meq/L (ref 135–145)
Total Bilirubin: 0.4 mg/dL (ref 0.2–1.2)
Total Protein: 6.7 g/dL (ref 6.0–8.3)

## 2023-08-24 LAB — LIPID PANEL
Cholesterol: 201 mg/dL — ABNORMAL HIGH (ref 0–200)
HDL: 51.5 mg/dL (ref >39.00)
LDL Cholesterol: 119 mg/dL — ABNORMAL HIGH (ref 0–99)
NonHDL: 149.17
Total CHOL/HDL Ratio: 4
Triglycerides: 152 mg/dL — ABNORMAL HIGH (ref 0.0–149.0)
VLDL: 30.4 mg/dL (ref 0.0–40.0)

## 2023-08-24 NOTE — Telephone Encounter (Signed)
 Reason for CRM: Patient requesting a call back from [facility], patient did not disclose the reason for the call and can be reached at [phone].

## 2023-08-24 NOTE — Telephone Encounter (Signed)
 Pt was calling regarding Prevnar 20 vaccine she received yesterday, she was advised to take Tylenol  or Ibuprofen  for the next 2-3 days. If she is still experiencing soreness or pain, call our office Monday.

## 2023-08-24 NOTE — Telephone Encounter (Signed)
 LVM for pt to return call

## 2023-08-25 ENCOUNTER — Ambulatory Visit: Payer: Self-pay | Admitting: Family Medicine

## 2023-08-25 LAB — DRUG MONITORING PANEL 375977 , URINE
Alcohol Metabolites: NEGATIVE ng/mL (ref <500)
Amphetamines: NEGATIVE ng/mL (ref <500)
Barbiturates: NEGATIVE ng/mL (ref <300)
Benzodiazepines: NEGATIVE ng/mL (ref <100)
Cocaine Metabolite: NEGATIVE ng/mL (ref <150)
Codeine: NEGATIVE ng/mL (ref <50)
Desmethyltramadol: NEGATIVE ng/mL (ref <100)
Hydrocodone: 2287 ng/mL — ABNORMAL HIGH (ref <50)
Hydromorphone: 272 ng/mL — ABNORMAL HIGH (ref <50)
Marijuana Metabolite: NEGATIVE ng/mL (ref <20)
Morphine: NEGATIVE ng/mL (ref <50)
Norhydrocodone: 6106 ng/mL — ABNORMAL HIGH (ref <50)
Opiates: POSITIVE ng/mL — AB (ref <100)
Oxycodone: NEGATIVE ng/mL (ref <100)
Tramadol: NEGATIVE ng/mL (ref <100)

## 2023-08-25 LAB — DM TEMPLATE

## 2023-08-28 ENCOUNTER — Other Ambulatory Visit: Payer: Self-pay | Admitting: Family Medicine

## 2023-08-29 DIAGNOSIS — F331 Major depressive disorder, recurrent, moderate: Secondary | ICD-10-CM | POA: Diagnosis not present

## 2023-09-01 ENCOUNTER — Other Ambulatory Visit: Payer: Self-pay | Admitting: Family Medicine

## 2023-09-10 ENCOUNTER — Other Ambulatory Visit: Payer: Self-pay | Admitting: Family Medicine

## 2023-09-10 NOTE — Telephone Encounter (Signed)
 Requesting: alprazolam  Contract: 08/23/23 UDS: 08/23/23 Last Visit: 08/23/23 Next Visit: 11/26/23 Last Refill: 02/09/23 (180,1)  Requesting: hydrocodone  Contract: 08/23/23 UDS: 08/23/23 Last Visit: 08/23/23 Next Visit: 11/26/23 Last Refill: 07/12/23 (90,0) 30 d/s  Please Advise. Rx pending

## 2023-09-13 ENCOUNTER — Ambulatory Visit
Admission: RE | Admit: 2023-09-13 | Discharge: 2023-09-13 | Disposition: A | Discharge status: Home / Self Care | Source: Ambulatory Visit | Attending: Family Medicine | Admitting: Family Medicine

## 2023-09-13 ENCOUNTER — Other Ambulatory Visit: Payer: Self-pay | Admitting: Family Medicine

## 2023-09-13 DIAGNOSIS — Z1231 Encounter for screening mammogram for malignant neoplasm of breast: Secondary | ICD-10-CM | POA: Diagnosis not present

## 2023-09-14 ENCOUNTER — Encounter: Payer: Self-pay | Admitting: Family Medicine

## 2023-09-14 ENCOUNTER — Ambulatory Visit: Payer: Self-pay

## 2023-09-14 NOTE — Telephone Encounter (Signed)
 FYI Only or Action Required?: FYI only for provider.  Patient was last seen in primary care on 08/23/2023 by McGowen, Aleene DEL, MD.  Called Nurse Triage reporting Immunizations.  Symptoms began several weeks ago.  Interventions attempted: Nothing.  Symptoms are: unchanged.  Triage Disposition: See PCP When Office is Open (Within 3 Days)  Patient/caregiver understands and will follow disposition?: Unsure         Copied from CRM #[id number]. Topic: Clinical - Red Word Triage >> Sep 14, 2023  2:30 PM [name] wrote: Red Word that prompted transfer to Nurse Triage: pt calling due to receiving pneumonia shot from office and has pain in arm. still warm to the touch and can not fully lift arm without pain Reason for Disposition  [1] Pain, tenderness, or swelling at the injection site AND [2] persists > 3 days  Answer Assessment - Initial Assessment Questions 1. SYMPTOMS: What is the main symptom? (e.g., pain, redness, or swelling at injection site; feeling tired, fever, muscle aches)      Pain, warmth, limited ROM 2. ONSET: When was the vaccine (shot) given? How much later did the sx begin? (e.g., hours, days ago)      3 weeks ago 3. SEVERITY: How bad is it?      Cannot lift arm  4. FEVER: Do you have a fever? If Yes, ask: What is your temperature, how was it measured, and when did it start?      denies 5. IMMUNIZATIONS GIVEN: What shots have you recently received?     PNA shot Prevnar 20 6. PAST REACTIONS: Have you reacted to immunizations before? If Yes, ask: What happened?     denies 7. OTHER SYMPTOMS: Do you have any other symptoms?     denies Thinks SIRVA? - pt reports she looked up the side effects of  vaccine 8. PREGNANCY: Is there any chance you are pregnant? When was your last menstrual period?     N/a    Triager attempted to schedule with alternate PCP, pt refused and said that she'll go to a specialist.  Protocols used: Immunization  Reactions-A-AH

## 2023-09-16 NOTE — Telephone Encounter (Signed)
 Please call Jacqueline Vance and arrange an appointment at her earliest convenience (work in) to address this.

## 2023-09-18 NOTE — Telephone Encounter (Signed)
 No further action needed.

## 2023-09-18 NOTE — Telephone Encounter (Signed)
 Spoke to Jacqueline Vance, she is scheduled for tomorrow 9/3 at 11:20AM

## 2023-09-19 ENCOUNTER — Ambulatory Visit (INDEPENDENT_AMBULATORY_CARE_PROVIDER_SITE_OTHER): Admitting: Family Medicine

## 2023-09-19 ENCOUNTER — Encounter: Payer: Self-pay | Admitting: Family Medicine

## 2023-09-19 VITALS — BP 112/72 | HR 56 | Temp 97.8°F | Ht 63.0 in | Wt 151.0 lb

## 2023-09-19 DIAGNOSIS — T50Z95A Adverse effect of other vaccines and biological substances, initial encounter: Secondary | ICD-10-CM

## 2023-09-19 DIAGNOSIS — S4981XA Other specified injuries of right shoulder and upper arm, initial encounter: Secondary | ICD-10-CM | POA: Diagnosis not present

## 2023-09-19 DIAGNOSIS — M25511 Pain in right shoulder: Secondary | ICD-10-CM | POA: Diagnosis not present

## 2023-09-19 DIAGNOSIS — M7551 Bursitis of right shoulder: Secondary | ICD-10-CM

## 2023-09-19 DIAGNOSIS — S4980XA Other specified injuries of shoulder and upper arm, unspecified arm, initial encounter: Secondary | ICD-10-CM

## 2023-09-19 MED ORDER — TRIAMCINOLONE ACETONIDE 40 MG/ML IJ SUSP
40.0000 mg | Freq: Once | INTRAMUSCULAR | Status: AC
  Administered 2023-09-19: 40 mg via INTRA_ARTICULAR

## 2023-09-19 NOTE — Progress Notes (Signed)
 OFFICE VISIT  09/19/2023  CC:  Chief Complaint  Patient presents with   Shoulder Pain    Right; still has decreased ROM    Patient is a 63 y.o. female who presents for right shoulder pain.  HPI:  On 08/23/2023 Jacqueline Vance received the pneumonia vaccine in the right deltoid.  She says she recalls it being given high up into the arm, higher than usual, hurt more than usual.  Since that time it has continued to hurt quite a bit, points to the anterolateral acromion region.  Her range of motion is decreased. No prior history of any shoulder problem. She has no neck pain.  No tingling or numbness in her arm. Occasionally the pain will extend down over the right deltoid.  Past Medical History:  Diagnosis Date   Adult ADHD    Anxiety    Aortic atherosclerosis (HCC) 08/2016   Chronic back pain    neck, too.  MRI L spine 05/2008: 40 degrees dextroscoliosis due to a hemivertebra of L3.  Some DDD/spondylosis w/out spinal stenosis.   Colon cancer screening    04/2021 Cologuard negative.   Complication of anesthesia    drop in BP, difficult to wake up, shallow breathing   Depression    Diastolic dysfunction, left ventricle 05/2018   Echo with grd I DD.   Dry eye syndrome    GERD (gastroesophageal reflux disease)    Gout    2 attacks total as of 12/2014.   Heart murmur    Kidney stones 2005; 2017; 2018   Most recent CT 08/2016 showed 3 mm nonobstructing L nephrolithiasis.   Migraine    Proph tx with topamax  helps, abortive tx with relpax  helps.  Couldn't afford topamax  so d/c'd this 04/2016.     Navicular fracture, foot 03/2018   Right Navicular fx-nonunion (surgery)   Pancreatic lesion    10/2021 6mm cystic lesion, stable 03/2022 and 04/2023-->no repeat is needed   Pulmonary nodule 2018   5 mm LLL, stable on 2 yr f/u imaging 2022-->benign, no further imaging needed.   Renal cancer, left (HCC)    partial nephrectomy 06/07/21   Restless legs syndrome    Pt can get to sleep fine so she declines  med for this as of 08/2016    Past Surgical History:  Procedure Laterality Date   BREAST SURGERY  1999   reduction   ENDOMETRIAL ABLATION  2002   FOOT FRACTURE SURGERY  03/2018   Closed displaced fracture of right navicular, nonunion.   KIDNEY STONE SURGERY  2005   LAPAROSCOPIC VAGINAL HYSTERECTOMY  12/02/2007   for DUB   LUMBAR ESI  2021   no help ([facility])   REDUCTION MAMMAPLASTY     ROBOTIC ASSITED PARTIAL NEPHRECTOMY Left 06/07/2021   Procedure: XI ROBOTIC ASSITED PARTIAL NEPHRECTOMY;  Surgeon: Devere Lonni Righter, MD;  Location: [facility];  Service: Urology;  Laterality: Left;   TRANSTHORACIC ECHOCARDIOGRAM  06/13/2018   Grd I DD   WISDOM TOOTH EXTRACTION      Outpatient Medications Prior to Visit  Medication Sig Dispense Refill   albuterol  (VENTOLIN  HFA) 108 (90 Base) MCG/ACT inhaler Inhale 2 puffs into the lungs every 4 (four) hours as needed for wheezing or shortness of breath. 18 g 0   ALPRAZolam  (XANAX ) 1 MG tablet TAKE ONE TABLET BY MOUTH TWICE A DAY AS NEEDED FOR ANXIETY 180 tablet 1   citalopram  (CELEXA ) 40 MG tablet TAKE ONE (1) TABLET BY MOUTH EVERY DAY 90 tablet 0  Cyanocobalamin (VITAMIN B-12 PO) Take 1 tablet by mouth daily. Reported on 04/07/2015     eletriptan  (RELPAX ) 40 MG tablet TAKE 1 TABLET BY MOUTH AT ONSET OF HEADACHE. MAY REPEAT IN 2 HOURS IF HEADACHE PERSIST OR RECURS 18 tablet 1   estradiol (ESTRACE) 0.1 MG/GM vaginal cream Place 1 Applicatorful vaginally.     fluorometholone (FML) 0.1 % ophthalmic suspension SMARTSIG:In Eye(s)     HYDROcodone -acetaminophen  (NORCO/VICODIN) 5-325 MG tablet TAKE ONE TABLET BY MOUTH THREE TIMES DAILY AS NEEDED 90 tablet 0   ibuprofen  (ADVIL ) 600 MG tablet TAKE ONE TABLET (600MG  TOTAL) BY MOUTH EVERY 8 HOURS AS NEEDED 30 tablet 3   pantoprazole  (PROTONIX ) 40 MG tablet TAKE ONE (1) TABLET BY MOUTH EVERY DAY 90 tablet 1   tretinoin  (RETIN-A ) 0.025 % cream Apply 1 application topically at bedtime as needed. For acne  prevention/control 45 g 3   cycloSPORINE (CEQUA OP) Apply to eye in the morning and at bedtime. (Patient not taking: Reported on 09/19/2023)     fluorouracil  (EFUDEX ) 5 % cream Apply to areas of sun damage on the face twice daily for 4-5 days (Patient not taking: Reported on 09/19/2023) 40 g 6   lidocaine  (LIDODERM ) 5 % 1 patch to the affected area q12h prn (Patient not taking: Reported on 09/19/2023) 30 patch 0   methocarbamol  (ROBAXIN ) 500 MG tablet TAKE 1 OR 2 TABLETS BY MOUTH EVERY 6 HOURS AS NEEDED FOR MUSCULAR RELAXATION (Patient not taking: Reported on 09/19/2023) 30 tablet 6   tiZANidine  (ZANAFLEX ) 4 MG tablet 1 tab po qhs prn (Patient not taking: Reported on 09/19/2023) 90 tablet 1   No facility-administered medications prior to visit.    Allergies  Allergen Reactions   Imitrex  [Sumatriptan ] Other (See Comments)    Face and Arm numbness    Review of Systems  As per HPI  PE:    09/19/2023   10:52 AM 08/23/2023    2:40 PM 02/22/2023    9:16 AM  Vitals with BMI  Height 5' 3 5' 3 5' 3  Weight 151 lbs 151 lbs 13 oz   BMI 26.76 26.9   Systolic 112 99 104  Diastolic 72 62 68  Pulse 56 59 67     Physical Exam  Gen: Alert, well appearing.  Patient is oriented to person, place, time, and situation. AFFECT: pleasant, lucid thought and speech. Right anterolateral acromion region with mild tenderness to palpation.  No warmth, bruising, or swelling of the deltoid muscle. No tenderness over the [facility] joint or the long head of biceps tendon. She can abduct her shoulder to about 90 degrees but then is completely inhibited by pain.  Supraspinatus testing is positive.  Hawkins and Neer's positive.  Speeds negative.  O'Brien's positive.  LABS:  none  IMPRESSION AND PLAN:  Acute right shoulder pain.  Suspect SIRVA (shoulder injury related to vaccine administration). Bedside [facility] ultrasound today showed no abnormality (biceps tendon in groove and normal on long and short views.  Very mild  thickening of subscap and supraspinatus tendons but no hypoechoic changes or fiber disruptions.  No bursal wall hypertrophy or fluid.  Some subtle impingement of peribursal fat on dynamic imaging.  AC joint without significant degenerative changes. Posterior glenohumeral joint appears normal, as does the infraspinatus and teres minor muscles and tendons.  I recommended we move forward with steroid injection today and patient was agreeable to the plan.  Ultrasound-guided injection is preferred based on studies that show increased duration, increased effect, greater  accuracy, decreased procedural pain, increased response rate, and decreased cost with ultrasound-guided versus blind injection. Procedure: Real-time ultrasound guided injection of right subacromial bursa. Device: [employer] informed consent obtained.  Timeout conducted.  No overlying erythema, induration, or other signs of local infection. After sterile prep with Betadine, injected a mixture of 40 mg triamcinolone  and 3 mL of 1% lidocaine  without epi.  Injectate seen filling subacromial bursa.. Patient tolerated the procedure well.  No immediate complications.  Post-injection care discussed. Advised to call if fever/chills, erythema, drainage, or persistent bleeding.  Impression: Technically successful ultrasound-guided injection.  An After Visit Summary was printed and given to the patient.  FOLLOW UP: Return if symptoms worsen or fail to improve.  Signed:  Gerlene Hockey, MD           09/19/2023

## 2023-10-09 ENCOUNTER — Other Ambulatory Visit: Payer: Self-pay | Admitting: Family Medicine

## 2023-10-29 ENCOUNTER — Other Ambulatory Visit: Payer: Self-pay | Admitting: Family Medicine

## 2023-11-09 ENCOUNTER — Other Ambulatory Visit: Payer: Self-pay

## 2023-11-09 ENCOUNTER — Encounter (HOSPITAL_COMMUNITY): Payer: Self-pay

## 2023-11-09 ENCOUNTER — Inpatient Hospital Stay (HOSPITAL_COMMUNITY)
Admission: EM | Admit: 2023-11-09 | Discharge: 2023-11-12 | DRG: 287 | Disposition: A | Discharge status: Home / Self Care | Attending: Internal Medicine | Admitting: Internal Medicine

## 2023-11-09 ENCOUNTER — Emergency Department (HOSPITAL_COMMUNITY)

## 2023-11-09 DIAGNOSIS — I5032 Chronic diastolic (congestive) heart failure: Secondary | ICD-10-CM | POA: Diagnosis present

## 2023-11-09 DIAGNOSIS — I700 Atherosclerosis of aorta: Secondary | ICD-10-CM | POA: Diagnosis present

## 2023-11-09 DIAGNOSIS — M549 Dorsalgia, unspecified: Secondary | ICD-10-CM | POA: Diagnosis present

## 2023-11-09 DIAGNOSIS — Z87442 Personal history of urinary calculi: Secondary | ICD-10-CM

## 2023-11-09 DIAGNOSIS — I3139 Other pericardial effusion (noninflammatory): Secondary | ICD-10-CM | POA: Diagnosis present

## 2023-11-09 DIAGNOSIS — G2581 Restless legs syndrome: Secondary | ICD-10-CM | POA: Diagnosis present

## 2023-11-09 DIAGNOSIS — Z905 Acquired absence of kidney: Secondary | ICD-10-CM | POA: Diagnosis not present

## 2023-11-09 DIAGNOSIS — F909 Attention-deficit hyperactivity disorder, unspecified type: Secondary | ICD-10-CM | POA: Diagnosis present

## 2023-11-09 DIAGNOSIS — G8929 Other chronic pain: Secondary | ICD-10-CM | POA: Diagnosis present

## 2023-11-09 DIAGNOSIS — Z8673 Personal history of transient ischemic attack (TIA), and cerebral infarction without residual deficits: Secondary | ICD-10-CM

## 2023-11-09 DIAGNOSIS — R011 Cardiac murmur, unspecified: Secondary | ICD-10-CM | POA: Diagnosis present

## 2023-11-09 DIAGNOSIS — R778 Other specified abnormalities of plasma proteins: Secondary | ICD-10-CM | POA: Diagnosis not present

## 2023-11-09 DIAGNOSIS — R079 Chest pain, unspecified: Secondary | ICD-10-CM

## 2023-11-09 DIAGNOSIS — G43909 Migraine, unspecified, not intractable, without status migrainosus: Secondary | ICD-10-CM | POA: Diagnosis present

## 2023-11-09 DIAGNOSIS — Z743 Need for continuous supervision: Secondary | ICD-10-CM | POA: Diagnosis not present

## 2023-11-09 DIAGNOSIS — Z90710 Acquired absence of both cervix and uterus: Secondary | ICD-10-CM | POA: Diagnosis not present

## 2023-11-09 DIAGNOSIS — Z888 Allergy status to other drugs, medicaments and biological substances status: Secondary | ICD-10-CM

## 2023-11-09 DIAGNOSIS — R7989 Other specified abnormal findings of blood chemistry: Secondary | ICD-10-CM | POA: Diagnosis present

## 2023-11-09 DIAGNOSIS — K219 Gastro-esophageal reflux disease without esophagitis: Secondary | ICD-10-CM | POA: Diagnosis present

## 2023-11-09 DIAGNOSIS — M109 Gout, unspecified: Secondary | ICD-10-CM | POA: Diagnosis present

## 2023-11-09 DIAGNOSIS — I252 Old myocardial infarction: Secondary | ICD-10-CM

## 2023-11-09 DIAGNOSIS — Z79899 Other long term (current) drug therapy: Secondary | ICD-10-CM | POA: Diagnosis not present

## 2023-11-09 DIAGNOSIS — I214 Non-ST elevation (NSTEMI) myocardial infarction: Secondary | ICD-10-CM | POA: Diagnosis present

## 2023-11-09 DIAGNOSIS — F419 Anxiety disorder, unspecified: Secondary | ICD-10-CM | POA: Diagnosis present

## 2023-11-09 DIAGNOSIS — Z85528 Personal history of other malignant neoplasm of kidney: Secondary | ICD-10-CM

## 2023-11-09 DIAGNOSIS — I110 Hypertensive heart disease with heart failure: Secondary | ICD-10-CM | POA: Diagnosis present

## 2023-11-09 DIAGNOSIS — Z7982 Long term (current) use of aspirin: Secondary | ICD-10-CM

## 2023-11-09 DIAGNOSIS — R0789 Other chest pain: Principal | ICD-10-CM | POA: Diagnosis present

## 2023-11-09 DIAGNOSIS — I2510 Atherosclerotic heart disease of native coronary artery without angina pectoris: Secondary | ICD-10-CM | POA: Diagnosis present

## 2023-11-09 DIAGNOSIS — I959 Hypotension, unspecified: Secondary | ICD-10-CM | POA: Diagnosis not present

## 2023-11-09 DIAGNOSIS — E785 Hyperlipidemia, unspecified: Secondary | ICD-10-CM | POA: Diagnosis not present

## 2023-11-09 LAB — D-DIMER, QUANTITATIVE: D-Dimer, Quant: 0.54 ug{FEU}/mL — ABNORMAL HIGH (ref 0.00–0.50)

## 2023-11-09 LAB — BASIC METABOLIC PANEL WITH GFR
Anion gap: 10 (ref 5–15)
BUN: 8 mg/dL (ref 8–23)
CO2: 26 mmol/L (ref 22–32)
Calcium: 8.9 mg/dL (ref 8.9–10.3)
Chloride: 102 mmol/L (ref 98–111)
Creatinine, Ser: 0.73 mg/dL (ref 0.44–1.00)
GFR, Estimated: >60 mL/min (ref >60)
Glucose, Bld: 120 mg/dL — ABNORMAL HIGH (ref 70–99)
Potassium: 3.8 mmol/L (ref 3.5–5.1)
Sodium: 137 mmol/L (ref 135–145)

## 2023-11-09 LAB — HEPATIC FUNCTION PANEL
ALT: 10 U/L (ref 0–44)
AST: 22 U/L (ref 15–41)
Albumin: 4 g/dL (ref 3.5–5.0)
Alkaline Phosphatase: 72 U/L (ref 38–126)
Bilirubin, Direct: 0.1 mg/dL (ref 0.0–0.2)
Indirect Bilirubin: 0.1 mg/dL — ABNORMAL LOW (ref 0.3–0.9)
Total Bilirubin: 0.2 mg/dL (ref 0.0–1.2)
Total Protein: 6.3 g/dL — ABNORMAL LOW (ref 6.5–8.1)

## 2023-11-09 LAB — CBC
HCT: 37.4 % (ref 36.0–46.0)
Hemoglobin: 12.7 g/dL (ref 12.0–15.0)
MCH: 30.8 pg (ref 26.0–34.0)
MCHC: 34 g/dL (ref 30.0–36.0)
MCV: 90.8 fL (ref 80.0–100.0)
Platelets: 213 K/uL (ref 150–400)
RBC: 4.12 MIL/uL (ref 3.87–5.11)
RDW: 12 % (ref 11.5–15.5)
WBC: 6.1 K/uL (ref 4.0–10.5)
nRBC: 0 % (ref 0.0–0.2)

## 2023-11-09 LAB — TROPONIN T, HIGH SENSITIVITY
Troponin T High Sensitivity: 17 ng/L (ref 0–19)
Troponin T High Sensitivity: 60 ng/L — ABNORMAL HIGH (ref 0–19)

## 2023-11-09 MED ORDER — DIPHENHYDRAMINE HCL 50 MG/ML IJ SOLN
25.0000 mg | Freq: Once | INTRAMUSCULAR | Status: AC
  Administered 2023-11-09: 25 mg via INTRAVENOUS
  Filled 2023-11-09: qty 1

## 2023-11-09 MED ORDER — MORPHINE SULFATE (PF) 4 MG/ML IV SOLN
2.0000 mg | Freq: Once | INTRAVENOUS | Status: AC
  Administered 2023-11-09: 2 mg via INTRAVENOUS
  Filled 2023-11-09: qty 1

## 2023-11-09 MED ORDER — HEPARIN BOLUS VIA INFUSION
2000.0000 [IU] | Freq: Once | INTRAVENOUS | Status: AC
  Administered 2023-11-09: 2000 [IU] via INTRAVENOUS

## 2023-11-09 MED ORDER — HEPARIN (PORCINE) 25000 UT/250ML-% IV SOLN
1000.0000 [IU]/h | INTRAVENOUS | Status: DC
  Administered 2023-11-09: 800 [IU]/h via INTRAVENOUS
  Administered 2023-11-11: 1000 [IU]/h via INTRAVENOUS
  Filled 2023-11-09 (×2): qty 250

## 2023-11-09 MED ORDER — METOCLOPRAMIDE HCL 5 MG/ML IJ SOLN
10.0000 mg | Freq: Once | INTRAMUSCULAR | Status: AC
  Administered 2023-11-09: 10 mg via INTRAVENOUS
  Filled 2023-11-09: qty 2

## 2023-11-09 MED ORDER — NITROGLYCERIN IN D5W 200-5 MCG/ML-% IV SOLN
5.0000 ug/min | INTRAVENOUS | Status: DC
  Administered 2023-11-09: 5 ug/min via INTRAVENOUS
  Filled 2023-11-09: qty 250

## 2023-11-09 MED ORDER — LACTATED RINGERS IV BOLUS
1000.0000 mL | Freq: Once | INTRAVENOUS | Status: AC
  Administered 2023-11-09: 1000 mL via INTRAVENOUS

## 2023-11-09 NOTE — ED Provider Notes (Signed)
  Provider Note MRN:  [id number]  Arrival date & time: 11/10/23    ED Course and Medical Decision Making  Assumed care of patient at sign-out or upon transfer.  Chest pain, troponin rising, will discuss with cards, will need admission.  12 AM update: Case discussed with Dr. Orlando of cardiology, will plan for hospitalist admission at [facility]  .Critical Care  Performed by: Theadore Ozell HERO, MD Authorized by: Theadore Ozell HERO, MD   Critical care provider statement:    Critical care time (minutes):  32   Critical care was necessary to treat or prevent imminent or life-threatening deterioration of the following conditions: NSTEMI.   Critical care was time spent personally by me on the following activities:  Development of treatment plan with patient or surrogate, discussions with consultants, evaluation of patient's response to treatment, examination of patient, ordering and review of laboratory studies, ordering and review of radiographic studies, ordering and performing treatments and interventions, pulse oximetry, re-evaluation of patient's condition and review of old charts   Final Clinical Impressions(s) / ED Diagnoses     ICD-10-CM   1. Elevated troponin  R79.89     2. Chest pain, unspecified type  R07.9       ED Discharge Orders     None       Discharge Instructions   None     Ozell HERO. Theadore, MD [facility] Emergency Medicine [facility] [email or web address]    Theadore Ozell HERO, MD 11/10/23 MIKI

## 2023-11-09 NOTE — ED Notes (Signed)
 Patient transported to X-ray

## 2023-11-09 NOTE — Progress Notes (Signed)
 PHARMACY - ANTICOAGULATION CONSULT NOTE  Pharmacy Consult for heparin Indication: chest pain/ACS  Allergies  Allergen Reactions   Imitrex  [Sumatriptan ] Other (See Comments)    Face and Arm numbness    Patient Measurements: Height: 5' 3 (160 cm) Weight: 68.5 kg (151 lb 0.2 oz) IBW/kg (Calculated) : 52.4 HEPARIN DW (KG): 66.4  Vital Signs: Temp: 97.8 F (36.6 C) (10/24 2000) Temp Source: Oral (10/24 2000) BP: 138/86 (10/24 2245) Pulse Rate: 60 (10/24 2245)  Labs: Recent Labs    11/09/23 2007  HGB 12.7  HCT 37.4  PLT 213  CREATININE 0.73    Estimated Creatinine Clearance: 68.5 mL/min (by C-G formula based on SCr of 0.73 mg/dL).   Medical History: Past Medical History:  Diagnosis Date   Adult ADHD    Anxiety    Aortic atherosclerosis 08/2016   Chronic back pain    neck, too.  MRI L spine 05/2008: 40 degrees dextroscoliosis due to a hemivertebra of L3.  Some DDD/spondylosis w/out spinal stenosis.   Colon cancer screening    04/2021 Cologuard negative.   Complication of anesthesia    drop in BP, difficult to wake up, shallow breathing   Depression    Diastolic dysfunction, left ventricle 05/2018   Echo with grd I DD.   Dry eye syndrome    GERD (gastroesophageal reflux disease)    Gout    2 attacks total as of 12/2014.   Heart murmur    Kidney stones 2005; 2017; 2018   Most recent CT 08/2016 showed 3 mm nonobstructing L nephrolithiasis.   Migraine    Proph tx with topamax  helps, abortive tx with relpax  helps.  Couldn't afford topamax  so d/c'd this 04/2016.     Navicular fracture, foot 03/2018   Right Navicular fx-nonunion (surgery)   Pancreatic lesion    10/2021 6mm cystic lesion, stable 03/2022 and 04/2023-->no repeat is needed   Pulmonary nodule 2018   5 mm LLL, stable on 2 yr f/u imaging 2022-->benign, no further imaging needed.   Renal cancer, left (HCC)    partial nephrectomy 06/07/21   Restless legs syndrome    Pt can get to sleep fine so she declines  med for this as of 08/2016    Assessment: 63yo female c/o chest pressure, initial troponin negative but now rising >> to begin heparin.  Goal of Therapy:  Heparin level 0.3-0.7 units/ml Monitor platelets by anticoagulation protocol: Yes   Plan:  Heparin 2000 units IV bolus followed by infusion at 800 units/hr. Monitor heparin levels and CBC.  Marvetta Dauphin, PharmD, BCPS  11/09/2023,11:36 PM

## 2023-11-09 NOTE — ED Triage Notes (Signed)
 [facility] from home Cc of left side chest pressure Worsening after cleaning up in restroom  Slight elevation in V2-V3 in EMS EKG 324 mg aspiring 2 ngt with relief  Slightly hypertensive with ems but went down after ntg 112/72 20g L Wrist 124 CBG 3/10 pain  Able to ambulate from stretcher to bed

## 2023-11-09 NOTE — ED Provider Notes (Signed)
 [place] EMERGENCY DEPARTMENT AT [facility] Provider Note   CSN: [id number] Arrival date & time: 11/09/23  1951     History  Chief Complaint  Patient presents with   Chest Pain    Jacqueline Vance is a 63 y.o. female with RCC s/p L nephrectomy, RLS, gout, G1DD on echo 2020, chronic back pain, kidney stones, GERD who presents with chest pain that occurred suddenly while she was cleaning the house approx 1.5 hours ago. In her left arm/left chest. This has been happening intermittently for a few weeks but today's was very severe. +difficulty breathing. Was tearful earlier it was so painful but now rated 3/10. No nausea/vomiting, f/c, cough, flu-like sxs, leg swelling. No h/o DVT/PE, no recent travel/hospitalizations/surgeries. Doesn't take hormones or blood thinners. Had echo 2020 which showed G1DD. Born w/ heart murmur but states it closed up.  She received 4 baby aspirin and 2 sublingual nitroglycerin with EMS that improved her pain.   Past Medical History:  Diagnosis Date   Adult ADHD    Anxiety    Aortic atherosclerosis 08/2016   Chronic back pain    neck, too.  MRI L spine 05/2008: 40 degrees dextroscoliosis due to a hemivertebra of L3.  Some DDD/spondylosis w/out spinal stenosis.   Colon cancer screening    04/2021 Cologuard negative.   Complication of anesthesia    drop in BP, difficult to wake up, shallow breathing   Depression    Diastolic dysfunction, left ventricle 05/2018   Echo with grd I DD.   Dry eye syndrome    GERD (gastroesophageal reflux disease)    Gout    2 attacks total as of 12/2014.   Heart murmur    Kidney stones 2005; 2017; 2018   Most recent CT 08/2016 showed 3 mm nonobstructing L nephrolithiasis.   Migraine    Proph tx with topamax  helps, abortive tx with relpax  helps.  Couldn't afford topamax  so d/c'd this 04/2016.     Navicular fracture, foot 03/2018   Right Navicular fx-nonunion (surgery)   Pancreatic lesion    10/2021 6mm cystic  lesion, stable 03/2022 and 04/2023-->no repeat is needed   Pulmonary nodule 2018   5 mm LLL, stable on 2 yr f/u imaging 2022-->benign, no further imaging needed.   Renal cancer, left (HCC)    partial nephrectomy 06/07/21   Restless legs syndrome    Pt can get to sleep fine so she declines med for this as of 08/2016       Home Medications Prior to Admission medications   Medication Sig Start Date End Date Taking? Authorizing Provider  ALPRAZolam  (XANAX ) 1 MG tablet TAKE ONE TABLET BY MOUTH TWICE A DAY AS NEEDED FOR ANXIETY Patient taking differently: Take 1 mg by mouth 2 (two) times daily as needed for anxiety. TAKE ONE TABLET BY MOUTH TWICE A DAY AS NEEDED FOR ANXIETY 09/10/23  Yes McGowen, Aleene DEL, MD  citalopram  (CELEXA ) 40 MG tablet TAKE ONE (1) TABLET BY MOUTH EVERY DAY Patient taking differently: Take 40 mg by mouth every evening. 09/13/23  Yes McGowen, Aleene DEL, MD  Cyanocobalamin (VITAMIN B-12 PO) Take 1 tablet by mouth daily. Reported on 04/07/2015   Yes [provider]  eletriptan  (RELPAX ) 40 MG tablet TAKE 1 TABLET BY MOUTH  AT  ONSET  OF  HEADACHE.  MAY  REPEAT  IN  2  HOURS  IF  HEADACHE  PERSISITS  OR  RECURS Patient taking differently: Take 40 mg by mouth  every 2 (two) hours as needed for headache or migraine. TAKE 1 TABLET BY MOUTH  AT  ONSET  OF  HEADACHE.  MAY  REPEAT  IN  2  HOURS  IF  HEADACHE  PERSISITS  OR  RECURS 10/30/23  Yes McGowen, Aleene DEL, MD  fluorometholone (FML) 0.1 % ophthalmic suspension Place 2 drops into the right eye 3 (three) times daily. 09/09/21  Yes [provider]  HYDROcodone -acetaminophen  (NORCO/VICODIN) 5-325 MG tablet TAKE ONE TABLET BY MOUTH THREE TIMES DAILY AS NEEDED Patient taking differently: Take 1 tablet by mouth 3 (three) times daily as needed for moderate pain (pain score 4-6). 09/10/23  Yes McGowen, Aleene DEL, MD  pantoprazole  (PROTONIX ) 40 MG tablet TAKE ONE (1) TABLET BY MOUTH EVERY DAY Patient taking differently: Take 40 mg  by mouth at bedtime. 08/13/23  Yes McGowen, Aleene DEL, MD  tretinoin  (RETIN-A ) 0.025 % cream Apply 1 application topically at bedtime as needed. For acne prevention/control 10/06/20  Yes McGowen, Aleene DEL, MD      Allergies    Imitrex  [sumatriptan ]    Review of Systems   Review of Systems A 10 point review of systems was performed and is negative unless otherwise reported in HPI.  Physical Exam Updated Vital Signs BP 138/86   Pulse 60   Temp 97.8 F (36.6 C) (Oral)   Resp 14   Ht 5' 3 (1.6 m)   Wt 68.5 kg   SpO2 96%   BMI 26.75 kg/m  Physical Exam General: Normal appearing female, lying in bed.  HEENT: PERRLA, Sclera anicteric, MMM, trachea midline.  Cardiology: RRR, no murmurs/rubs/gallops. BL radial and DP pulses equal bilaterally.  Resp: Normal respiratory rate and effort. CTAB, no wheezes, rhonchi, crackles.  Abd: Soft, non-tender, non-distended. No rebound tenderness or guarding.  GU: Deferred. MSK: No peripheral edema or signs of trauma. Extremities without deformity or TTP. No cyanosis or clubbing. Skin: warm, dry. Neuro: A&Ox4, CNs II-XII grossly intact. MAEs. Sensation grossly intact.  Psych: Normal mood and affect.   ED Results / Procedures / Treatments   Labs (all labs ordered are listed, but only abnormal results are displayed) Labs Reviewed  BASIC METABOLIC PANEL WITH GFR - Abnormal; Notable for the following components:      Result Value   Glucose, Bld 120 (*)    All other components within normal limits  D-DIMER, QUANTITATIVE - Abnormal; Notable for the following components:   D-Dimer, Quant 0.54 (*)    All other components within normal limits  HEPATIC FUNCTION PANEL - Abnormal; Notable for the following components:   Total Protein 6.3 (*)    Indirect Bilirubin 0.1 (*)    All other components within normal limits  TROPONIN T, HIGH SENSITIVITY - Abnormal; Notable for the following components:   Troponin T High Sensitivity 60 (*)    All other components  within normal limits  CBC  TROPONIN T, HIGH SENSITIVITY    EKG EKG Interpretation Date/Time:  Friday November 09 2023 20:04:48 EDT Ventricular Rate:  60 PR Interval:  162 QRS Duration:  94 QT Interval:  422 QTC Calculation: 422 R Axis:   25  Text Interpretation: Sinus rhythm No prior for comparison Confirmed by Franklyn Gills [phone]) on 11/09/2023 9:46:23 PM  Radiology DG Chest 2 View Result Date: 11/09/2023 EXAM: 2 VIEW(S) XRAY OF THE CHEST 11/09/2023 08:51:00 PM COMPARISON: 10/20/2021 CLINICAL HISTORY: chest pain. Chest pain started around 7:00 pm today 10/24. FINDINGS: LUNGS AND PLEURA: No focal pulmonary opacity. No pulmonary  edema. No pleural effusion. No pneumothorax. HEART AND MEDIASTINUM: No acute abnormality of the cardiac and mediastinal silhouettes. BONES AND SOFT TISSUES: No acute osseous abnormality. IMPRESSION: 1. No acute process. Electronically signed by: Greig Pique MD 11/09/2023 08:57 PM EDT RP Workstation: [id number]    Procedures .Critical Care  Performed by: Franklyn Sid SAILOR, MD Authorized by: Franklyn Sid SAILOR, MD   Critical care provider statement:    Critical care time (minutes):  30   Critical care was necessary to treat or prevent imminent or life-threatening deterioration of the following conditions:  Cardiac failure   Critical care was time spent personally by me on the following activities:  Development of treatment plan with patient or surrogate, discussions with consultants, evaluation of patient's response to treatment, examination of patient, ordering and review of laboratory studies, ordering and review of radiographic studies, ordering and performing treatments and interventions, pulse oximetry, re-evaluation of patient's condition and review of old charts     Medications Ordered in ED Medications  nitroGLYCERIN 50 mg in dextrose  5 % 250 mL (0.2 mg/mL) infusion (has no administration in time range)  morphine  (PF) 4 MG/ML injection 2 mg (2 mg  Intravenous Given 11/09/23 2150)  metoCLOPramide (REGLAN) injection 10 mg (10 mg Intravenous Given 11/09/23 2251)  diphenhydrAMINE  (BENADRYL ) injection 25 mg (25 mg Intravenous Given 11/09/23 2251)  lactated ringers  bolus 1,000 mL (1,000 mLs Intravenous New Bag/Given 11/09/23 2251)    ED Course/ Medical Decision Making/ A&P                          Medical Decision Making Amount and/or Complexity of Data Reviewed Labs: ordered. Decision-making details documented in ED Course. Radiology: ordered. Decision-making details documented in ED Course.  Risk Prescription drug management. Decision regarding hospitalization.    This patient presents to the ED for concern of chest pain, this involves an extensive number of treatment options, and is a complaint that carries with it a high risk of complications and morbidity.  I considered the following differential and admission for this acute, potentially life threatening condition. Pt is overall well-appearing and HDS.   MDM:    DDX for chest pain includes but is not limited to:  Patient with several weeks of intermittent left-sided chest pain rating into the left arm that was specifically worse today, no associated nausea or vomiting or diaphoresis but did have some shortness of breath at that time.  t her pain did improve with the nitroglycerin given sublingually by EMS and her pain is only mild now.  Consider ACS.  EKG without any signs of STEMI will get troponin.  She cannot PERC out due to age but she has no risk factors for PE, no signs or symptoms of DVT on exam, no hypoxia or tachypnea, and dimer is negative.  Lower concern for aortic dissection.  Chest x-ray does not show any acute processes like PTX or PNA.  No abdominal pain and no c/f biliary disease.    Clinical Course as of 11/09/23 2320  Fri Nov 09, 2023  2145 D-Dimer, Quant(!): 0.54 Negative age-adjusted d-dimer [HN]  2146 DG Chest 2 View 1. No acute process. [HN]  2146  Troponin T High Sensitivity: 17 [HN]  2146 CBC wnl [HN]  2146 Basic metabolic panel(!) wnl [HN]  2243 Reporting HA from nitroglycerin w/ EMS, will give HA cocktail. CP now reported only as soreness. Awaiting delta troponin [HN]  2303 Troponin T High Sensitivity(!): 60 Elevated trop.  17 --> 60. Ordered nitroglycerin gtt and consulted to cardiology.  [HN]    Clinical Course User Index [HN] Franklyn Sid SAILOR, MD    Labs: I Ordered, and personally interpreted labs.  The pertinent results include:  those listed above  Imaging Studies ordered: I ordered imaging studies including CXR I independently visualized and interpreted imaging. I agree with the radiologist interpretation  Additional history obtained from chart review, family at bedside.    Cardiac Monitoring: The patient was maintained on a cardiac monitor.  I personally viewed and interpreted the cardiac monitored which showed an underlying rhythm of: NSR  Reevaluation: After the interventions noted above, I reevaluated the patient and found that they have :improved  Social Determinants of Health: Lives independently  Disposition:  Patient is signed out to the oncoming ED physician Dr. Theadore who is made aware of her history, presentation, exam, workup, and plan. Plan is to consult w/ cardiology and admit.    Co morbidities that complicate the patient evaluation  Past Medical History:  Diagnosis Date   Adult ADHD    Anxiety    Aortic atherosclerosis 08/2016   Chronic back pain    neck, too.  MRI L spine 05/2008: 40 degrees dextroscoliosis due to a hemivertebra of L3.  Some DDD/spondylosis w/out spinal stenosis.   Colon cancer screening    04/2021 Cologuard negative.   Complication of anesthesia    drop in BP, difficult to wake up, shallow breathing   Depression    Diastolic dysfunction, left ventricle 05/2018   Echo with grd I DD.   Dry eye syndrome    GERD (gastroesophageal reflux disease)    Gout    2 attacks  total as of 12/2014.   Heart murmur    Kidney stones 2005; 2017; 2018   Most recent CT 08/2016 showed 3 mm nonobstructing L nephrolithiasis.   Migraine    Proph tx with topamax  helps, abortive tx with relpax  helps.  Couldn't afford topamax  so d/c'd this 04/2016.     Navicular fracture, foot 03/2018   Right Navicular fx-nonunion (surgery)   Pancreatic lesion    10/2021 6mm cystic lesion, stable 03/2022 and 04/2023-->no repeat is needed   Pulmonary nodule 2018   5 mm LLL, stable on 2 yr f/u imaging 2022-->benign, no further imaging needed.   Renal cancer, left (HCC)    partial nephrectomy 06/07/21   Restless legs syndrome    Pt can get to sleep fine so she declines med for this as of 08/2016     Medicines Meds ordered this encounter  Medications   morphine  (PF) 4 MG/ML injection 2 mg    Refill:  0   metoCLOPramide (REGLAN) injection 10 mg   diphenhydrAMINE  (BENADRYL ) injection 25 mg   lactated ringers  bolus 1,000 mL   nitroGLYCERIN 50 mg in dextrose  5 % 250 mL (0.2 mg/mL) infusion    I have reviewed the patients home medicines and have made adjustments as needed  Problem List / ED Course: Problem List Items Addressed This Visit   None Visit Diagnoses       Elevated troponin    -  Primary     Chest pain, unspecified type                       This note was created using dictation software, which may contain spelling or grammatical errors.    Franklyn Sid SAILOR, MD 11/09/23 2322

## 2023-11-10 ENCOUNTER — Encounter (HOSPITAL_COMMUNITY): Payer: Self-pay | Admitting: Internal Medicine

## 2023-11-10 DIAGNOSIS — I700 Atherosclerosis of aorta: Secondary | ICD-10-CM | POA: Diagnosis present

## 2023-11-10 DIAGNOSIS — Z79899 Other long term (current) drug therapy: Secondary | ICD-10-CM | POA: Diagnosis not present

## 2023-11-10 DIAGNOSIS — I214 Non-ST elevation (NSTEMI) myocardial infarction: Secondary | ICD-10-CM | POA: Diagnosis present

## 2023-11-10 DIAGNOSIS — Z888 Allergy status to other drugs, medicaments and biological substances status: Secondary | ICD-10-CM | POA: Diagnosis not present

## 2023-11-10 DIAGNOSIS — I5032 Chronic diastolic (congestive) heart failure: Secondary | ICD-10-CM | POA: Diagnosis present

## 2023-11-10 DIAGNOSIS — I3139 Other pericardial effusion (noninflammatory): Secondary | ICD-10-CM | POA: Diagnosis present

## 2023-11-10 DIAGNOSIS — M549 Dorsalgia, unspecified: Secondary | ICD-10-CM | POA: Diagnosis present

## 2023-11-10 DIAGNOSIS — Z90710 Acquired absence of both cervix and uterus: Secondary | ICD-10-CM | POA: Diagnosis not present

## 2023-11-10 DIAGNOSIS — I110 Hypertensive heart disease with heart failure: Secondary | ICD-10-CM | POA: Diagnosis present

## 2023-11-10 DIAGNOSIS — R7989 Other specified abnormal findings of blood chemistry: Principal | ICD-10-CM | POA: Insufficient documentation

## 2023-11-10 DIAGNOSIS — I252 Old myocardial infarction: Secondary | ICD-10-CM | POA: Diagnosis not present

## 2023-11-10 DIAGNOSIS — R0789 Other chest pain: Secondary | ICD-10-CM | POA: Diagnosis present

## 2023-11-10 DIAGNOSIS — I2510 Atherosclerotic heart disease of native coronary artery without angina pectoris: Secondary | ICD-10-CM | POA: Diagnosis present

## 2023-11-10 DIAGNOSIS — I959 Hypotension, unspecified: Secondary | ICD-10-CM | POA: Diagnosis not present

## 2023-11-10 DIAGNOSIS — R011 Cardiac murmur, unspecified: Secondary | ICD-10-CM | POA: Diagnosis present

## 2023-11-10 DIAGNOSIS — G43909 Migraine, unspecified, not intractable, without status migrainosus: Secondary | ICD-10-CM | POA: Diagnosis present

## 2023-11-10 DIAGNOSIS — F909 Attention-deficit hyperactivity disorder, unspecified type: Secondary | ICD-10-CM | POA: Diagnosis present

## 2023-11-10 DIAGNOSIS — K219 Gastro-esophageal reflux disease without esophagitis: Secondary | ICD-10-CM | POA: Diagnosis present

## 2023-11-10 DIAGNOSIS — M109 Gout, unspecified: Secondary | ICD-10-CM | POA: Diagnosis present

## 2023-11-10 DIAGNOSIS — Z8673 Personal history of transient ischemic attack (TIA), and cerebral infarction without residual deficits: Secondary | ICD-10-CM | POA: Diagnosis not present

## 2023-11-10 DIAGNOSIS — E785 Hyperlipidemia, unspecified: Secondary | ICD-10-CM | POA: Diagnosis not present

## 2023-11-10 DIAGNOSIS — G2581 Restless legs syndrome: Secondary | ICD-10-CM | POA: Diagnosis present

## 2023-11-10 DIAGNOSIS — Z85528 Personal history of other malignant neoplasm of kidney: Secondary | ICD-10-CM | POA: Diagnosis not present

## 2023-11-10 DIAGNOSIS — F419 Anxiety disorder, unspecified: Secondary | ICD-10-CM | POA: Diagnosis present

## 2023-11-10 DIAGNOSIS — R079 Chest pain, unspecified: Secondary | ICD-10-CM | POA: Diagnosis present

## 2023-11-10 DIAGNOSIS — Z87442 Personal history of urinary calculi: Secondary | ICD-10-CM | POA: Diagnosis not present

## 2023-11-10 DIAGNOSIS — Z905 Acquired absence of kidney: Secondary | ICD-10-CM | POA: Diagnosis not present

## 2023-11-10 DIAGNOSIS — Z7982 Long term (current) use of aspirin: Secondary | ICD-10-CM | POA: Diagnosis not present

## 2023-11-10 DIAGNOSIS — G8929 Other chronic pain: Secondary | ICD-10-CM | POA: Diagnosis present

## 2023-11-10 DIAGNOSIS — Z743 Need for continuous supervision: Secondary | ICD-10-CM | POA: Diagnosis not present

## 2023-11-10 LAB — CBC
HCT: 36 % (ref 36.0–46.0)
Hemoglobin: 12.3 g/dL (ref 12.0–15.0)
MCH: 30.7 pg (ref 26.0–34.0)
MCHC: 34.2 g/dL (ref 30.0–36.0)
MCV: 89.8 fL (ref 80.0–100.0)
Platelets: 205 K/uL (ref 150–400)
RBC: 4.01 MIL/uL (ref 3.87–5.11)
RDW: 12.1 % (ref 11.5–15.5)
WBC: 5.7 K/uL (ref 4.0–10.5)
nRBC: 0 % (ref 0.0–0.2)

## 2023-11-10 LAB — COMPREHENSIVE METABOLIC PANEL WITH GFR
ALT: 11 U/L (ref 0–44)
AST: 36 U/L (ref 15–41)
Albumin: 3.8 g/dL (ref 3.5–5.0)
Alkaline Phosphatase: 59 U/L (ref 38–126)
Anion gap: 7 (ref 5–15)
BUN: 6 mg/dL — ABNORMAL LOW (ref 8–23)
CO2: 29 mmol/L (ref 22–32)
Calcium: 8.9 mg/dL (ref 8.9–10.3)
Chloride: 105 mmol/L (ref 98–111)
Creatinine, Ser: 0.67 mg/dL (ref 0.44–1.00)
GFR, Estimated: >60 mL/min (ref >60)
Glucose, Bld: 101 mg/dL — ABNORMAL HIGH (ref 70–99)
Potassium: 4.4 mmol/L (ref 3.5–5.1)
Sodium: 140 mmol/L (ref 135–145)
Total Bilirubin: 0.4 mg/dL (ref 0.0–1.2)
Total Protein: 5.8 g/dL — ABNORMAL LOW (ref 6.5–8.1)

## 2023-11-10 LAB — TROPONIN T, HIGH SENSITIVITY
Troponin T High Sensitivity: 107 ng/L (ref 0–19)
Troponin T High Sensitivity: 444 ng/L (ref 0–19)
Troponin T High Sensitivity: 454 ng/L (ref 0–19)

## 2023-11-10 LAB — MAGNESIUM: Magnesium: 1.9 mg/dL (ref 1.7–2.4)

## 2023-11-10 LAB — HEPARIN LEVEL (UNFRACTIONATED)
Heparin Unfractionated: 0.47 [IU]/mL (ref 0.30–0.70)
Heparin Unfractionated: 0.53 [IU]/mL (ref 0.30–0.70)

## 2023-11-10 LAB — PHOSPHORUS: Phosphorus: 3.1 mg/dL (ref 2.5–4.6)

## 2023-11-10 LAB — HIV ANTIBODY (ROUTINE TESTING W REFLEX): HIV Screen 4th Generation wRfx: NONREACTIVE

## 2023-11-10 MED ORDER — ACETAMINOPHEN 650 MG RE SUPP: 650.0000 mg | Freq: Four times a day (QID) | RECTAL | Status: DC | PRN

## 2023-11-10 MED ORDER — PANTOPRAZOLE SODIUM 40 MG PO TBEC
40.0000 mg | DELAYED_RELEASE_TABLET | Freq: Every day | ORAL | Status: DC
  Administered 2023-11-10 – 2023-11-12 (×3): 40 mg via ORAL
  Filled 2023-11-10 (×3): qty 1

## 2023-11-10 MED ORDER — FREE WATER
500.0000 mL | Freq: Once | Status: AC
  Administered 2023-11-12: 500 mL via ORAL

## 2023-11-10 MED ORDER — ONDANSETRON HCL 4 MG/2ML IJ SOLN: 4.0000 mg | Freq: Four times a day (QID) | INTRAMUSCULAR | Status: DC | PRN

## 2023-11-10 MED ORDER — DIPHENHYDRAMINE HCL 50 MG/ML IJ SOLN
25.0000 mg | Freq: Once | INTRAMUSCULAR | Status: AC
  Administered 2023-11-10: 25 mg via INTRAVENOUS
  Filled 2023-11-10: qty 1

## 2023-11-10 MED ORDER — ELETRIPTAN HYDROBROMIDE 20 MG PO TABS
40.0000 mg | ORAL_TABLET | ORAL | Status: DC | PRN
  Administered 2023-11-11: 40 mg via ORAL
  Filled 2023-11-10 (×2): qty 2

## 2023-11-10 MED ORDER — ASPIRIN 81 MG PO TBEC
81.0000 mg | DELAYED_RELEASE_TABLET | Freq: Every day | ORAL | Status: DC
  Administered 2023-11-10 – 2023-11-12 (×3): 81 mg via ORAL
  Filled 2023-11-10 (×3): qty 1

## 2023-11-10 MED ORDER — ACETAMINOPHEN 325 MG PO TABS: 650.0000 mg | ORAL_TABLET | Freq: Four times a day (QID) | ORAL | Status: DC | PRN

## 2023-11-10 MED ORDER — PROCHLORPERAZINE EDISYLATE 10 MG/2ML IJ SOLN
10.0000 mg | Freq: Once | INTRAMUSCULAR | Status: AC
  Administered 2023-11-10: 10 mg via INTRAVENOUS
  Filled 2023-11-10: qty 2

## 2023-11-10 MED ORDER — ATORVASTATIN CALCIUM 40 MG PO TABS
40.0000 mg | ORAL_TABLET | Freq: Every day | ORAL | Status: DC
  Administered 2023-11-10 – 2023-11-12 (×3): 40 mg via ORAL
  Filled 2023-11-10 (×3): qty 1

## 2023-11-10 MED ORDER — ASPIRIN 81 MG PO CHEW
81.0000 mg | CHEWABLE_TABLET | ORAL | Status: AC
  Administered 2023-11-12: 81 mg via ORAL
  Filled 2023-11-10: qty 1

## 2023-11-10 MED ORDER — ONDANSETRON HCL 4 MG PO TABS: 4.0000 mg | ORAL_TABLET | Freq: Four times a day (QID) | ORAL | Status: DC | PRN

## 2023-11-10 NOTE — Consult Note (Addendum)
 Cardiology Consultation   Patient ID: AMAIRANY SCHUMPERT MRN: [id number]; DOB: [birth year]  Admit date: 11/09/2023 Date of Consult: 11/10/2023  PCP:  Candise Aleene DEL, MD   [facility] Providers Cardiologist: New - lives in [place] Click here to update MD or APP on Care Team, Refresh:1}    Patient Profile: Jacqueline Vance is a 63 y.o. female with a hx of RCC s/p left nephrectomy, GERD, heart murmur, aortic atherosclerosis, anxiety, ADHD, ?TIA, pulmonary nodule, RLS, aortic atherosclerosis who is being seen 11/10/2023 for the evaluation of NSTEMI at the request of Dr. Maree.  History of Present Illness: Jacqueline Vance has no formal cardiac hx. Prior echo ordered outpatient for TIA workup by PCP in 2020 showed EF 60-65%, impaired relaxation. She was reportedly born with a heart murmur that closed up.  She presented to [facility] yesterday with chest pain. She had been noticing intermittent chest pain for 6 months, but this episode was more severe. It developed while cleaning the house and was associated with shortness of breath. No nausea, vomiting, fevers, cough, recent surgery, travel, immobilization. EMS was called. She received 2 SL NTG and 4 baby ASA. At [facility], hsTroponin 17->60->107->454->444, CBC OK, d-dimer 0.54 (wnl adjusted for age), CXR no acute process. EKG showed NSR 60bpm, low voltage otherwise no acute changes, TWI avL.  She received IVF, IV NTG drip, IV heparin -> developed a headache with NTG so this was discontinued and was treated with headache cocktail -> IVF, reglan, compazine. She was transferred to [facility] for further evaluation. She is chest pain free on evaluation.   Past Medical History:  Diagnosis Date   Adult ADHD    Anxiety    Aortic atherosclerosis 08/2016   Chronic back pain    neck, too.  MRI L spine 05/2008: 40 degrees dextroscoliosis due to a hemivertebra of L3.  Some DDD/spondylosis w/out spinal stenosis.   Colon cancer screening    04/2021 Cologuard negative.    Complication of anesthesia    drop in BP, difficult to wake up, shallow breathing   Depression    Diastolic dysfunction, left ventricle 05/2018   Echo with grd I DD.   Dry eye syndrome    GERD (gastroesophageal reflux disease)    Gout    2 attacks total as of 12/2014.   Heart murmur    Kidney stones 2005; 2017; 2018   Most recent CT 08/2016 showed 3 mm nonobstructing L nephrolithiasis.   Migraine    Proph tx with topamax  helps, abortive tx with relpax  helps.  Couldn't afford topamax  so d/c'd this 04/2016.     Navicular fracture, foot 03/2018   Right Navicular fx-nonunion (surgery)   Pancreatic lesion    10/2021 6mm cystic lesion, stable 03/2022 and 04/2023-->no repeat is needed   Pulmonary nodule 2018   5 mm LLL, stable on 2 yr f/u imaging 2022-->benign, no further imaging needed.   Renal cancer, left (HCC)    partial nephrectomy 06/07/21   Restless legs syndrome    Pt can get to sleep fine so she declines med for this as of 08/2016    Past Surgical History:  Procedure Laterality Date   BREAST SURGERY  1999   reduction   ENDOMETRIAL ABLATION  2002   FOOT FRACTURE SURGERY  03/2018   Closed displaced fracture of right navicular, nonunion.   KIDNEY STONE SURGERY  2005   LAPAROSCOPIC VAGINAL HYSTERECTOMY  12/02/2007   for DUB   LUMBAR ESI  2021   no help (  [facility])   REDUCTION MAMMAPLASTY     ROBOTIC ASSITED PARTIAL NEPHRECTOMY Left 06/07/2021   Procedure: XI ROBOTIC ASSITED PARTIAL NEPHRECTOMY;  Surgeon: Devere Lonni Righter, MD;  Location: [facility];  Service: Urology;  Laterality: Left;   TRANSTHORACIC ECHOCARDIOGRAM  06/13/2018   Grd I DD   WISDOM TOOTH EXTRACTION       Home Medications:  Prior to Admission medications   Medication Sig Start Date End Date Taking? Authorizing Provider  ALPRAZolam  (XANAX ) 1 MG tablet TAKE ONE TABLET BY MOUTH TWICE A DAY AS NEEDED FOR ANXIETY Patient taking differently: Take 1 mg by mouth 2 (two) times daily as needed for anxiety. TAKE ONE  TABLET BY MOUTH TWICE A DAY AS NEEDED FOR ANXIETY 09/10/23  Yes McGowen, Aleene DEL, MD  citalopram  (CELEXA ) 40 MG tablet TAKE ONE (1) TABLET BY MOUTH EVERY DAY Patient taking differently: Take 40 mg by mouth every evening. 09/13/23  Yes McGowen, Aleene DEL, MD  Cyanocobalamin (VITAMIN B-12 PO) Take 1 tablet by mouth daily. Reported on 04/07/2015   Yes [provider]  eletriptan  (RELPAX ) 40 MG tablet TAKE 1 TABLET BY MOUTH  AT  ONSET  OF  HEADACHE.  MAY  REPEAT  IN  2  HOURS  IF  HEADACHE  PERSISITS  OR  RECURS Patient taking differently: Take 40 mg by mouth every 2 (two) hours as needed for headache or migraine. TAKE 1 TABLET BY MOUTH  AT  ONSET  OF  HEADACHE.  MAY  REPEAT  IN  2  HOURS  IF  HEADACHE  PERSISITS  OR  RECURS 10/30/23  Yes McGowen, Aleene DEL, MD  HYDROcodone -acetaminophen  (NORCO/VICODIN) 5-325 MG tablet TAKE ONE TABLET BY MOUTH THREE TIMES DAILY AS NEEDED Patient taking differently: Take 1 tablet by mouth 3 (three) times daily as needed for moderate pain (pain score 4-6). 09/10/23  Yes McGowen, Aleene DEL, MD  pantoprazole  (PROTONIX ) 40 MG tablet TAKE ONE (1) TABLET BY MOUTH EVERY DAY Patient taking differently: Take 40 mg by mouth at bedtime. 08/13/23  Yes McGowen, Aleene DEL, MD  fluorometholone (FML) 0.1 % ophthalmic suspension Place 2 drops into the right eye 3 (three) times daily. 09/09/21   [provider]  tretinoin  (RETIN-A ) 0.025 % cream Apply 1 application topically at bedtime as needed. For acne prevention/control 10/06/20   Candise Aleene DEL, MD    Scheduled Meds:  aspirin EC  81 mg Oral Daily   Continuous Infusions:  heparin 800 Units/hr (11/09/23 2357)   nitroGLYCERIN Stopped (11/10/23 0007)   PRN Meds: acetaminophen  **OR** acetaminophen , eletriptan , ondansetron  **OR** ondansetron  (ZOFRAN ) IV  Allergies:    Allergies  Allergen Reactions   Imitrex  [Sumatriptan ] Other (See Comments)    Face and Arm numbness    Social History:   Social History    Socioeconomic History   Marital status: Married    Spouse name: Not on file   Number of children: Not on file   Years of education: Not on file   Highest education level: Not on file  Occupational History   Not on file  Tobacco Use   Smoking status: Never   Smokeless tobacco: Never  Vaping Use   Vaping status: Never Used  Substance and Sexual Activity   Alcohol use: No   Drug use: No   Sexual activity: Not Currently  Other Topics Concern   Not on file  Social History Narrative   Married, one adult son.  Lives in [place], [place].   Works for [employer]  in [employer].    Exercise: walks 3 miles per day.   No T/A/Ds.               Social Drivers of Corporate Investment Banker Strain: Low Risk  (04/04/2023)   Overall Financial Resource Strain (CARDIA)    Difficulty of Paying Living Expenses: Not hard at all  Food Insecurity: No Food Insecurity (11/10/2023)   Hunger Vital Sign    Worried About Running Out of Food in the Last Year: Never true    Ran Out of Food in the Last Year: Never true  Transportation Needs: No Transportation Needs (11/10/2023)   PRAPARE - Administrator, Civil Service (Medical): No    Lack of Transportation (Non-Medical): No  Physical Activity: Sufficiently Active (04/04/2023)   Exercise Vital Sign    Days of Exercise per Week: 4 days    Minutes of Exercise per Session: 40 min  Recent Concern: Physical Activity - Inactive (04/04/2023)   Exercise Vital Sign    Days of Exercise per Week: 0 days    Minutes of Exercise per Session: 0 min  Stress: No Stress Concern Present (04/04/2023)   [employer] of Occupational Health - Occupational Stress Questionnaire    Feeling of Stress : Not at all  Social Connections: Moderately Integrated (11/10/2023)   Social Connection and Isolation Panel    Frequency of Communication with Friends and Family: More than three times a week    Frequency of Social Gatherings with Friends  and Family: More than three times a week    Attends Religious Services: More than 4 times per year    Active Member of [employer] or Organizations: No    Attends Banker Meetings: Never    Marital Status: Married  Catering Manager Violence: Not At Risk (11/10/2023)   Humiliation, Afraid, Rape, and Kick questionnaire    Fear of Current or Ex-Partner: No    Emotionally Abused: No    Physically Abused: No    Sexually Abused: No    Family History:   Family History  Problem Relation Age of Onset   Cancer Mother        brain ca (glioblastoma)   Breast cancer Paternal Aunt    Breast cancer Maternal Grandmother      ROS:  Please see the history of present illness.  All other ROS reviewed and negative.     Physical Exam/Data: Vitals:   11/10/23 1400 11/10/23 1415 11/10/23 1430 11/10/23 1445  BP: 93/72 105/63 113/86 113/82  Pulse: (!) 58 66 62 68  Resp: (!) 23 20 15  (!) 25  Temp:    98 F (36.7 C)  TempSrc:    Axillary  SpO2: 94% 95% 97% 95%  Weight:      Height:        Intake/Output Summary (Last 24 hours) at 11/10/2023 1706 Last data filed at 11/10/2023 1414 Gross per 24 hour  Intake 200 ml  Output --  Net 200 ml      11/09/2023    7:58 PM 09/19/2023   10:52 AM 08/23/2023    2:40 PM  Last 3 Weights  Weight (lbs) 151 lb 0.2 oz 151 lb 151 lb 12.8 oz  Weight (kg) 68.5 kg 68.493 kg 68.856 kg     Body mass index is 26.75 kg/m.  Exam per MD  EKG:  The EKG was personally reviewed and demonstrates:  NSR 60bpm, low voltage otherwise no acute changes, TWI avL  Relevant  CV Studies:  2d echo 05/2018  1. The left ventricle has normal systolic function with an ejection  fraction of 60-65%. The cavity size was normal. Left ventricular diastolic  Doppler parameters are consistent with impaired relaxation.   2. The right ventricle has normal systolic function. The cavity was  normal. There is no increase in right ventricular wall thickness.   3. No evidence of mitral  valve stenosis.   4. The aortic root and ascending aorta are normal in size and structure.   5. The average left ventricular global longitudinal strain is -17.6 %.   6. The interatrial septum was not assessed.   Laboratory Data: High Sensitivity Troponin:  No results for input(s): TROPONINIHS in the last 720 hours.   Chemistry Recent Labs  Lab 11/09/23 2007 11/10/23 0611  NA 137 140  K 3.8 4.4  CL 102 105  CO2 26 29  GLUCOSE 120* 101*  BUN 8 6*  CREATININE 0.73 0.67  CALCIUM 8.9 8.9  MG  --  1.9  GFRNONAA >60 >60  ANIONGAP 10 7    Recent Labs  Lab 11/09/23 2007 11/10/23 0611  PROT 6.3* 5.8*  ALBUMIN 4.0 3.8  AST 22 36  ALT 10 11  ALKPHOS 72 59  BILITOT 0.2 0.4   Lipids No results for input(s): CHOL, TRIG, HDL, LABVLDL, LDLCALC, CHOLHDL in the last 168 hours.  Hematology Recent Labs  Lab 11/09/23 2007 11/10/23 0611  WBC 6.1 5.7  RBC 4.12 4.01  HGB 12.7 12.3  HCT 37.4 36.0  MCV 90.8 89.8  MCH 30.8 30.7  MCHC 34.0 34.2  RDW 12.0 12.1  PLT 213 205   Thyroid  No results for input(s): TSH, FREET4 in the last 168 hours.  BNPNo results for input(s): BNP, PROBNP in the last 168 hours.  DDimer  Recent Labs  Lab 11/09/23 2007  DDIMER 0.54*    Radiology/Studies:  DG Chest 2 View Result Date: 11/09/2023 EXAM: 2 VIEW(S) XRAY OF THE CHEST 11/09/2023 08:51:00 PM COMPARISON: 10/20/2021 CLINICAL HISTORY: chest pain. Chest pain started around 7:00 pm today 10/24. FINDINGS: LUNGS AND PLEURA: No focal pulmonary opacity. No pulmonary edema. No pleural effusion. No pneumothorax. HEART AND MEDIASTINUM: No acute abnormality of the cardiac and mediastinal silhouettes. BONES AND SOFT TISSUES: No acute osseous abnormality. IMPRESSION: 1. No acute process. Electronically signed by: Greig Pique MD 11/09/2023 08:57 PM EDT RP Workstation: [id number]     Assessment and Plan:  1. Chest pain/suspected NSTEMI - MD evaluated, see her attestation - anticipate  ASA 81mg  daily - add atorvastatin 40mg  daily with lipid panel in AM - hold off beta blocker given HR 50s-60s - continue heparin per pharmacy - order 2D echocardiogram - plan cath on Monday, will place on add on board, MD consented  2. RCC s/p left nephrectomy  - creatinine normal, follow  3. H/o heart murmur - echo pending  Risk Assessment/Risk Scores:    TIMI Risk Score for Unstable Angina or Non-ST Elevation MI:   The patient's TIMI risk score is 1, which indicates a 5% risk of all cause mortality, new or recurrent myocardial infarction or need for urgent revascularization in the next 14 days.   For questions or updates, please contact [facility] Please consult [email or web address] for contact info under   Signed, Dayna N Dunn, PA-C  11/10/2023 5:06 PM  Patient seen and examined, note reviewed with the signed Advanced Practice Provider. I personally reviewed laboratory data, imaging studies and relevant notes. I independently examined the  patient and formulated the important aspects of the plan. I have personally discussed the plan with the patient and/or family. Comments or changes to the note/plan are indicated below.  Patient seen and examined at her bedside. Her husband and brother are both in the room.    A/P:  NSTEMI  Hx of RCC s/p left nephrectomy  Heart murmur   Clinically she is not experiencing any chest pain off the nitroglycerin gtt now. Please continue the heparin gtt. Will start Asipirn 81 mg daily, statin. Will hold on beta blocker for now. She will benefit from an ischemic evaluation plans for [facility] on Monday - sooner if clinical picture changes.  Plans for echo tomorrow.     Dequante Tremaine DO, MS [facility] Attending Cardiologist [facility]  [address] [place], [place] [address] [phone] Website: [email or web address]

## 2023-11-10 NOTE — Progress Notes (Signed)
 Jacqueline Vance is a 63 y.o. female with medical history significant of RCC s/p left nephrectomy, GERD, diastolic CHF, kidney stones who presents to the emergency department due to left-sided chest pain which started 1.5 hours PTA.  She is noted to have troponin elevations that have been increasing with no changes on EKG noted and was diagnosed with NSTEMI and started on a heparin infusion.  Cardiology consulted at [facility] and recommends transfer for further cardiology evaluation.  Patient seen and evaluated at bedside along with husband present.  She has been admitted after midnight and denies any further chest pain or shortness of breath complaints.  She does complain of some mild headache which has now improved since nitroglycerin infusion has been discontinued.  She is currently awaiting transfer to [name] for further evaluation.  Total care time: 30 minutes.

## 2023-11-10 NOTE — Progress Notes (Signed)
 PHARMACY - ANTICOAGULATION CONSULT NOTE  Pharmacy Consult for heparin Indication: chest pain/ACS  Allergies  Allergen Reactions   Imitrex  [Sumatriptan ] Other (See Comments)    Face and Arm numbness    Patient Measurements: Height: 5' 3 (160 cm) Weight: 68.5 kg (151 lb 0.2 oz) IBW/kg (Calculated) : 52.4 HEPARIN DW (KG): 66.4  Vital Signs: Temp: 98 F (36.7 C) (10/25 1445) Temp Source: Axillary (10/25 1445) BP: 113/82 (10/25 1445) Pulse Rate: 68 (10/25 1445)  Labs: Recent Labs    11/09/23 2007 11/10/23 0611 11/10/23 1408  HGB 12.7 12.3  --   HCT 37.4 36.0  --   PLT 213 205  --   HEPARINUNFRC  --  0.47 0.53  CREATININE 0.73 0.67  --     Estimated Creatinine Clearance: 68.5 mL/min (by C-G formula based on SCr of 0.67 mg/dL).   Medical History: Past Medical History:  Diagnosis Date   Adult ADHD    Anxiety    Aortic atherosclerosis 08/2016   Chronic back pain    neck, too.  MRI L spine 05/2008: 40 degrees dextroscoliosis due to a hemivertebra of L3.  Some DDD/spondylosis w/out spinal stenosis.   Colon cancer screening    04/2021 Cologuard negative.   Complication of anesthesia    drop in BP, difficult to wake up, shallow breathing   Depression    Diastolic dysfunction, left ventricle 05/2018   Echo with grd I DD.   Dry eye syndrome    GERD (gastroesophageal reflux disease)    Gout    2 attacks total as of 12/2014.   Heart murmur    Kidney stones 2005; 2017; 2018   Most recent CT 08/2016 showed 3 mm nonobstructing L nephrolithiasis.   Migraine    Proph tx with topamax  helps, abortive tx with relpax  helps.  Couldn't afford topamax  so d/c'd this 04/2016.     Navicular fracture, foot 03/2018   Right Navicular fx-nonunion (surgery)   Pancreatic lesion    10/2021 6mm cystic lesion, stable 03/2022 and 04/2023-->no repeat is needed   Pulmonary nodule 2018   5 mm LLL, stable on 2 yr f/u imaging 2022-->benign, no further imaging needed.   Renal cancer, left (HCC)     partial nephrectomy 06/07/21   Restless legs syndrome    Pt can get to sleep fine so she declines med for this as of 08/2016    Assessment: 63yo female c/o chest pressure, initial troponin negative but now rising >> to begin heparin.  HL 0.52- therapeutic x 2 CBC WNL D-Dimer 0.54  Goal of Therapy:  Heparin level 0.3-0.7 units/ml Monitor platelets by anticoagulation protocol: Yes   Plan:  Continue heparin infusion at 800 units/hr Heparin level daily Continue to monitor H&H and platelets  Elspeth Sour, PharmD Clinical Pharmacist 11/10/2023 3:12 PM

## 2023-11-10 NOTE — H&P (Addendum)
 History and Physical    Patient: Jacqueline Vance FMW:[id number] DOB: October 12, 1960 DOA: 11/09/2023 DOS: the patient was seen and examined on 11/10/2023 PCP: Candise Aleene DEL, MD  Patient coming from: Home  Chief Complaint:  Chief Complaint  Patient presents with   Chest Pain   HPI: Jacqueline Vance is a 63 y.o. female with medical history significant of RCC s/p left nephrectomy, GERD, diastolic CHF, kidney stones who presents to the emergency department due to left-sided chest pain which started 1.5 hours PTA.  Chest was described as pressure-like with radiation to left arm.  She states that she has had similar pain intermittently since last few weeks which generally self resolved, but today was severe and was rated as 10/10 on pain scale.  EMS was activated, and 2 sublingual nitroglycerin and 4 baby aspirin were given en route with improvement in chest pain. At bedside, patient states pain has reduced to 1/10 on pain scale  ED COURSE: In the emergency department, she was hemodynamically stable.  Workup in ED showed normal CBC and BMP except for blood glucose of 120.  Troponin 17 > 60 > 107, D-dimer 0.54. Chest pain showed no acute process Reglan and Compazine was given, morphine  was given due to pain, patient was started on heparin drip. Cardiologist (Dr. Orlando) was consulted and recommended admitting patient to [facility] cardiology team will consult on patient on arrival per EDP.    Review of Systems: As mentioned in the history of present illness. All other systems reviewed and are negative. Past Medical History:  Diagnosis Date   Adult ADHD    Anxiety    Aortic atherosclerosis 08/2016   Chronic back pain    neck, too.  MRI L spine 05/2008: 40 degrees dextroscoliosis due to a hemivertebra of L3.  Some DDD/spondylosis w/out spinal stenosis.   Colon cancer screening    04/2021 Cologuard negative.   Complication of anesthesia    drop in BP, difficult to wake up, shallow breathing    Depression    Diastolic dysfunction, left ventricle 05/2018   Echo with grd I DD.   Dry eye syndrome    GERD (gastroesophageal reflux disease)    Gout    2 attacks total as of 12/2014.   Heart murmur    Kidney stones 2005; 2017; 2018   Most recent CT 08/2016 showed 3 mm nonobstructing L nephrolithiasis.   Migraine    Proph tx with topamax  helps, abortive tx with relpax  helps.  Couldn't afford topamax  so d/c'd this 04/2016.     Navicular fracture, foot 03/2018   Right Navicular fx-nonunion (surgery)   Pancreatic lesion    10/2021 6mm cystic lesion, stable 03/2022 and 04/2023-->no repeat is needed   Pulmonary nodule 2018   5 mm LLL, stable on 2 yr f/u imaging 2022-->benign, no further imaging needed.   Renal cancer, left (HCC)    partial nephrectomy 06/07/21   Restless legs syndrome    Pt can get to sleep fine so she declines med for this as of 08/2016   Past Surgical History:  Procedure Laterality Date   BREAST SURGERY  1999   reduction   ENDOMETRIAL ABLATION  2002   FOOT FRACTURE SURGERY  03/2018   Closed displaced fracture of right navicular, nonunion.   KIDNEY STONE SURGERY  2005   LAPAROSCOPIC VAGINAL HYSTERECTOMY  12/02/2007   for DUB   LUMBAR ESI  2021   no help ([facility])   REDUCTION MAMMAPLASTY     ROBOTIC  ASSITED PARTIAL NEPHRECTOMY Left 06/07/2021   Procedure: XI ROBOTIC ASSITED PARTIAL NEPHRECTOMY;  Surgeon: Devere Lonni Righter, MD;  Location: [facility];  Service: Urology;  Laterality: Left;   TRANSTHORACIC ECHOCARDIOGRAM  06/13/2018   Grd I DD   WISDOM TOOTH EXTRACTION     Social History:  reports that she has never smoked. She has never used smokeless tobacco. She reports that she does not drink alcohol and does not use drugs.  Allergies  Allergen Reactions   Imitrex  [Sumatriptan ] Other (See Comments)    Face and Arm numbness    Family History  Problem Relation Age of Onset   Cancer Mother        brain ca (glioblastoma)   Breast cancer Paternal Aunt     Breast cancer Maternal Grandmother     Prior to Admission medications   Medication Sig Start Date End Date Taking? Authorizing Provider  ALPRAZolam  (XANAX ) 1 MG tablet TAKE ONE TABLET BY MOUTH TWICE A DAY AS NEEDED FOR ANXIETY Patient taking differently: Take 1 mg by mouth 2 (two) times daily as needed for anxiety. TAKE ONE TABLET BY MOUTH TWICE A DAY AS NEEDED FOR ANXIETY 09/10/23  Yes McGowen, Aleene DEL, MD  citalopram  (CELEXA ) 40 MG tablet TAKE ONE (1) TABLET BY MOUTH EVERY DAY Patient taking differently: Take 40 mg by mouth every evening. 09/13/23  Yes McGowen, Aleene DEL, MD  Cyanocobalamin (VITAMIN B-12 PO) Take 1 tablet by mouth daily. Reported on 04/07/2015   Yes [provider]  eletriptan  (RELPAX ) 40 MG tablet TAKE 1 TABLET BY MOUTH  AT  ONSET  OF  HEADACHE.  MAY  REPEAT  IN  2  HOURS  IF  HEADACHE  PERSISITS  OR  RECURS Patient taking differently: Take 40 mg by mouth every 2 (two) hours as needed for headache or migraine. TAKE 1 TABLET BY MOUTH  AT  ONSET  OF  HEADACHE.  MAY  REPEAT  IN  2  HOURS  IF  HEADACHE  PERSISITS  OR  RECURS 10/30/23  Yes McGowen, Aleene DEL, MD  fluorometholone (FML) 0.1 % ophthalmic suspension Place 2 drops into the right eye 3 (three) times daily. 09/09/21  Yes [provider]  HYDROcodone -acetaminophen  (NORCO/VICODIN) 5-325 MG tablet TAKE ONE TABLET BY MOUTH THREE TIMES DAILY AS NEEDED Patient taking differently: Take 1 tablet by mouth 3 (three) times daily as needed for moderate pain (pain score 4-6). 09/10/23  Yes McGowen, Aleene DEL, MD  pantoprazole  (PROTONIX ) 40 MG tablet TAKE ONE (1) TABLET BY MOUTH EVERY DAY Patient taking differently: Take 40 mg by mouth at bedtime. 08/13/23  Yes McGowen, Aleene DEL, MD  tretinoin  (RETIN-A ) 0.025 % cream Apply 1 application topically at bedtime as needed. For acne prevention/control 10/06/20  Yes Candise Aleene DEL, MD    Physical Exam: Vitals:   11/10/23 0145 11/10/23 0200 11/10/23 0345 11/10/23 0354  BP:  103/62  108/71   Pulse: 61 61 (!) 56 (!) 57  Resp: 13 15 15 14   Temp:      TempSrc:      SpO2: 94% 94% 95% 94%  Weight:      Height:        General: Patient was awake and alert and oriented x3. Not in any acute distress.  HEENT: NCAT.  PERRLA. EOMI. Sclerae anicteric.  Moist mucosal membranes. Neck: Neck supple without lymphadenopathy. No carotid bruits. No masses palpated.  Cardiovascular: Regular rate with normal S1-S2 sounds. No murmurs, rubs or gallops auscultated. No JVD.  Respiratory: Clear breath sounds.  No accessory muscle use. Abdomen: Soft, nontender, nondistended. Active bowel sounds. No masses or hepatosplenomegaly  Skin: No rashes, lesions, or ulcerations.  Dry, warm to touch. Musculoskeletal:  2+ dorsalis pedis and radial pulses. Good ROM.  No contractures  Psychiatric: Intact judgment and insight.  Mood appropriate to current condition. Neurologic: No focal neurological deficits. Strength is 5/5 x 4.  CN II - XII grossly intact.  Data Reviewed: Normal sinus rhythm at a rate of 60 bpm  Assessment and Plan: NSTEMI Elevated troponin Atypical Chest Pain Continue telemetry  Troponins x2 - 17 > 60 > 107 EKG personally reviewed showed normal sinus rhythm at rate of 60 bpm Patient was started on heparin drip Cardiology (Dr. Orlando) was consulted and recommended admitting patient to [name] with plan for cardiology team to consult on patient on arrival.  Elevated dimer D-dimer 0.54, based on patient's age, PE is ruled out  GERD Continue Protonix   Chronic diastolic CHF Patient appears euvolemic Continue total input/output, daily weights and fluid restriction Continue heart healthy diet  Echocardiogram done on 06/13/2018 showed EF of 60 to 65%.  LV diastolic parameters are consistent with impaired relaxation.  Advance Care Planning:   Code Status: Full Code   Consults: Cardiology by [facility] EDP  Family Communication: Sister at bedside (all questions answered to  satisfaction)  Severity of Illness: The appropriate patient status for this patient is INPATIENT. Inpatient status is judged to be reasonable and necessary in order to provide the required intensity of service to ensure the patient's safety. The patient's presenting symptoms, physical exam findings, and initial radiographic and laboratory data in the context of their chronic comorbidities is felt to place them at high risk for further clinical deterioration. Furthermore, it is not anticipated that the patient will be medically stable for discharge from the hospital within 2 midnights of admission.   * I certify that at the point of admission it is my clinical judgment that the patient will require inpatient hospital care spanning beyond 2 midnights from the point of admission due to high intensity of service, high risk for further deterioration and high frequency of surveillance required.*  Author: Kaci Dillie, DO 11/10/2023 4:57 AM  For on call review [email or web address].

## 2023-11-10 NOTE — Progress Notes (Signed)
 PHARMACY - ANTICOAGULATION CONSULT NOTE  Pharmacy Consult for heparin Indication: chest pain/ACS  Allergies  Allergen Reactions   Imitrex  [Sumatriptan ] Other (See Comments)    Face and Arm numbness    Patient Measurements: Height: 5' 3 (160 cm) Weight: 68.5 kg (151 lb 0.2 oz) IBW/kg (Calculated) : 52.4 HEPARIN DW (KG): 66.4  Vital Signs: Temp: 98 F (36.7 C) (10/25 0017) Temp Source: Oral (10/25 0017) BP: 111/70 (10/25 0715) Pulse Rate: 60 (10/25 0715)  Labs: Recent Labs    11/09/23 2007 11/10/23 0611  HGB 12.7 12.3  HCT 37.4 36.0  PLT 213 205  HEPARINUNFRC  --  0.47  CREATININE 0.73 0.67    Estimated Creatinine Clearance: 68.5 mL/min (by C-G formula based on SCr of 0.67 mg/dL).   Medical History: Past Medical History:  Diagnosis Date   Adult ADHD    Anxiety    Aortic atherosclerosis 08/2016   Chronic back pain    neck, too.  MRI L spine 05/2008: 40 degrees dextroscoliosis due to a hemivertebra of L3.  Some DDD/spondylosis w/out spinal stenosis.   Colon cancer screening    04/2021 Cologuard negative.   Complication of anesthesia    drop in BP, difficult to wake up, shallow breathing   Depression    Diastolic dysfunction, left ventricle 05/2018   Echo with grd I DD.   Dry eye syndrome    GERD (gastroesophageal reflux disease)    Gout    2 attacks total as of 12/2014.   Heart murmur    Kidney stones 2005; 2017; 2018   Most recent CT 08/2016 showed 3 mm nonobstructing L nephrolithiasis.   Migraine    Proph tx with topamax  helps, abortive tx with relpax  helps.  Couldn't afford topamax  so d/c'd this 04/2016.     Navicular fracture, foot 03/2018   Right Navicular fx-nonunion (surgery)   Pancreatic lesion    10/2021 6mm cystic lesion, stable 03/2022 and 04/2023-->no repeat is needed   Pulmonary nodule 2018   5 mm LLL, stable on 2 yr f/u imaging 2022-->benign, no further imaging needed.   Renal cancer, left (HCC)    partial nephrectomy 06/07/21   Restless legs  syndrome    Pt can get to sleep fine so she declines med for this as of 08/2016    Assessment: 63yo female c/o chest pressure, initial troponin negative but now rising >> to begin heparin.  HL 0.47- therapeutic CBC WNL D-Dimer 0.54  Goal of Therapy:  Heparin level 0.3-0.7 units/ml Monitor platelets by anticoagulation protocol: Yes   Plan:  Continue heparin infusion at 800 units/hr Heparin level in 6 hours and daily Continue to monitor H&H and platelets  Elspeth Sour, PharmD Clinical Pharmacist 11/10/2023 7:49 AM

## 2023-11-10 NOTE — ED Notes (Addendum)
 Pt's family asking for home headache medication. This nurse reached out to [facility] regarding request.

## 2023-11-10 NOTE — Progress Notes (Signed)
 Brief Cardiology Note:  This is a pleasant 63 year old woman with a stated history of PFO, GAD, pulmonary nodules and prior evidence of atherosclerotic disease per imaging, admitted for NSTEMI.  Most recent echo showed EF 60%.  At a time my assessment she is denying chest pain, shortness of breath, palpitations, and feels and appears overall stable.  She is currently on heparin drip per pharmacy and metoprolol was deferred in setting of borderline bradycardia with heart rates 55-60.  Her troponins peaked at 454 and noted then the initial episode at home she has not had any more chest pain.  I agree with the assessment from Dr. Sheena.   Jacqueline LOISE Devonshire, MD, PhD [facility] Cardiology

## 2023-11-10 NOTE — ED Notes (Signed)
 Admitting at bedside

## 2023-11-10 NOTE — Plan of Care (Signed)
  Problem: Education: Goal: Knowledge of General Education information will improve Description: Including pain rating scale, medication(s)/side effects and non-pharmacologic comfort measures Outcome: Progressing   Problem: Nutrition: Goal: Adequate nutrition will be maintained Outcome: Progressing   Problem: Pain Managment: Goal: General experience of comfort will improve and/or be controlled Outcome: Progressing

## 2023-11-10 NOTE — Progress Notes (Signed)
   11/10/23 1126  TOC Brief Assessment  Insurance and Status Reviewed  Patient has primary care physician Yes (MCGOWEN, PHILIP H)  Home environment has been reviewed From home with spouse  Prior level of function: Independent  Prior/Current Home Services No current home services  Social Drivers of Health Review SDOH reviewed no interventions necessary  Readmission risk has been reviewed Yes  Transition of care needs no transition of care needs at this time

## 2023-11-11 ENCOUNTER — Inpatient Hospital Stay (HOSPITAL_COMMUNITY)

## 2023-11-11 DIAGNOSIS — I214 Non-ST elevation (NSTEMI) myocardial infarction: Secondary | ICD-10-CM | POA: Diagnosis not present

## 2023-11-11 LAB — HEPARIN LEVEL (UNFRACTIONATED)
Heparin Unfractionated: <0.1 [IU]/mL — ABNORMAL LOW (ref 0.30–0.70)
Heparin Unfractionated: 0.63 [IU]/mL (ref 0.30–0.70)

## 2023-11-11 LAB — ECHOCARDIOGRAM COMPLETE
AR max vel: 2.22 cm2
AV Peak grad: 8.5 mmHg
Ao pk vel: 1.46 m/s
Area-P 1/2: 3.31 cm2
Height: 63 in
S' Lateral: 2.5 cm
Weight: 2321.6 [oz_av]

## 2023-11-11 LAB — CBC
HCT: 37.3 % (ref 36.0–46.0)
Hemoglobin: 12.8 g/dL (ref 12.0–15.0)
MCH: 30.3 pg (ref 26.0–34.0)
MCHC: 34.3 g/dL (ref 30.0–36.0)
MCV: 88.4 fL (ref 80.0–100.0)
Platelets: 197 K/uL (ref 150–400)
RBC: 4.22 MIL/uL (ref 3.87–5.11)
RDW: 12.2 % (ref 11.5–15.5)
WBC: 5.2 K/uL (ref 4.0–10.5)
nRBC: 0 % (ref 0.0–0.2)

## 2023-11-11 LAB — LIPID PANEL
Cholesterol: 162 mg/dL (ref 0–200)
HDL: 48 mg/dL (ref >40)
LDL Cholesterol: 92 mg/dL (ref 0–99)
Total CHOL/HDL Ratio: 3.4 ratio
Triglycerides: 111 mg/dL (ref <150)
VLDL: 22 mg/dL (ref 0–40)

## 2023-11-11 LAB — BASIC METABOLIC PANEL WITH GFR
Anion gap: 11 (ref 5–15)
BUN: <5 mg/dL — ABNORMAL LOW (ref 8–23)
CO2: 24 mmol/L (ref 22–32)
Calcium: 9 mg/dL (ref 8.9–10.3)
Chloride: 106 mmol/L (ref 98–111)
Creatinine, Ser: 0.74 mg/dL (ref 0.44–1.00)
GFR, Estimated: >60 mL/min (ref >60)
Glucose, Bld: 95 mg/dL (ref 70–99)
Potassium: 3.4 mmol/L — ABNORMAL LOW (ref 3.5–5.1)
Sodium: 141 mmol/L (ref 135–145)

## 2023-11-11 LAB — MAGNESIUM: Magnesium: 1.7 mg/dL (ref 1.7–2.4)

## 2023-11-11 MED ORDER — ALPRAZOLAM 0.5 MG PO TABS
1.0000 mg | ORAL_TABLET | Freq: Two times a day (BID) | ORAL | Status: DC | PRN
  Administered 2023-11-11: 1 mg via ORAL
  Filled 2023-11-11: qty 2

## 2023-11-11 MED ORDER — HYDROCODONE-ACETAMINOPHEN 5-325 MG PO TABS
1.0000 | ORAL_TABLET | Freq: Three times a day (TID) | ORAL | Status: DC | PRN
Start: 2023-11-11 — End: 2023-11-12

## 2023-11-11 MED ORDER — ACETAMINOPHEN 650 MG RE SUPP: 325.0000 mg | Freq: Four times a day (QID) | RECTAL | Status: DC | PRN

## 2023-11-11 MED ORDER — CITALOPRAM HYDROBROMIDE 20 MG PO TABS
40.0000 mg | ORAL_TABLET | Freq: Every day | ORAL | Status: DC
  Administered 2023-11-11: 40 mg via ORAL
  Filled 2023-11-11: qty 2

## 2023-11-11 MED ORDER — ACETAMINOPHEN 325 MG PO TABS: 325.0000 mg | ORAL_TABLET | Freq: Four times a day (QID) | ORAL | Status: DC | PRN

## 2023-11-11 MED ORDER — POTASSIUM CHLORIDE CRYS ER 20 MEQ PO TBCR
40.0000 meq | EXTENDED_RELEASE_TABLET | ORAL | Status: AC
  Administered 2023-11-11 (×2): 40 meq via ORAL
  Filled 2023-11-11 (×3): qty 2

## 2023-11-11 NOTE — Progress Notes (Signed)
 Pt's cardiologist Kardie Tobb,DO updated. Pt had an episode of cp described as soreness & pressure lt upper chest that lasted about 2 mins. she rated it a 1/10. Pt had no sob , diaphoresis or any other sx. Pt's vs are stable & charted. Will continue to monitor the pt. Arkie Tagliaferro R, RN

## 2023-11-11 NOTE — Plan of Care (Addendum)

## 2023-11-11 NOTE — Progress Notes (Signed)
 Progress Note  Patient Name: Jacqueline Vance Date of Encounter: 11/11/2023  Primary Cardiologist: None   Subjective   Patient seen and examined at her bedside. Husband is at bedside. No complaints to chest pain.   Inpatient Medications    Scheduled Meds:  [START ON 11/12/2023] aspirin  81 mg Oral Pre-Cath   aspirin EC  81 mg Oral Daily   atorvastatin  40 mg Oral Daily   [START ON 11/12/2023] free water  500 mL Oral Once   pantoprazole   40 mg Oral Daily   potassium chloride  40 mEq Oral Q4H   Continuous Infusions:  heparin 1,000 Units/hr (11/11/23 0554)   PRN Meds: acetaminophen  **OR** acetaminophen , ALPRAZolam , eletriptan , HYDROcodone -acetaminophen , ondansetron  **OR** ondansetron  (ZOFRAN ) IV   Vital Signs    Vitals:   11/10/23 2329 11/11/23 0604 11/11/23 0836 11/11/23 1200  BP: (!) 140/84 125/82 119/78 114/78  Pulse: 61 64    Resp: 18 18 15 16   Temp: 98.4 F (36.9 C) 97.6 F (36.4 C) 98 F (36.7 C) 97.8 F (36.6 C)  TempSrc: Oral Oral Oral Oral  SpO2: 96% 96%    Weight:  65.8 kg    Height:        Intake/Output Summary (Last 24 hours) at 11/11/2023 1213 Last data filed at 11/11/2023 0307 Gross per 24 hour  Intake 577.61 ml  Output --  Net 577.61 ml   Filed Weights   11/09/23 1958 11/11/23 0604  Weight: 68.5 kg 65.8 kg    Telemetry    Sinus rhythm - Personally Reviewed  ECG     - Personally Reviewed  Physical Exam    General: Comfortable Head: Atraumatic, normal size  Eyes: PEERLA, EOMI  Neck: Supple, normal JVD Cardiac: Normal S1, S2; RRR; no murmurs, rubs, or gallops Lungs: Clear to auscultation bilaterally Abd: Soft, nontender, no hepatomegaly  Ext: warm, no edema Musculoskeletal: No deformities, BUE and BLE strength normal and equal Skin: Warm and dry, no rashes   Neuro: Alert and oriented to person, place, time, and situation, CNII-XII grossly intact, no focal deficits  Psych: Normal mood and affect   Labs    Chemistry Recent  Labs  Lab 11/09/23 2007 11/10/23 0611 11/11/23 0258  NA 137 140 141  K 3.8 4.4 3.4*  CL 102 105 106  CO2 26 29 24   GLUCOSE 120* 101* 95  BUN 8 6* <5*  CREATININE 0.73 0.67 0.74  CALCIUM 8.9 8.9 9.0  PROT 6.3* 5.8*  --   ALBUMIN 4.0 3.8  --   AST 22 36  --   ALT 10 11  --   ALKPHOS 72 59  --   BILITOT 0.2 0.4  --   GFRNONAA >60 >60 >60  ANIONGAP 10 7 11      Hematology Recent Labs  Lab 11/09/23 2007 11/10/23 0611 11/11/23 0258  WBC 6.1 5.7 5.2  RBC 4.12 4.01 4.22  HGB 12.7 12.3 12.8  HCT 37.4 36.0 37.3  MCV 90.8 89.8 88.4  MCH 30.8 30.7 30.3  MCHC 34.0 34.2 34.3  RDW 12.0 12.1 12.2  PLT 213 205 197    Cardiac EnzymesNo results for input(s): TROPONINI in the last 168 hours. No results for input(s): TROPIPOC in the last 168 hours.   BNPNo results for input(s): BNP, PROBNP in the last 168 hours.   DDimer  Recent Labs  Lab 11/09/23 2007  DDIMER 0.54*     Radiology    ECHOCARDIOGRAM COMPLETE Result Date: 11/11/2023    ECHOCARDIOGRAM  REPORT   Patient Name:   [name] [facility] Date of Exam: 11/11/2023 Medical Rec #:  [id number]      Height:       63.0 in Accession #:    [id number]     Weight:       145.1 lb Date of Birth:  11-25-1960      BSA:          1.687 m Patient Age:    63 years       BP:           125/82 mmHg Patient Gender: F              HR:           57 bpm. Exam Location:  Inpatient Procedure: 2D Echo, Cardiac Doppler and Color Doppler (Both Spectral and Color            Flow Doppler were utilized during procedure). Indications:    NSTEMI I21.4  History:        Patient has prior history of Echocardiogram examinations, most                 recent 06/13/2018. CHF, Previous Myocardial Infarction; Migraine.  Sonographer:    Thea Norlander RCS Referring Phys: DAYNA N DUNN IMPRESSIONS  1. Left ventricular ejection fraction, by estimation, is 60 to 65%. The left ventricle has normal function. The left ventricle has no regional wall motion abnormalities. Left  ventricular diastolic parameters were normal.  2. Right ventricular systolic function is normal. The right ventricular size is normal. There is normal pulmonary artery systolic pressure. The estimated right ventricular systolic pressure is 26.0 mmHg.  3. A small pericardial effusion is present. The pericardial effusion is circumferential.  4. The mitral valve is grossly normal. Trivial mitral valve regurgitation. No evidence of mitral stenosis.  5. The aortic valve is grossly normal. Aortic valve regurgitation is not visualized. No aortic stenosis is present.  6. The inferior vena cava is normal in size with greater than 50% respiratory variability, suggesting right atrial pressure of 3 mmHg. FINDINGS  Left Ventricle: Left ventricular ejection fraction, by estimation, is 60 to 65%. The left ventricle has normal function. The left ventricle has no regional wall motion abnormalities. The left ventricular internal cavity size was normal in size. There is  no left ventricular hypertrophy. Left ventricular diastolic parameters were normal. Right Ventricle: The right ventricular size is normal. No increase in right ventricular wall thickness. Right ventricular systolic function is normal. There is normal pulmonary artery systolic pressure. The tricuspid regurgitant velocity is 2.40 m/s, and  with an assumed right atrial pressure of 3 mmHg, the estimated right ventricular systolic pressure is 26.0 mmHg. Left Atrium: Left atrial size was normal in size. Right Atrium: Right atrial size was normal in size. Pericardium: A small pericardial effusion is present. The pericardial effusion is circumferential. Presence of epicardial fat layer. Mitral Valve: The mitral valve is grossly normal. Trivial mitral valve regurgitation. No evidence of mitral valve stenosis. Tricuspid Valve: The tricuspid valve is grossly normal. Tricuspid valve regurgitation is mild . No evidence of tricuspid stenosis. Aortic Valve: The aortic valve is  grossly normal. Aortic valve regurgitation is not visualized. No aortic stenosis is present. Aortic valve peak gradient measures 8.5 mmHg. Pulmonic Valve: The pulmonic valve was grossly normal. Pulmonic valve regurgitation is not visualized. No evidence of pulmonic stenosis. Aorta: The aortic root and ascending aorta are structurally normal, with no evidence of dilitation. Venous: The  inferior vena cava is normal in size with greater than 50% respiratory variability, suggesting right atrial pressure of 3 mmHg. IAS/Shunts: The atrial septum is grossly normal.  LEFT VENTRICLE PLAX 2D LVIDd:         4.30 cm   Diastology LVIDs:         2.50 cm   LV e' medial:    8.81 cm/s LV PW:         0.80 cm   LV E/e' medial:  9.4 LV IVS:        0.80 cm   LV e' lateral:   8.38 cm/s LVOT diam:     2.00 cm   LV E/e' lateral: 9.9 LV SV:         74 LV SV Index:   44 LVOT Area:     3.14 cm  RIGHT VENTRICLE             IVC RV S prime:     12.00 cm/s  IVC diam: 1.90 cm TAPSE (M-mode): 2.0 cm LEFT ATRIUM             Index        RIGHT ATRIUM          Index LA diam:        2.70 cm 1.60 cm/m   RA Area:     9.03 cm LA Vol (A2C):   23.6 ml 14.01 ml/m  RA Volume:   13.60 ml 8.06 ml/m LA Vol (A4C):   36.0 ml 21.34 ml/m LA Biplane Vol: 30.0 ml 17.78 ml/m  AORTIC VALVE AV Area (Vmax): 2.22 cm AV Vmax:        146.00 cm/s AV Peak Grad:   8.5 mmHg LVOT Vmax:      103.00 cm/s LVOT Vmean:     65.500 cm/s LVOT VTI:       0.234 m  AORTA Ao Root diam: 2.90 cm Ao Asc diam:  3.10 cm MITRAL VALVE               TRICUSPID VALVE MV Area (PHT): 3.31 cm    TR Peak grad:   23.0 mmHg MV Decel Time: 229 msec    TR Vmax:        240.00 cm/s MV E velocity: 82.80 cm/s MV A velocity: 85.40 cm/s  SHUNTS MV E/A ratio:  0.97        Systemic VTI:  0.23 m                            Systemic Diam: 2.00 cm Darryle Decent MD Electronically signed by Darryle Decent MD Signature Date/Time: 11/11/2023/11:39:53 AM    Final    DG Chest 2 View Result Date: 11/09/2023 EXAM: 2  VIEW(S) XRAY OF THE CHEST 11/09/2023 08:51:00 PM COMPARISON: 10/20/2021 CLINICAL HISTORY: chest pain. Chest pain started around 7:00 pm today 10/24. FINDINGS: LUNGS AND PLEURA: No focal pulmonary opacity. No pulmonary edema. No pleural effusion. No pneumothorax. HEART AND MEDIASTINUM: No acute abnormality of the cardiac and mediastinal silhouettes. BONES AND SOFT TISSUES: No acute osseous abnormality. IMPRESSION: 1. No acute process. Electronically signed by: Greig Pique MD 11/09/2023 08:57 PM EDT RP Workstation: [id number]    Cardiac Studies   Echo normal   Patient Profile     63 y.o. female with NSTEM  Assessment & Plan    NSTEMI  Hx of RCC s/p left nephrectomy  She is asymptomatic on heparin, Troponin trending down,  peaked at 454.  Continue Asipirn 81 mg daily, statin. Will hold on beta blocker for now. She will benefit from an ischemic evaluation plans for [facility] on Monday - sooner if clinical picture changes.   Echo performed today, thankfully EF normal    For questions or updates, please contact [facility] Please consult [email or web address] for contact info under Cardiology/STEMI.      Signed, Justinian Miano, DO  11/11/2023, 12:13 PM

## 2023-11-11 NOTE — Progress Notes (Signed)
 Echocardiogram 2D Echocardiogram has been performed.  Jacqueline Vance 11/11/2023, 10:33 AM

## 2023-11-11 NOTE — Progress Notes (Signed)
 PHARMACY - ANTICOAGULATION CONSULT NOTE  Pharmacy Consult for heparin Indication: chest pain/ACS  Allergies  Allergen Reactions   Imitrex  [Sumatriptan ] Other (See Comments)    Face and Arm numbness   Patient Measurements: Height: 5' 3 (160 cm) Weight: 65.8 kg (145 lb 1.6 oz) IBW/kg (Calculated) : 52.4 HEPARIN DW (KG): 66.4  Vital Signs: Temp: 97.8 F (36.6 C) (10/26 1200) Temp Source: Oral (10/26 1200) BP: 114/78 (10/26 1200) Pulse Rate: 71 (10/26 1200)  Labs: Recent Labs    11/09/23 2007 11/09/23 2007 11/10/23 0611 11/10/23 1408 11/11/23 0258 11/11/23 1142  HGB 12.7  --  12.3  --  12.8  --   HCT 37.4  --  36.0  --  37.3  --   PLT 213  --  205  --  197  --   HEPARINUNFRC  --    < > 0.47 0.53 <0.10* 0.63  CREATININE 0.73  --  0.67  --  0.74  --    < > = values in this interval not displayed.   Estimated Creatinine Clearance: 67.4 mL/min (by C-G formula based on SCr of 0.74 mg/dL).  Assessment: 63yo female c/o chest pressure, initial troponin negative but now rising >> to begin heparin.  10/26 AM: heparin level undetectable on 800 units/hr after therapeutic x2. Per RN, no issues with the heparin gtt running continuously or s/sx of bleeding. CBC stable  10/26 PM: heparin level therapeutic at 0.63 on infusion of 1000 units/h. No issues with heparin infusion reported.   Goal of Therapy:  Heparin level 0.3-0.7 units/ml Monitor platelets by anticoagulation protocol: Yes   Plan:  Continue heparin infusion at 1000 units/hr Next heparin level, CBC with daily AM labs  Plans for LHC on Monday   Maurilio Patten, PharmD PGY1 Pharmacy Resident [facility] [place] 11/11/2023 12:44 PM

## 2023-11-11 NOTE — H&P (View-Only) (Signed)
 Progress Note  Patient Name: Jacqueline Vance Date of Encounter: 11/11/2023  Primary Cardiologist: None   Subjective   Patient seen and examined at her bedside. Husband is at bedside. No complaints to chest pain.   Inpatient Medications    Scheduled Meds:  [START ON 11/12/2023] aspirin  81 mg Oral Pre-Cath   aspirin EC  81 mg Oral Daily   atorvastatin  40 mg Oral Daily   [START ON 11/12/2023] free water  500 mL Oral Once   pantoprazole   40 mg Oral Daily   potassium chloride  40 mEq Oral Q4H   Continuous Infusions:  heparin 1,000 Units/hr (11/11/23 0554)   PRN Meds: acetaminophen  **OR** acetaminophen , ALPRAZolam , eletriptan , HYDROcodone -acetaminophen , ondansetron  **OR** ondansetron  (ZOFRAN ) IV   Vital Signs    Vitals:   11/10/23 2329 11/11/23 0604 11/11/23 0836 11/11/23 1200  BP: (!) 140/84 125/82 119/78 114/78  Pulse: 61 64    Resp: 18 18 15 16   Temp: 98.4 F (36.9 C) 97.6 F (36.4 C) 98 F (36.7 C) 97.8 F (36.6 C)  TempSrc: Oral Oral Oral Oral  SpO2: 96% 96%    Weight:  65.8 kg    Height:        Intake/Output Summary (Last 24 hours) at 11/11/2023 1213 Last data filed at 11/11/2023 0307 Gross per 24 hour  Intake 577.61 ml  Output --  Net 577.61 ml   Filed Weights   11/09/23 1958 11/11/23 0604  Weight: 68.5 kg 65.8 kg    Telemetry    Sinus rhythm - Personally Reviewed  ECG     - Personally Reviewed  Physical Exam    General: Comfortable Head: Atraumatic, normal size  Eyes: PEERLA, EOMI  Neck: Supple, normal JVD Cardiac: Normal S1, S2; RRR; no murmurs, rubs, or gallops Lungs: Clear to auscultation bilaterally Abd: Soft, nontender, no hepatomegaly  Ext: warm, no edema Musculoskeletal: No deformities, BUE and BLE strength normal and equal Skin: Warm and dry, no rashes   Neuro: Alert and oriented to person, place, time, and situation, CNII-XII grossly intact, no focal deficits  Psych: Normal mood and affect   Labs    Chemistry Recent  Labs  Lab 11/09/23 2007 11/10/23 0611 11/11/23 0258  NA 137 140 141  K 3.8 4.4 3.4*  CL 102 105 106  CO2 26 29 24   GLUCOSE 120* 101* 95  BUN 8 6* <5*  CREATININE 0.73 0.67 0.74  CALCIUM 8.9 8.9 9.0  PROT 6.3* 5.8*  --   ALBUMIN 4.0 3.8  --   AST 22 36  --   ALT 10 11  --   ALKPHOS 72 59  --   BILITOT 0.2 0.4  --   GFRNONAA >60 >60 >60  ANIONGAP 10 7 11      Hematology Recent Labs  Lab 11/09/23 2007 11/10/23 0611 11/11/23 0258  WBC 6.1 5.7 5.2  RBC 4.12 4.01 4.22  HGB 12.7 12.3 12.8  HCT 37.4 36.0 37.3  MCV 90.8 89.8 88.4  MCH 30.8 30.7 30.3  MCHC 34.0 34.2 34.3  RDW 12.0 12.1 12.2  PLT 213 205 197    Cardiac EnzymesNo results for input(s): TROPONINI in the last 168 hours. No results for input(s): TROPIPOC in the last 168 hours.   BNPNo results for input(s): BNP, PROBNP in the last 168 hours.   DDimer  Recent Labs  Lab 11/09/23 2007  DDIMER 0.54*     Radiology    ECHOCARDIOGRAM COMPLETE Result Date: 11/11/2023    ECHOCARDIOGRAM  REPORT   Patient Name:   [name] [facility] Date of Exam: 11/11/2023 Medical Rec #:  [id number]      Height:       63.0 in Accession #:    [id number]     Weight:       145.1 lb Date of Birth:  11-25-1960      BSA:          1.687 m Patient Age:    63 years       BP:           125/82 mmHg Patient Gender: F              HR:           57 bpm. Exam Location:  Inpatient Procedure: 2D Echo, Cardiac Doppler and Color Doppler (Both Spectral and Color            Flow Doppler were utilized during procedure). Indications:    NSTEMI I21.4  History:        Patient has prior history of Echocardiogram examinations, most                 recent 06/13/2018. CHF, Previous Myocardial Infarction; Migraine.  Sonographer:    Thea Norlander RCS Referring Phys: DAYNA N DUNN IMPRESSIONS  1. Left ventricular ejection fraction, by estimation, is 60 to 65%. The left ventricle has normal function. The left ventricle has no regional wall motion abnormalities. Left  ventricular diastolic parameters were normal.  2. Right ventricular systolic function is normal. The right ventricular size is normal. There is normal pulmonary artery systolic pressure. The estimated right ventricular systolic pressure is 26.0 mmHg.  3. A small pericardial effusion is present. The pericardial effusion is circumferential.  4. The mitral valve is grossly normal. Trivial mitral valve regurgitation. No evidence of mitral stenosis.  5. The aortic valve is grossly normal. Aortic valve regurgitation is not visualized. No aortic stenosis is present.  6. The inferior vena cava is normal in size with greater than 50% respiratory variability, suggesting right atrial pressure of 3 mmHg. FINDINGS  Left Ventricle: Left ventricular ejection fraction, by estimation, is 60 to 65%. The left ventricle has normal function. The left ventricle has no regional wall motion abnormalities. The left ventricular internal cavity size was normal in size. There is  no left ventricular hypertrophy. Left ventricular diastolic parameters were normal. Right Ventricle: The right ventricular size is normal. No increase in right ventricular wall thickness. Right ventricular systolic function is normal. There is normal pulmonary artery systolic pressure. The tricuspid regurgitant velocity is 2.40 m/s, and  with an assumed right atrial pressure of 3 mmHg, the estimated right ventricular systolic pressure is 26.0 mmHg. Left Atrium: Left atrial size was normal in size. Right Atrium: Right atrial size was normal in size. Pericardium: A small pericardial effusion is present. The pericardial effusion is circumferential. Presence of epicardial fat layer. Mitral Valve: The mitral valve is grossly normal. Trivial mitral valve regurgitation. No evidence of mitral valve stenosis. Tricuspid Valve: The tricuspid valve is grossly normal. Tricuspid valve regurgitation is mild . No evidence of tricuspid stenosis. Aortic Valve: The aortic valve is  grossly normal. Aortic valve regurgitation is not visualized. No aortic stenosis is present. Aortic valve peak gradient measures 8.5 mmHg. Pulmonic Valve: The pulmonic valve was grossly normal. Pulmonic valve regurgitation is not visualized. No evidence of pulmonic stenosis. Aorta: The aortic root and ascending aorta are structurally normal, with no evidence of dilitation. Venous: The  inferior vena cava is normal in size with greater than 50% respiratory variability, suggesting right atrial pressure of 3 mmHg. IAS/Shunts: The atrial septum is grossly normal.  LEFT VENTRICLE PLAX 2D LVIDd:         4.30 cm   Diastology LVIDs:         2.50 cm   LV e' medial:    8.81 cm/s LV PW:         0.80 cm   LV E/e' medial:  9.4 LV IVS:        0.80 cm   LV e' lateral:   8.38 cm/s LVOT diam:     2.00 cm   LV E/e' lateral: 9.9 LV SV:         74 LV SV Index:   44 LVOT Area:     3.14 cm  RIGHT VENTRICLE             IVC RV S prime:     12.00 cm/s  IVC diam: 1.90 cm TAPSE (M-mode): 2.0 cm LEFT ATRIUM             Index        RIGHT ATRIUM          Index LA diam:        2.70 cm 1.60 cm/m   RA Area:     9.03 cm LA Vol (A2C):   23.6 ml 14.01 ml/m  RA Volume:   13.60 ml 8.06 ml/m LA Vol (A4C):   36.0 ml 21.34 ml/m LA Biplane Vol: 30.0 ml 17.78 ml/m  AORTIC VALVE AV Area (Vmax): 2.22 cm AV Vmax:        146.00 cm/s AV Peak Grad:   8.5 mmHg LVOT Vmax:      103.00 cm/s LVOT Vmean:     65.500 cm/s LVOT VTI:       0.234 m  AORTA Ao Root diam: 2.90 cm Ao Asc diam:  3.10 cm MITRAL VALVE               TRICUSPID VALVE MV Area (PHT): 3.31 cm    TR Peak grad:   23.0 mmHg MV Decel Time: 229 msec    TR Vmax:        240.00 cm/s MV E velocity: 82.80 cm/s MV A velocity: 85.40 cm/s  SHUNTS MV E/A ratio:  0.97        Systemic VTI:  0.23 m                            Systemic Diam: 2.00 cm Darryle Decent MD Electronically signed by Darryle Decent MD Signature Date/Time: 11/11/2023/11:39:53 AM    Final    DG Chest 2 View Result Date: 11/09/2023 EXAM: 2  VIEW(S) XRAY OF THE CHEST 11/09/2023 08:51:00 PM COMPARISON: 10/20/2021 CLINICAL HISTORY: chest pain. Chest pain started around 7:00 pm today 10/24. FINDINGS: LUNGS AND PLEURA: No focal pulmonary opacity. No pulmonary edema. No pleural effusion. No pneumothorax. HEART AND MEDIASTINUM: No acute abnormality of the cardiac and mediastinal silhouettes. BONES AND SOFT TISSUES: No acute osseous abnormality. IMPRESSION: 1. No acute process. Electronically signed by: Greig Pique MD 11/09/2023 08:57 PM EDT RP Workstation: [id number]    Cardiac Studies   Echo normal   Patient Profile     63 y.o. female with NSTEM  Assessment & Plan    NSTEMI  Hx of RCC s/p left nephrectomy  She is asymptomatic on heparin, Troponin trending down,  peaked at 454.  Continue Asipirn 81 mg daily, statin. Will hold on beta blocker for now. She will benefit from an ischemic evaluation plans for [facility] on Monday - sooner if clinical picture changes.   Echo performed today, thankfully EF normal    For questions or updates, please contact [facility] Please consult [email or web address] for contact info under Cardiology/STEMI.      Signed, Justinian Miano, DO  11/11/2023, 12:13 PM

## 2023-11-11 NOTE — Progress Notes (Signed)
 PHARMACY - ANTICOAGULATION CONSULT NOTE  Pharmacy Consult for heparin Indication: chest pain/ACS  Allergies  Allergen Reactions   Imitrex  [Sumatriptan ] Other (See Comments)    Face and Arm numbness    Patient Measurements: Height: 5' 3 (160 cm) Weight: 68.5 kg (151 lb 0.2 oz) IBW/kg (Calculated) : 52.4 HEPARIN DW (KG): 66.4  Vital Signs: Temp: 98.4 F (36.9 C) (10/25 2329) Temp Source: Oral (10/25 2329) BP: 140/84 (10/25 2329) Pulse Rate: 61 (10/25 2329)  Labs: Recent Labs    11/09/23 2007 11/10/23 0611 11/10/23 1408 11/11/23 0258  HGB 12.7 12.3  --  12.8  HCT 37.4 36.0  --  37.3  PLT 213 205  --  197  HEPARINUNFRC  --  0.47 0.53 <0.10*  CREATININE 0.73 0.67  --  0.74    Estimated Creatinine Clearance: 68.5 mL/min (by C-G formula based on SCr of 0.74 mg/dL).  Assessment: 63yo female c/o chest pressure, initial troponin negative but now rising >> to begin heparin.  AM: heparin level undetectable on 800 units/hr after therapeutic x2. Per RN, no issues with the heparin gtt running continuously or s/sx of bleeding. CBC stable  Goal of Therapy:  Heparin level 0.3-0.7 units/ml Monitor platelets by anticoagulation protocol: Yes   Plan:  Increase heparin infusion at 1000 units/hr Heparin level in 6h and daily Continue to monitor H&H and platelets Plans for LHC on Monday   Lynwood Poplar, PharmD, BCPS Clinical Pharmacist 11/11/2023 4:48 AM

## 2023-11-11 NOTE — Progress Notes (Signed)
 PROGRESS NOTE    Jacqueline Vance  FMW:[id number] DOB: 16-May-1960 DOA: 11/09/2023 PCP: Candise Aleene DEL, MD  Outpatient Specialists:     Brief Narrative:  Patient is a 63 year old female past medical history significant for aortic atherosclerosis, diastolic dysfunction, GERD, gout, heart murmur, migraine, renal cancer s/p partial nephrectomy, nephrolithiasis and migraines.  Patient was admitted with chest pain with typical features, however, chest pain was also reproducible.  Patient reported that the chest pain radiated to the neck and left upper extremity.  Rising troponin is noted.  Cardiology input is appreciated.  Patient is on aspirin drip.  Beta-blocker is on hold due to bradycardia.  Allergic team plans to proceed with cardiac catheterization tomorrow, Monday, 11/12/2023.  11/11/2023: Patient seen alongside patient's husband.  No chest pain currently.  Awaiting cardiac catheterization.   Assessment & Plan:   Principal Problem:   NSTEMI (non-ST elevated myocardial infarction) (HCC) Active Problems:   Elevated troponin   GERD (gastroesophageal reflux disease)   Chronic diastolic CHF (congestive heart failure) (HCC)   NSTEMI -Elevated troponin -Atypical and atypical Chest Pain -Patient is currently chest pain-free. -Continue telemetry  -Troponin - 17 > 60 > 107> 454 > 444 -Patient was started on heparin drip -Cardiology input is appreciated.  For cardiac catheterization tomorrow, Monday, 11/12/2023.     Elevated dimer: D-dimer 0.54, based on patient's age, PE is ruled out   GERD: Continue Protonix    Chronic diastolic CHF: -Currently compensated.  Echocardiogram done on 11/11/2023 revealed: 1. Left ventricular ejection fraction, by estimation, is 60 to 65%. The  left ventricle has normal function. The left ventricle has no regional  wall motion abnormalities. Left ventricular diastolic parameters were  normal.   2. Right ventricular systolic function is normal. The  right ventricular  size is normal. There is normal pulmonary artery systolic pressure. The  estimated right ventricular systolic pressure is 26.0 mmHg.   3. A small pericardial effusion is present. The pericardial effusion is  circumferential.   4. The mitral valve is grossly normal. Trivial mitral valve  regurgitation. No evidence of mitral stenosis.   5. The aortic valve is grossly normal. Aortic valve regurgitation is not  visualized. No aortic stenosis is present.   6. The inferior vena cava is normal in size with greater than 50%  respiratory variability, suggesting right atrial pressure of 3 mmHg.    DVT prophylaxis: Heparin drip Code Status: Full code Family Communication: Husband by bedside Disposition Plan: This will depend on results of cardiac catheterization   Consultants:  Cardiology  Procedures:  Awaiting cardiac catheterization tomorrow  Antimicrobials:  None   Subjective: No complaints. No chest pain. No shortness of breath.  Objective: Vitals:   11/10/23 1912 11/10/23 2329 11/11/23 0604 11/11/23 0836  BP:  (!) 140/84 125/82 119/78  Pulse:  61 64   Resp: 18 18 18 15   Temp: 98.5 F (36.9 C) 98.4 F (36.9 C) 97.6 F (36.4 C) 98 F (36.7 C)  TempSrc: Oral Oral Oral Oral  SpO2:  96% 96%   Weight:   65.8 kg   Height:        Intake/Output Summary (Last 24 hours) at 11/11/2023 1037 Last data filed at 11/11/2023 0307 Gross per 24 hour  Intake 577.61 ml  Output --  Net 577.61 ml   Filed Weights   11/09/23 1958 11/11/23 0604  Weight: 68.5 kg 65.8 kg    Examination:  General exam: Appears calm and comfortable  Respiratory system: Clear to auscultation. Respiratory  effort normal. Cardiovascular system: S1 & S2 heard Gastrointestinal system: Abdomen is nondistended, soft and nontender.  Central nervous system: Alert and oriented.  Extremities: No leg edema  Data Reviewed: I have personally reviewed following labs and imaging  studies  CBC: Recent Labs  Lab 11/09/23 2007 11/10/23 0611 11/11/23 0258  WBC 6.1 5.7 5.2  HGB 12.7 12.3 12.8  HCT 37.4 36.0 37.3  MCV 90.8 89.8 88.4  PLT 213 205 197   Basic Metabolic Panel: Recent Labs  Lab 11/09/23 2007 11/10/23 0611 11/11/23 0258  NA 137 140 141  K 3.8 4.4 3.4*  CL 102 105 106  CO2 26 29 24   GLUCOSE 120* 101* 95  BUN 8 6* <5*  CREATININE 0.73 0.67 0.74  CALCIUM 8.9 8.9 9.0  MG  --  1.9 1.7  PHOS  --  3.1  --    GFR: Estimated Creatinine Clearance: 67.4 mL/min (by C-G formula based on SCr of 0.74 mg/dL). Liver Function Tests: Recent Labs  Lab 11/09/23 2007 11/10/23 0611  AST 22 36  ALT 10 11  ALKPHOS 72 59  BILITOT 0.2 0.4  PROT 6.3* 5.8*  ALBUMIN 4.0 3.8   No results for input(s): LIPASE, AMYLASE in the last 168 hours. No results for input(s): AMMONIA in the last 168 hours. Coagulation Profile: No results for input(s): INR, PROTIME in the last 168 hours. Cardiac Enzymes: No results for input(s): CKTOTAL, CKMB, CKMBINDEX, TROPONINI in the last 168 hours. BNP (last 3 results) No results for input(s): PROBNP in the last 8760 hours. HbA1C: No results for input(s): HGBA1C in the last 72 hours. CBG: No results for input(s): GLUCAP in the last 168 hours. Lipid Profile: Recent Labs    11/11/23 0258  CHOL 162  HDL 48  LDLCALC 92  TRIG 111  CHOLHDL 3.4   Thyroid  Function Tests: No results for input(s): TSH, T4TOTAL, FREET4, T3FREE, THYROIDAB in the last 72 hours. Anemia Panel: No results for input(s): VITAMINB12, FOLATE, FERRITIN, TIBC, IRON, RETICCTPCT in the last 72 hours. Urine analysis:    Component Value Date/Time   COLORURINE YELLOW 03/31/2020 1525   APPEARANCEUR CLEAR 03/31/2020 1525   LABSPEC 1.010 03/31/2020 1525   PHURINE 7.5 03/31/2020 1525   GLUCOSEU NEGATIVE 03/31/2020 1525   HGBUR NEGATIVE 03/31/2020 1525   BILIRUBINUR NEGATIVE 03/31/2020 1525   BILIRUBINUR Negative  03/09/2020 1146   KETONESUR NEGATIVE 03/31/2020 1525   PROTEINUR NEGATIVE 03/31/2020 1525   UROBILINOGEN 0.2 03/09/2020 1146   NITRITE NEGATIVE 03/31/2020 1525   LEUKOCYTESUR NEGATIVE 03/31/2020 1525   Sepsis Labs: @LABRCNTIP (procalcitonin:4,lacticidven:4)  )No results found for this or any previous visit (from the past 240 hours).       Radiology Studies: DG Chest 2 View Result Date: 11/09/2023 EXAM: 2 VIEW(S) XRAY OF THE CHEST 11/09/2023 08:51:00 PM COMPARISON: 10/20/2021 CLINICAL HISTORY: chest pain. Chest pain started around 7:00 pm today 10/24. FINDINGS: LUNGS AND PLEURA: No focal pulmonary opacity. No pulmonary edema. No pleural effusion. No pneumothorax. HEART AND MEDIASTINUM: No acute abnormality of the cardiac and mediastinal silhouettes. BONES AND SOFT TISSUES: No acute osseous abnormality. IMPRESSION: 1. No acute process. Electronically signed by: Greig Pique MD 11/09/2023 08:57 PM EDT RP Workstation: [id number]        Scheduled Meds:  [START ON 11/12/2023] aspirin  81 mg Oral Pre-Cath   aspirin EC  81 mg Oral Daily   atorvastatin  40 mg Oral Daily   [START ON 11/12/2023] free water  500 mL Oral Once   pantoprazole   40 mg Oral Daily   Continuous Infusions:  heparin 1,000 Units/hr (11/11/23 0554)     LOS: 1 day    Time spent: 35 minutes.    Leatrice Chapel, MD  [facility] Pager #: [phone] 7PM-7AM contact night coverage as above

## 2023-11-12 ENCOUNTER — Encounter (HOSPITAL_COMMUNITY): Payer: Self-pay | Admitting: Internal Medicine

## 2023-11-12 ENCOUNTER — Encounter (HOSPITAL_COMMUNITY)
Admission: EM | Disposition: A | Discharge status: Home / Self Care | Payer: Self-pay | Source: Home / Self Care | Attending: Internal Medicine

## 2023-11-12 DIAGNOSIS — E785 Hyperlipidemia, unspecified: Secondary | ICD-10-CM

## 2023-11-12 DIAGNOSIS — I959 Hypotension, unspecified: Secondary | ICD-10-CM | POA: Diagnosis not present

## 2023-11-12 DIAGNOSIS — I214 Non-ST elevation (NSTEMI) myocardial infarction: Secondary | ICD-10-CM | POA: Diagnosis not present

## 2023-11-12 HISTORY — PX: LEFT HEART CATH AND CORONARY ANGIOGRAPHY: CATH118249

## 2023-11-12 LAB — CBC
HCT: 38.3 % (ref 36.0–46.0)
Hemoglobin: 13 g/dL (ref 12.0–15.0)
MCH: 30.4 pg (ref 26.0–34.0)
MCHC: 33.9 g/dL (ref 30.0–36.0)
MCV: 89.7 fL (ref 80.0–100.0)
Platelets: 190 K/uL (ref 150–400)
RBC: 4.27 MIL/uL (ref 3.87–5.11)
RDW: 12.3 % (ref 11.5–15.5)
WBC: 5.5 K/uL (ref 4.0–10.5)
nRBC: 0 % (ref 0.0–0.2)

## 2023-11-12 LAB — MAGNESIUM: Magnesium: 1.8 mg/dL (ref 1.7–2.4)

## 2023-11-12 LAB — RENAL FUNCTION PANEL
Albumin: 3.4 g/dL — ABNORMAL LOW (ref 3.5–5.0)
Anion gap: 10 (ref 5–15)
BUN: 6 mg/dL — ABNORMAL LOW (ref 8–23)
CO2: 22 mmol/L (ref 22–32)
Calcium: 9.2 mg/dL (ref 8.9–10.3)
Chloride: 105 mmol/L (ref 98–111)
Creatinine, Ser: 0.75 mg/dL (ref 0.44–1.00)
GFR, Estimated: >60 mL/min (ref >60)
Glucose, Bld: 92 mg/dL (ref 70–99)
Phosphorus: 3.5 mg/dL (ref 2.5–4.6)
Potassium: 4.3 mmol/L (ref 3.5–5.1)
Sodium: 137 mmol/L (ref 135–145)

## 2023-11-12 LAB — HEPARIN LEVEL (UNFRACTIONATED): Heparin Unfractionated: >1.1 [IU]/mL — ABNORMAL HIGH (ref 0.30–0.70)

## 2023-11-12 SURGERY — LEFT HEART CATH AND CORONARY ANGIOGRAPHY
Anesthesia: LOCAL

## 2023-11-12 MED ORDER — VERAPAMIL HCL 2.5 MG/ML IV SOLN
INTRAVENOUS | Status: AC
  Filled 2023-11-12: qty 2

## 2023-11-12 MED ORDER — HEPARIN (PORCINE) IN NACL 1000-0.9 UT/500ML-% IV SOLN
INTRAVENOUS | Status: DC | PRN
  Administered 2023-11-12: 1000 mL

## 2023-11-12 MED ORDER — ATORVASTATIN CALCIUM 80 MG PO TABS: 80.0000 mg | ORAL_TABLET | Freq: Every day | ORAL | 1 refills | Status: DC

## 2023-11-12 MED ORDER — HEPARIN SODIUM (PORCINE) 1000 UNIT/ML IJ SOLN
INTRAMUSCULAR | Status: DC | PRN
  Administered 2023-11-12: 3500 [IU] via INTRAVENOUS

## 2023-11-12 MED ORDER — ASPIRIN 81 MG PO CHEW
CHEWABLE_TABLET | ORAL | Status: AC
Start: 2023-11-12 — End: 2023-11-12
  Filled 2023-11-12: qty 1

## 2023-11-12 MED ORDER — HEPARIN (PORCINE) 25000 UT/250ML-% IV SOLN
800.0000 [IU]/h | INTRAVENOUS | Status: DC
  Administered 2023-11-12: 800 [IU]/h via INTRAVENOUS

## 2023-11-12 MED ORDER — IOHEXOL 350 MG/ML SOLN
INTRAVENOUS | Status: DC | PRN
  Administered 2023-11-12: 40 mL

## 2023-11-12 MED ORDER — ELETRIPTAN HYDROBROMIDE 20 MG PO TABS
20.0000 mg | ORAL_TABLET | ORAL | Status: DC | PRN
  Filled 2023-11-12: qty 1

## 2023-11-12 MED ORDER — NITROGLYCERIN 0.4 MG SL SUBL: 0.4000 mg | SUBLINGUAL_TABLET | SUBLINGUAL | 1 refills | Status: AC | PRN

## 2023-11-12 MED ORDER — VERAPAMIL HCL 2.5 MG/ML IV SOLN
INTRAVENOUS | Status: DC | PRN
  Administered 2023-11-12: 10 mL via INTRA_ARTERIAL

## 2023-11-12 MED ORDER — LIDOCAINE HCL (PF) 1 % IJ SOLN
INTRAMUSCULAR | Status: DC | PRN
  Administered 2023-11-12: 2 mL

## 2023-11-12 MED ORDER — HYDRALAZINE HCL 20 MG/ML IJ SOLN: 10.0000 mg | INTRAMUSCULAR | Status: AC | PRN

## 2023-11-12 MED ORDER — FREE WATER
500.0000 mL | Freq: Once | Status: AC
  Administered 2023-11-12: 500 mL via ORAL

## 2023-11-12 MED ORDER — SODIUM CHLORIDE 0.9% FLUSH: 3.0000 mL | INTRAVENOUS | Status: DC | PRN

## 2023-11-12 MED ORDER — HEPARIN SODIUM (PORCINE) 1000 UNIT/ML IJ SOLN
INTRAMUSCULAR | Status: AC
  Filled 2023-11-12: qty 10

## 2023-11-12 MED ORDER — ENOXAPARIN SODIUM 40 MG/0.4ML IJ SOSY: 40.0000 mg | PREFILLED_SYRINGE | INTRAMUSCULAR | Status: DC

## 2023-11-12 MED ORDER — SODIUM CHLORIDE 0.9 % IV SOLN
250.0000 mL | INTRAVENOUS | Status: DC | PRN
Start: 2023-11-12 — End: 2023-11-12

## 2023-11-12 MED ORDER — LABETALOL HCL 5 MG/ML IV SOLN: 10.0000 mg | INTRAVENOUS | Status: AC | PRN

## 2023-11-12 MED ORDER — SODIUM CHLORIDE 0.9% FLUSH
3.0000 mL | Freq: Two times a day (BID) | INTRAVENOUS | Status: DC
  Administered 2023-11-12: 3 mL via INTRAVENOUS

## 2023-11-12 MED ORDER — ASPIRIN 81 MG PO TBEC: 81.0000 mg | DELAYED_RELEASE_TABLET | Freq: Every day | ORAL | 12 refills | Status: AC

## 2023-11-12 MED ORDER — AMLODIPINE BESYLATE 2.5 MG PO TABS: 1.2500 mg | ORAL_TABLET | Freq: Every day | ORAL | 1 refills | Status: DC

## 2023-11-12 SURGICAL SUPPLY — 6 items
CATH 5FR JL3.5 JR4 ANG PIG MP (CATHETERS) IMPLANT
DEVICE RAD TR BAND REGULAR (VASCULAR PRODUCTS) IMPLANT
GLIDESHEATH SLEND SS 6F .021 (SHEATH) IMPLANT
GUIDEWIRE INQWIRE 1.5J.035X260 (WIRE) IMPLANT
PACK CARDIAC CATHETERIZATION (CUSTOM PROCEDURE TRAY) ×2 IMPLANT
SET ATX-X65L (MISCELLANEOUS) IMPLANT

## 2023-11-12 NOTE — Progress Notes (Signed)
 DISCHARGE NOTE HOME Allysha Tryon Galati to be discharged Home per MD order. Discussed prescriptions and follow up appointments with the patient. Prescriptions given to patient; medication list explained in detail. Patient verbalized understanding.  Skin clean, dry and intact without evidence of skin break down, no evidence of skin tears noted. IV catheter discontinued intact. Site without signs and symptoms of complications. Dressing and pressure applied. Pt denies pain at the site currently. No complaints noted.  Patient free of lines, drains, and wounds.   An After Visit Summary (AVS) was printed and given to the patient. Patient escorted via wheelchair, and discharged home via private auto.  Peyton SHAUNNA Pepper, RN

## 2023-11-12 NOTE — Progress Notes (Signed)
 PHARMACY - ANTICOAGULATION CONSULT NOTE  Pharmacy Consult for heparin Indication: NSTEMI  Labs: Recent Labs    11/10/23 0611 11/10/23 1408 11/11/23 0258 11/11/23 1142 11/12/23 0425  HGB 12.3  --  12.8  --  13.0  HCT 36.0  --  37.3  --  38.3  PLT 205  --  197  --  190  HEPARINUNFRC 0.47   < > <0.10* 0.63 >1.10*  CREATININE 0.67  --  0.74  --  0.75   < > = values in this interval not displayed.   Assessment: 63yo female supratherapeutic on heparin after two levels at goal but noted that had previously been therapeutic at lower rate until one subtherapeutic level and rate was changed; no infusion issues or signs of bleeding per RN.  Goal of Therapy:  Heparin level 0.3-0.7 units/ml   Plan:  Hold heparin x36min then decrease heparin infusion by 3 units/kg/hr to 800 units/hr. Check level in 6 hours.   Marvetta Dauphin, PharmD, BCPS 11/12/2023 6:17 AM

## 2023-11-12 NOTE — Interval H&P Note (Signed)
 History and Physical Interval Note:  11/12/2023 8:59 AM  Jacqueline Vance  has presented today for surgery, with the diagnosis of NSTEMI.  The various methods of treatment have been discussed with the patient and family. After consideration of risks, benefits and other options for treatment, the patient has consented to  Procedure(s): LEFT HEART CATH AND CORONARY ANGIOGRAPHY (N/A) as a surgical intervention.  The patient's history has been reviewed, patient examined, no change in status, stable for surgery.  I have reviewed the patient's chart and labs.  Questions were answered to the patient's satisfaction.    Cath Lab Visit (complete for each Cath Lab visit)  Clinical Evaluation Leading to the Procedure:   ACS: Yes.    Non-ACS:  N/A  Chelbie Jarnagin

## 2023-11-12 NOTE — Progress Notes (Signed)
 Approx 0350 Pt was awakened by lab this morning for morning labs and pt was confused and didn't know where she was or why she was in hospital. Upon me entering room she remembered me and our conversations from last pm. She stated she felt like she was in a dream. Confusion lasted a few minutes. She returned to oriented x4 and complete neuro exam is negative. VS Stable. Pt received Xanax  1mg  at 2130 last PM and Eletripan/Relpax  40mg  at 2220 for c/o headache. Upon talking with pt this morning she states she only takes Relpax  20mg  and repeats it in 2 hours if HA persist.  Provider on Call Dr Alfornia notified via secure chat.

## 2023-11-12 NOTE — Discharge Summary (Incomplete)
 Physician Discharge Summary  Patient ID: Jacqueline Vance MRN: [id number] DOB/AGE: Sep 23, 1960 63 y.o.  Admit date: 11/09/2023 Discharge date: 11/12/2023  Admission Diagnoses:  Discharge Diagnoses:  Principal Problem:   NSTEMI (non-ST elevated myocardial infarction) [facility]) Active Problems:   Elevated troponin   GERD (gastroesophageal reflux disease)   Chronic diastolic CHF (congestive heart failure) (HCC)   Discharged Condition: {condition:18240}  Hospital Course: ***  Consults: {consultation:18241}  Significant Diagnostic Studies: {diagnostics:18242}  Treatments: {Tx:18249}  Discharge Exam: Blood pressure 101/76, pulse 61, temperature 97.6 F (36.4 C), temperature source Oral, resp. rate 18, height 5' 3 (1.6 m), weight 66.7 kg, SpO2 95%. {physical zkjf:6958869}  Disposition: Discharge disposition: 01-Home or Self Care       Discharge Instructions     Diet - low sodium heart healthy   Complete by: As directed    Increase activity slowly   Complete by: As directed       Allergies as of 11/12/2023       Reactions   Imitrex  [sumatriptan ] Other (See Comments)   Face and Arm numbness        Medication List     STOP taking these medications    fluorometholone 0.1 % ophthalmic suspension Commonly known as: FML   tretinoin  0.025 % cream Commonly known as: RETIN-A    VITAMIN B-12 PO       TAKE these medications    ALPRAZolam  1 MG tablet Commonly known as: XANAX  TAKE ONE TABLET BY MOUTH TWICE A DAY AS NEEDED FOR ANXIETY What changed: See the new instructions.   aspirin EC 81 MG tablet Take 1 tablet (81 mg total) by mouth daily. Swallow whole. Start taking on: November 13, 2023   atorvastatin 80 MG tablet Commonly known as: LIPITOR Take 1 tablet (80 mg total) by mouth daily. Start taking on: November 13, 2023   citalopram  40 MG tablet Commonly known as: CELEXA  TAKE ONE (1) TABLET BY MOUTH EVERY DAY What changed:  how much to take when to  take this   eletriptan  40 MG tablet Commonly known as: RELPAX  TAKE 1 TABLET BY MOUTH  AT  ONSET  OF  HEADACHE.  MAY  REPEAT  IN  2  HOURS  IF  HEADACHE  PERSISITS  OR  RECURS What changed:  how much to take how to take this when to take this reasons to take this   HYDROcodone -acetaminophen  5-325 MG tablet Commonly known as: NORCO/VICODIN TAKE ONE TABLET BY MOUTH THREE TIMES DAILY AS NEEDED What changed: reasons to take this   pantoprazole  40 MG tablet Commonly known as: PROTONIX  TAKE ONE (1) TABLET BY MOUTH EVERY DAY What changed:  how much to take when to take this         Signed: [name] 11/12/2023, 12:58 PM

## 2023-11-12 NOTE — Progress Notes (Signed)
 Progress Note  Patient Name: Jacqueline Vance Date of Encounter: 11/12/2023 [facility] Cardiologist: None   Interval Summary   Patient is a 63 year old female with a history of RCC s/p left nephrectomy, GERD, diastolic CHF, and nephrolithiasis, admitted for chest pain and elevated troponin concerning for NSTEMI. She was started on aspirin, high-intensity statin, and heparin infusion. Echocardiogram demonstrated preserved LV function (EF 60-65%) without wall motion abnormalities.  Today, the patient underwent left and right heart catheterization, which revealed non-obstructive coronary arteries (OST LAD 20%) with no hemodynamically significant stenosis. She is currently asymptomatic, denies chest pain, dyspnea, or palpitations, and is hemodynamically stable. No overnight events or new complaints.  Vital Signs Vitals:   11/12/23 0920 11/12/23 0925 11/12/23 0930 11/12/23 1006  BP: 114/70 103/75 104/73 101/72  Pulse: 63 62 60 (!) 57  Resp: 20 18 (!) 21 20  Temp:    98.3 F (36.8 C)  TempSrc:    Oral  SpO2: 99% 98% 100% 95%  Weight:      Height:        Intake/Output Summary (Last 24 hours) at 11/12/2023 1026 Last data filed at 11/12/2023 0412 Gross per 24 hour  Intake 500 ml  Output --  Net 500 ml      11/12/2023    3:51 AM 11/11/2023    6:04 AM 11/09/2023    7:58 PM  Last 3 Weights  Weight (lbs) 147 lb 145 lb 1.6 oz 151 lb 0.2 oz  Weight (kg) 66.679 kg 65.817 kg 68.5 kg      Telemetry/ECG  Normal sinus rhythm- Personally Reviewed  Physical Exam  GEN: No acute distress.   Neck: No JVD Cardiac: RRR, no murmurs, rubs, or gallops.  Respiratory: Clear to auscultation bilaterally. GI: Soft, nontender, non-distended  MS: No edema  Assessment & Plan   #Elevated troponin # NSTEMI (Non-ST Elevation Myocardial Infarction) Presented with chest pain, elevated troponin, and normal ECG. LHC revealed non-obstructive coronary disease ? consider MINOCA (Myocardial  Infarction with Non-Obstructive Coronary Arteries) or Type II MI secondary to supply-demand mismatch. Now asymptomatic, hemodynamically stable  Plan: -Continue Aspirin 81 mg daily -Increase Atorvastatin to 80 mg nightly -Continue Telemetry monitoring -Evaluate for secondary causes of MINOCA (coronary vasospasm, microvascular dysfunction, myocarditis) as outpatient - Cardiology follow-up at discharge  # Hypertension / Diastolic Heart Failure (HFpEF) EF preserved; currently euvolemic.   # History of Renal Cell Carcinoma (s/p Left Nephrectomy) / Nephrolithiasis Stable renal function (Cr 0.74). Plan: Continue to monitor renal parameters Avoid nephrotoxins  # GERD Stable on current therapy. Plan: Continue Pantoprazole  40 mg daily  Disposition Continue inpatient monitoring overnight  Anticipate discharge in the morning if remains stable and asymptomatic   For questions or updates, please contact [facility] Please consult [email or web address] for contact info under         Signed, Maryam Azadegan, MD   I have seen and examined the patient along with Armando Rossetti, MD.  I have reviewed the chart, notes and new data.  I agree with their note.  Key new complaints: She has had similar episodes of chest discomfort at least 5 or 6 times during the last 12 months.  The current episode was more severe and was the only time she had associated radiation of pain to her left arm and neck, but all of the previous events had a similar pattern of retrosternal pressure with a gripping sensation.  She had relief from nitrates, but unfortunately this triggered a migraine headaches. Key examination  changes: Normal cardiovascular examination.  She tends to run a low blood pressure around 100/60. Key new findings / data: No significant abnormalities on ECG.  Marked abrupt increase in high-sensitivity troponin to a peak of 400 consistent with a tiny non-STEMI.  No wall motion abnormalities and  overall normal LVEF on echocardiogram.  Minimal evidence of coronary atherosclerosis on angiography today.  Normal filling pressures.  Mildly elevated LDL cholesterol typically in the 115-120 range, slightly lower during this admission.  PLAN: The most likely diagnosis appears to be coronary vasospasm. Symptoms are compatible with angina.  They do occur during periods of emotional stress related to issues with her brother and son, but the symptoms are repetitive over many months which makes stress cardiomyopathy unlikely. Treatment will be challenging due to her low blood pressure and history of migraine headaches.  Will try a very low-dose of amlodipine and sublingual nitroglycerin as needed. Little reason to suspect myocarditis. Even though she does not have severe epicardial CAD, improving her LDL cholesterol level may have some benefit for coronary vasospasm.  Will start statin therapy with target LDL less than 70. She can be discharged today after bedrest for the arterial puncture.  [facility] will sign off.   Medication Recommendations:   Amlodipine 1.25 mg daily (half of a 2.5 mg tablet daily) Nitroglycerin 0.4 mg sublingual as needed for prolonged episodes of chest discomfort; 1 tablet every 5 minutes to maximum of 3 Aspirin 81 mg daily Atorvastatin 40 mg daily Other recommendations (labs, testing, etc): Recheck a lipid profile in 3 months. Follow up as an outpatient: Will make arrangements for follow-up visit in 2-3 weeks.   Kinley Ferrentino, MD, FACC [facility] [phone] 11/12/2023, 12:26 PM

## 2023-11-12 NOTE — Discharge Summary (Signed)
 Physician Discharge Summary  Patient ID: Jacqueline Vance MRN: [id number] DOB/AGE: [birth year] 63 y.o.  Admit date: 11/09/2023 Discharge date: 11/12/2023  Admission Diagnoses:  Discharge Diagnoses:  Principal Problem:   NSTEMI (non-ST elevated myocardial infarction) [facility]) Active Problems:   Elevated troponin   GERD (gastroesophageal reflux disease)   Chronic diastolic CHF (congestive heart failure) (HCC)   Discharged Condition: {condition:18240}  Hospital Course: ***  Consults: {consultation:18241}  Significant Diagnostic Studies: {diagnostics:18242}  Treatments: {Tx:18249}  Discharge Exam: Blood pressure 101/76, pulse 61, temperature 97.6 F (36.4 C), temperature source Oral, resp. rate 18, height 5' 3 (1.6 m), weight 66.7 kg, SpO2 95%. {physical zkjf:6958869}  Disposition: Discharge disposition: 01-Home or Self Care       Discharge Instructions     Diet - low sodium heart healthy   Complete by: As directed    Increase activity slowly   Complete by: As directed       Allergies as of 11/12/2023       Reactions   Imitrex  [sumatriptan ] Other (See Comments)   Face and Arm numbness        Medication List     STOP taking these medications    fluorometholone 0.1 % ophthalmic suspension Commonly known as: FML   tretinoin  0.025 % cream Commonly known as: RETIN-A    VITAMIN B-12 PO       TAKE these medications    ALPRAZolam  1 MG tablet Commonly known as: XANAX  TAKE ONE TABLET BY MOUTH TWICE A DAY AS NEEDED FOR ANXIETY What changed: See the new instructions.   amLODipine 2.5 MG tablet Commonly known as: NORVASC Take 0.5 tablets (1.25 mg total) by mouth daily.   aspirin EC 81 MG tablet Take 1 tablet (81 mg total) by mouth daily. Swallow whole. Start taking on: November 13, 2023   atorvastatin 80 MG tablet Commonly known as: LIPITOR Take 1 tablet (80 mg total) by mouth daily. Start taking on: November 13, 2023   citalopram  40 MG  tablet Commonly known as: CELEXA  TAKE ONE (1) TABLET BY MOUTH EVERY DAY What changed:  how much to take when to take this   eletriptan  40 MG tablet Commonly known as: RELPAX  TAKE 1 TABLET BY MOUTH  AT  ONSET  OF  HEADACHE.  MAY  REPEAT  IN  2  HOURS  IF  HEADACHE  PERSISITS  OR  RECURS What changed:  how much to take how to take this when to take this reasons to take this   HYDROcodone -acetaminophen  5-325 MG tablet Commonly known as: NORCO/VICODIN TAKE ONE TABLET BY MOUTH THREE TIMES DAILY AS NEEDED What changed: reasons to take this   nitroGLYCERIN 0.4 MG SL tablet Commonly known as: Nitrostat Place 1 tablet (0.4 mg total) under the tongue every 5 (five) minutes as needed for chest pain.   pantoprazole  40 MG tablet Commonly known as: PROTONIX  TAKE ONE (1) TABLET BY MOUTH EVERY DAY What changed:  how much to take when to take this         Signed: [name] 11/12/2023, 1:22 PM

## 2023-11-12 NOTE — Progress Notes (Signed)
 Chaplain responded to a consult request for Advance Directive education. Pt was accompanied by her spouse and another visitor. Chaplain provided Advance Directive education as outlined below as well as information about how to complete after discharge as pt anticipates discharge at any time.  Chaplain provided the Advance Directive packet as well as education on Advance Directives-documents an individual completes to communicate their health care directions in advance of a time when they may need them. Chaplain informed pt the documents which may be completed here in the hospital are the Living Will and Health Care Power of [place].   Chaplain informed that the Health Care Power of [place] is a legal document in which an individual names another person, their Health Care Agent, to make health care decisions when the individual is not able to make them for themselves. The Health Care Agent's function can be temporary or permanent depending on the pt's ability to make and communicate those decisions independently. Chaplain informed pt in the absence of a Health Care Power of Attorney, the state of [place]  directs health care providers to look to the following individuals in the order listed: legal guardian; an attorney?in?fact under a general power of attorney (POA) if that POA includes the right to make health care decisions; a husband or wife; a majority of parents and adult children; a majority of adult brothers and sisters; or an individual who has an established relationship with you, who is acting in good faith and who can convey your wishes.  If none of these person are available or willing to make medical decisions on a patient's behalf, the law allows the patient's doctor to make decisions for them as long as another doctor agrees with those decisions.  Chaplain also informed the patient that the Health Care agent has no decision-making authority over any affairs other than those related to his  or her medical care.   The chaplain further educated the pt that a Living Will is a legal document that allows an individual to state his or her desire not to receive life-prolonging measures in the event that they have a condition that is incurable and will result in their death in a short period of time; they are unconscious, and doctors are confident that they will not regain consciousness; and/or they have advanced dementia or other substantial and irreversible loss of mental function. The chaplain informed pt that life-prolonging measures are medical treatments that would only serve to postpone death, including breathing machines, kidney dialysis, antibiotics, artificial nutrition and hydration (tube feeding), and similar forms of treatment and that if an individual is able to express their wishes, they may also make them known without the use of a Living Will, but in the event that an individual is not able to express their wishes themselves, a Living Will allows medical providers and the pt's family and friends ensure that they are not making decisions on the pt's behalf, but rather serving as the pt's voice to convey decisions the pt has already made.   The patient is aware that the decision to create an advance directive is theirs alone and they may chose not to complete the documents or may chose to complete one portion or both.  The patient was informed that they can revoke the documents at any time by striking through them and writing void or by completing new documents, but that it is also advisable that the individual verbally notify interested parties that their wishes have changed.  They are also  aware that the document must be signed in the presence of a notary public and two witnesses and that this can be done while the patient is still admitted to the hospital or after discharge in the community. If they decide to complete Advance Directives after being discharged from the hospital, they  have been advised to notify all interested parties and to provide those documents to their physicians and loved ones in addition to bringing them to the hospital in the event of another hospitalization.   The chaplain informed the pt that if they desire to proceed with completing Advance Directive Documentation while they are still admitted, notary services are typically available at [facility] between the hours of 1:00 and 3:30 Monday-Thursday.    When the patient is ready to have these documents completed, the patient should request that their nurse place a spiritual care consult and indicate that the patient is ready to have their advance directives notarized so that arrangements for witnesses and notary public can be made.  Please page spiritual care if the patient desires further education or has questions.      Alan HERO. Davee Lomax, M.Div. [facility] Chaplain Pager [phone] Office [phone]    11/12/23 1602  Spiritual Encounters  Type of Visit Initial  Care provided to: Pt and family  Reason for visit Advance directives  Advance Directives (For Healthcare)  Does Patient Have a Medical Advance Directive? No  Would patient like information on creating a medical advance directive? Yes (Inpatient - patient defers creating a medical advance directive at this time - Information given)

## 2023-11-13 ENCOUNTER — Telehealth: Payer: Self-pay

## 2023-11-13 ENCOUNTER — Encounter: Payer: Self-pay | Admitting: Family Medicine

## 2023-11-13 NOTE — Telephone Encounter (Signed)
 Noted: nurse phone contact with patient for TCM. Signed:  Gerlene Hockey, MD           11/13/2023

## 2023-11-13 NOTE — Transitions of Care (Post Inpatient/ED Visit) (Signed)
 11/13/2023  Name: Jacqueline Vance MRN: [id number] DOB: [birth year]  Today's TOC FU Call Status: Today's TOC FU Call Status:: Successful TOC FU Call Completed TOC FU Call Complete Date: 11/13/23 Patient's Name and Date of Birth confirmed.  Transition Care Management Follow-up Telephone Call Date of Discharge: 11/12/23 Discharge Facility: [name]) Type of Discharge: Inpatient Admission Primary Inpatient Discharge Diagnosis:: NSTEMI How have you been since you were released from the hospital?: Better Any questions or concerns?: No  Items Reviewed: Did you receive and understand the discharge instructions provided?: Yes Medications obtained,verified, and reconciled?: Yes (Medications Reviewed) Any new allergies since your discharge?: No Dietary orders reviewed?: Yes Do you have support at home?: Yes People in Home [RPT]: child(ren), adult, spouse, sibling(s)  Medications Reviewed Today: Medications Reviewed Today     Reviewed by Emmitt Pan, LPN (Licensed Practical Nurse) on 11/13/23 at 1044  Med List Status: <None>   Medication Order Taking? Sig Documenting Provider Last Dose Status Informant  ALPRAZolam  (XANAX ) 1 MG tablet [id number] Yes TAKE ONE TABLET BY MOUTH TWICE A DAY AS NEEDED FOR ANXIETY  Patient taking differently: Take 1 mg by mouth 2 (two) times daily as needed for anxiety. TAKE ONE TABLET BY MOUTH TWICE A DAY AS NEEDED FOR ANXIETY   McGowen, Aleene DEL, MD  Active Self, Pharmacy Records  amLODipine (NORVASC) 2.5 MG tablet [id number] Yes Take 0.5 tablets (1.25 mg total) by mouth daily. Rosario Eland I, MD  Active   aspirin EC 81 MG tablet [id number] Yes Take 1 tablet (81 mg total) by mouth daily. Swallow whole. Rosario Eland I, MD  Active   atorvastatin (LIPITOR) 80 MG tablet [id number] Yes Take 1 tablet (80 mg total) by mouth daily. Rosario Eland I, MD  Active   citalopram  (CELEXA ) 40 MG tablet [id number] Yes TAKE ONE (1) TABLET BY MOUTH EVERY DAY   Patient taking differently: Take 40 mg by mouth every evening.   McGowen, Philip H, MD  Active Self, Pharmacy Records  eletriptan  (RELPAX ) 40 MG tablet [id number] Yes TAKE 1 TABLET BY MOUTH  AT  ONSET  OF  HEADACHE.  MAY  REPEAT  IN  2  HOURS  IF  HEADACHE  PERSISITS  OR  RECURS  Patient taking differently: Take 40 mg by mouth every 2 (two) hours as needed for headache or migraine. TAKE 1 TABLET BY MOUTH  AT  ONSET  OF  HEADACHE.  MAY  REPEAT  IN  2  HOURS  IF  HEADACHE  PERSISITS  OR  RECURS   McGowen, Aleene DEL, MD  Active Self, Pharmacy Records  HYDROcodone -acetaminophen  (NORCO/VICODIN) 5-325 MG tablet [id number] Yes TAKE ONE TABLET BY MOUTH THREE TIMES DAILY AS NEEDED  Patient taking differently: Take 1 tablet by mouth 3 (three) times daily as needed for moderate pain (pain score 4-6).   Candise Aleene DEL, MD  Active Self, Pharmacy Records  nitroGLYCERIN (NITROSTAT) 0.4 MG SL tablet [id number] Yes Place 1 tablet (0.4 mg total) under the tongue every 5 (five) minutes as needed for chest pain. Rosario Eland I, MD  Active   pantoprazole  (PROTONIX ) 40 MG tablet [id number] Yes TAKE ONE (1) TABLET BY MOUTH EVERY DAY  Patient taking differently: Take 40 mg by mouth at bedtime.   McGowen, Aleene DEL, MD  Active Self, Pharmacy Records            Home Care and Equipment/Supplies: Were Home Health Services Ordered?: NA Any new equipment or medical supplies ordered?: NA  Functional Questionnaire:  Do you need assistance with bathing/showering or dressing?: No Do you need assistance with meal preparation?: No Do you need assistance with eating?: No Do you have difficulty maintaining continence: No Do you need assistance with getting out of bed/getting out of a chair/moving?: No Do you have difficulty managing or taking your medications?: No  Follow up appointments reviewed: PCP Follow-up appointment confirmed?: Yes Date of PCP follow-up appointment?: 11/22/23 Follow-up Provider:  [facility] Follow-up appointment confirmed?: Yes Follow-Up Specialty Provider:: cardio Do you need transportation to your follow-up appointment?: No Do you understand care options if your condition(s) worsen?: Yes-patient verbalized understanding    SIGNATURE Julian Lemmings, LPN [facility] Nurse Health Advisor Direct Dial [phone]

## 2023-11-14 ENCOUNTER — Other Ambulatory Visit: Payer: Self-pay | Admitting: Family Medicine

## 2023-11-15 NOTE — Telephone Encounter (Signed)
 Refill requested for Hydrocodone  sent to [facility] pharmacy Last ov 9/3 Next ov 11/10

## 2023-11-22 ENCOUNTER — Inpatient Hospital Stay: Admitting: Family Medicine

## 2023-11-26 ENCOUNTER — Ambulatory Visit: Admitting: Family Medicine

## 2023-11-26 ENCOUNTER — Encounter: Payer: Self-pay | Admitting: Family Medicine

## 2023-11-26 VITALS — BP 90/64 | HR 58 | Ht 63.0 in | Wt 149.0 lb

## 2023-11-26 DIAGNOSIS — F411 Generalized anxiety disorder: Secondary | ICD-10-CM | POA: Diagnosis not present

## 2023-11-26 DIAGNOSIS — Z79899 Other long term (current) drug therapy: Secondary | ICD-10-CM | POA: Diagnosis not present

## 2023-11-26 DIAGNOSIS — Z23 Encounter for immunization: Secondary | ICD-10-CM

## 2023-11-26 DIAGNOSIS — I259 Chronic ischemic heart disease, unspecified: Secondary | ICD-10-CM

## 2023-11-26 DIAGNOSIS — G894 Chronic pain syndrome: Secondary | ICD-10-CM | POA: Diagnosis not present

## 2023-11-26 DIAGNOSIS — M47816 Spondylosis without myelopathy or radiculopathy, lumbar region: Secondary | ICD-10-CM

## 2023-11-26 DIAGNOSIS — F4323 Adjustment disorder with mixed anxiety and depressed mood: Secondary | ICD-10-CM

## 2023-11-26 DIAGNOSIS — M47819 Spondylosis without myelopathy or radiculopathy, site unspecified: Secondary | ICD-10-CM | POA: Diagnosis not present

## 2023-11-26 DIAGNOSIS — I201 Angina pectoris with documented spasm: Secondary | ICD-10-CM | POA: Diagnosis not present

## 2023-11-26 DIAGNOSIS — M419 Scoliosis, unspecified: Secondary | ICD-10-CM | POA: Diagnosis not present

## 2023-11-26 MED ORDER — ELETRIPTAN HYDROBROMIDE 40 MG PO TABS: ORAL_TABLET | ORAL | 5 refills | Status: DC

## 2023-11-26 NOTE — Progress Notes (Signed)
 11/26/2023  CC: No chief complaint on file.   Patient is a 63 y.o. female who presents accompanied by her husband for  hospital follow up, specifically [facility] face-to-face visit. Dates hospitalized: 10/24 to 11/12/2023. Days since d/c from hospital: 14 Patient was discharged from hospital to home. Reason for admission to hospital: Chest pain. Date of interactive (phone) contact with patient and/or caregiver: 11/13/2023.  I have reviewed patient's discharge summary plus pertinent specific notes, labs, and imaging from the hospitalization.  She presented with chest pain radiating up the left shoulder and arm.  Troponins elevated upon presentation, trended upward from there. Cath showed nonobstructive coronary artery disease, no culprit lesion. Echo normal except small pericardial effusion. Explanation for chest pain: Coronary vasospasm versus myopericarditis versus noncardiac pathology.  Currently: She still has a few episodes of left-sided chest pain that goes up into her shoulder and neck.  The episodes are consistently brought on in times of increased stress and anxiety. She has had to take a dose of nitroglycerin on a few occasions over the last couple of weeks since being out of the hospital. She gets a headache when she takes nitroglycerin, had to take her Toy triptan today. She has no associated shortness of breath, dizziness, nausea, or palpitations with her chest pains. She has not checked her blood pressure since going home from the hospital.  Discharge medications: Alprazolam  1 mg twice daily as needed, amlodipine one half of a 2.5 mg tab daily, aspirin 81 mg daily, atorvastatin 80 mg daily, citalopram  40 mg daily, Ella triptan 40 mg as needed migraine, Vicodin 5/325 1 tab 3 times daily as needed, sublingual nitroglycerin as needed, pantoprazole  40 mg daily.  Medication reconciliation was done today and patient is taking meds as recommended by discharging  hospitalist/specialist.    Indication for chronic opioid: neck, mid back, low back, HA's, myofascial pain, arthralgias of appendicular skeleton.  Some days finds it hard to get out of bed and function.   She carries dx of DDD of C and L spine, significant scoliosis, myofascial pain syndrome with trigger points and recurrent trochanteric bursitis.  Has seen a handful of specialists in the past ([facility] [place] [facility] [place] [facility], neurolog[place], in [place], also [facility] in [place]  She has never been managed by a pain mgmt specialist.  PMP AWARE reviewed today: most recent rx for Vicodin 5-325 was filled 11/15/2023, # 90, rx by me.  Most recent alprazolam  prescription was filled 09/10/2023, #180, prescription by me. No red flags.  PMH:  Past Medical History:  Diagnosis Date   Adult ADHD    Anxiety    Aortic atherosclerosis 08/2016   Atypical chest pain    Elevated troponins, no culprit lesion on cath, small pericardial effusion on echo-->? myocarditis.  ? coronary artery spasm?   Chronic back pain    neck, too.  MRI L spine 05/2008: 40 degrees dextroscoliosis due to a hemivertebra of L3.  Some DDD/spondylosis w/out spinal stenosis.   Colon cancer screening    04/2021 Cologuard negative.   Complication of anesthesia    drop in BP, difficult to wake up, shallow breathing   Depression    Diastolic dysfunction, left ventricle 05/2018   Echo with grd I DD.   Dry eye syndrome    GERD (gastroesophageal reflux disease)    Gout    2 attacks total as of 12/2014.   Heart murmur    Kidney stones 2005; 2017; 2018  Most recent CT 08/2016 showed 3 mm nonobstructing L nephrolithiasis.   Migraine    Proph tx with topamax  helps, abortive tx with relpax  helps.  Couldn't afford topamax  so d/c'd this 04/2016.     Navicular fracture, foot 03/2018   Right Navicular fx-nonunion (surgery)   Pancreatic lesion    10/2021 6mm cystic lesion, stable 03/2022 and 04/2023-->no repeat is needed    Pulmonary nodule 2018   5 mm LLL, stable on 2 yr f/u imaging 2022-->benign, no further imaging needed.   Renal cancer, left (HCC)    partial nephrectomy 06/07/21   Restless legs syndrome    Pt can get to sleep fine so she declines med for this as of 08/2016    PSH:  Past Surgical History:  Procedure Laterality Date   BREAST SURGERY  1999   reduction   ENDOMETRIAL ABLATION  2002   FOOT FRACTURE SURGERY  03/2018   Closed displaced fracture of right navicular, nonunion.   KIDNEY STONE SURGERY  2005   LAPAROSCOPIC VAGINAL HYSTERECTOMY  12/02/2007   for DUB   LEFT HEART CATH AND CORONARY ANGIOGRAPHY N/A 11/12/2023   20% ostial LAD stenosis, otherwise normal.  Procedure: LEFT HEART CATH AND CORONARY ANGIOGRAPHY;  Surgeon: Mady Bruckner, MD;  Location: [facility];  Service: Cardiovascular;  Laterality: N/A;   LUMBAR ESI  2021   no help ([place])   REDUCTION MAMMAPLASTY     ROBOTIC ASSITED PARTIAL NEPHRECTOMY Left 06/07/2021   Procedure: XI ROBOTIC ASSITED PARTIAL NEPHRECTOMY;  Surgeon: Devere Bruckner Righter, MD;  Location: [facility];  Service: Urology;  Laterality: Left;   TRANSTHORACIC ECHOCARDIOGRAM  06/13/2018   2020 Grd I DD.  10/2023 normal except small pericardial effusion   WISDOM TOOTH EXTRACTION      MEDS:  Outpatient Medications Prior to Visit  Medication Sig Dispense Refill   ALPRAZolam  (XANAX ) 1 MG tablet TAKE ONE TABLET BY MOUTH TWICE A DAY AS NEEDED FOR ANXIETY (Patient taking differently: Take 1 mg by mouth 2 (two) times daily as needed for anxiety. TAKE ONE TABLET BY MOUTH TWICE A DAY AS NEEDED FOR ANXIETY) 180 tablet 1   amLODipine (NORVASC) 2.5 MG tablet Take 0.5 tablets (1.25 mg total) by mouth daily. 30 tablet 1   aspirin EC 81 MG tablet Take 1 tablet (81 mg total) by mouth daily. Swallow whole. 30 tablet 12   atorvastatin (LIPITOR) 80 MG tablet Take 1 tablet (80 mg total) by mouth daily. 30 tablet 1   citalopram  (CELEXA ) 40 MG tablet TAKE ONE (1) TABLET BY  MOUTH EVERY DAY (Patient taking differently: Take 40 mg by mouth every evening.) 90 tablet 0   HYDROcodone -acetaminophen  (NORCO/VICODIN) 5-325 MG tablet TAKE ONE TABLET BY MOUTH THREE TIMES DAILY AS NEEDED 90 tablet 0   nitroGLYCERIN (NITROSTAT) 0.4 MG SL tablet Place 1 tablet (0.4 mg total) under the tongue every 5 (five) minutes as needed for chest pain. 25 tablet 1   pantoprazole  (PROTONIX ) 40 MG tablet TAKE ONE (1) TABLET BY MOUTH EVERY DAY (Patient taking differently: Take 40 mg by mouth at bedtime.) 90 tablet 1   eletriptan  (RELPAX ) 40 MG tablet TAKE 1 TABLET BY MOUTH  AT  ONSET  OF  HEADACHE.  MAY  REPEAT  IN  2  HOURS  IF  HEADACHE  PERSISITS  OR  RECURS (Patient taking differently: Take 40 mg by mouth every 2 (two) hours as needed for headache or migraine. TAKE 1 TABLET BY MOUTH  AT  ONSET  OF  HEADACHE.  MAY  REPEAT  IN  2  HOURS  IF  HEADACHE  PERSISITS  OR  RECURS) 18 tablet 0   No facility-administered medications prior to visit.    Physical Exam    11/26/2023   10:44 AM 11/12/2023    4:45 PM 11/12/2023    3:56 PM  Vitals with BMI  Height 5' 3    Weight 149 lbs    BMI 26.4    Systolic 90 114 132  Diastolic 64 76 52  Pulse 58 62 62   Gen: Alert, well appearing.  Patient is oriented to person, place, time, and situation. AFFECT: pleasant, lucid thought and speech. CV: RRR, no m/r/g.   LUNGS: CTA bilat, nonlabored resps, good aeration in all lung fields. EXT: no clubbing or cyanosis.  no edema.     Pertinent labs/imaging Last CBC Lab Results  Component Value Date   WBC 5.5 11/12/2023   HGB 13.0 11/12/2023   HCT 38.3 11/12/2023   MCV 89.7 11/12/2023   MCH 30.4 11/12/2023   RDW 12.3 11/12/2023   PLT 190 11/12/2023   Last metabolic panel Lab Results  Component Value Date   GLUCOSE 92 11/12/2023   NA 137 11/12/2023   K 4.3 11/12/2023   CL 105 11/12/2023   CO2 22 11/12/2023   BUN 6 (L) 11/12/2023   CREATININE 0.75 11/12/2023   GFRNONAA >60 11/12/2023    CALCIUM 9.2 11/12/2023   PHOS 3.5 11/12/2023   PROT 5.8 (L) 11/10/2023   ALBUMIN 3.4 (L) 11/12/2023   BILITOT 0.4 11/10/2023   ALKPHOS 59 11/10/2023   AST 36 11/10/2023   ALT 11 11/10/2023   ANIONGAP 10 11/12/2023   Last lipids Lab Results  Component Value Date   CHOL 162 11/11/2023   HDL 48 11/11/2023   LDLCALC 92 11/11/2023   TRIG 111 11/11/2023   CHOLHDL 3.4 11/11/2023   Last thyroid  functions Lab Results  Component Value Date   TSH 0.71 04/10/2022   Last vitamin D  Lab Results  Component Value Date   VD25OH 37 09/08/2011   Last vitamin B12 and Folate Lab Results  Component Value Date   VITAMINB12 1,292 (H) 09/08/2011   Lab Results  Component Value Date   DDIMER 0.54 (H) 11/09/2023   ASSESSMENT/PLAN:  #1 chest pain with elevated troponin. Coronary vasospasm suspected.  She has had a few occasions, all coming on in the setting of increased stress and anxiety. She only tolerates one half of a 2.5 mg amlodipine tab daily. She will continue this and as needed nitroglycerin.  She has follow-up with her cardiologist later this week.  #2 GAD, history of major depressive disorder. This had been pretty stable but there have been some family problems lately that she is having an adjustment reaction to. No changes in medications today--> citalopram  40 mg a day and alprazolam  1 mg twice daily as needed.  #3 chronic pain syndrome: scoliosis and multilevel spondylosis.  Myofascial pain syndrome. We will continue with Vicodin 5-3 25, 1 3 times daily as needed, number 90/month--> a new prescription was not needed today. Continue Robaxin  500 mg, 1-2 every 6 hours as needed. Controlled substance contract and urine drug screen are up-to-date.   Medical decision making of moderate complexity was utilized today.  FOLLOW UP: 3 months  Signed:  Gerlene Hockey, MD           11/26/2023

## 2023-11-30 ENCOUNTER — Ambulatory Visit: Attending: Student | Admitting: Student

## 2023-11-30 ENCOUNTER — Other Ambulatory Visit: Payer: Self-pay | Admitting: Student

## 2023-11-30 ENCOUNTER — Encounter: Payer: Self-pay | Admitting: Family Medicine

## 2023-11-30 ENCOUNTER — Other Ambulatory Visit: Payer: Self-pay | Admitting: Family Medicine

## 2023-11-30 ENCOUNTER — Encounter: Payer: Self-pay | Admitting: Student

## 2023-11-30 ENCOUNTER — Other Ambulatory Visit: Payer: Self-pay

## 2023-11-30 VITALS — BP 90/66 | HR 62 | Ht 64.0 in | Wt 148.0 lb

## 2023-11-30 DIAGNOSIS — R079 Chest pain, unspecified: Secondary | ICD-10-CM | POA: Diagnosis not present

## 2023-11-30 DIAGNOSIS — F419 Anxiety disorder, unspecified: Secondary | ICD-10-CM | POA: Diagnosis not present

## 2023-11-30 DIAGNOSIS — I252 Old myocardial infarction: Secondary | ICD-10-CM | POA: Diagnosis not present

## 2023-11-30 DIAGNOSIS — E785 Hyperlipidemia, unspecified: Secondary | ICD-10-CM | POA: Diagnosis not present

## 2023-11-30 MED ORDER — ELETRIPTAN HYDROBROMIDE 40 MG PO TABS: ORAL_TABLET | ORAL | 5 refills | Status: AC

## 2023-11-30 MED ORDER — ATORVASTATIN CALCIUM 80 MG PO TABS: 80.0000 mg | ORAL_TABLET | Freq: Every day | ORAL | 1 refills | Status: DC

## 2023-11-30 MED ORDER — AMLODIPINE BESYLATE 2.5 MG PO TABS: 1.2500 mg | ORAL_TABLET | Freq: Every day | ORAL | 3 refills | Status: DC

## 2023-11-30 NOTE — Patient Instructions (Signed)
 Medication Instructions:  Your physician recommends that you continue on your current medications as directed. Please refer to the Current Medication list given to you today.  *If you need a refill on your cardiac medications before your next appointment, please call your pharmacy*  Lab Work: Your physician recommends that you return for lab work in: 3 Months at [facility].   If you have labs (blood work) drawn today and your tests are completely normal, you will receive your results only by: MyChart Message (if you have MyChart) OR A paper copy in the mail If you have any lab test that is abnormal or we need to change your treatment, we will call you to review the results.  Testing/Procedures: NONE    Follow-Up: At [facility], you and your health needs are our priority.  As part of our continuing mission to provide you with exceptional heart care, our providers are all part of one team.  This team includes your primary Cardiologist (physician) and Advanced Practice Providers or APPs (Physician Assistants and Nurse Practitioners) who all work together to provide you with the care you need, when you need it.  Your next appointment:   4 -5 month(s)  Provider:   Laymon Qua, PA-C    We recommend signing up for the patient portal called MyChart.  Sign up information is provided on this After Visit Summary.  MyChart is used to connect with patients for Virtual Visits (Telemedicine).  Patients are able to view lab/test results, encounter notes, upcoming appointments, etc.  Non-urgent messages can be sent to your provider as well.   To learn more about what you can do with MyChart, go to [email or web address].   Other Instructions Thank you for choosing [facility]!

## 2023-11-30 NOTE — Progress Notes (Signed)
 Cardiology Office Note    Date:  11/30/2023  ID:  Jacqueline Vance, DOB [birth year], MRN [id number] Cardiologist: Evaluated by Dr. Sheena during admission but lives in [facility]  History of Present Illness:    Jacqueline Vance is a 63 y.o. female with past medical history of GERD, nephrolithiasis and RCC (s/p left nephrectomy in 2023) who presents to the office today for hospital follow-up.  She most recently presented to [facility] ED on 11/09/2023 for evaluation of left-sided chest pain which had started over an hour prior to arrival. She was found to have an NSTEMI with troponin values trending up to 454. Echocardiogram showed a preserved EF of 60 to 65% with normal RV function, a small pericardial effusion and trivial MR. She ultimately underwent a cardiac catheterization on 11/12/2023 which showed mild, nonobstructive disease with 20% ostial LAD stenosis and no angiographically significant disease elsewhere. Given no culprit lesion was identified, it was felt that symptoms could have been due to possible myopericarditis, coronary vasospasm or a noncardiac pathology. It was felt that her symptoms were likely due to coronary vasospasm as little reason to suspect myocarditis. Given her low BP and history of migraine headaches, it was recommended to trial low-dose Amlodipine 1.25 mg daily and she was started on Atorvastatin while being continued on ASA 81 mg daily.  In talking with the patient and her husband today, they report that she has had an occasional episode of chest pain since hospital discharge and she would take SL NTG with improvement  Her husband notes that these occurred when she was stressed and anxious. 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 She does report feeling stressed and anxious frequently and takes Celexa  daily but has been on this for over 30 years and takes Xanax  as needed.  She has not tried utilizing Xanax  when she has these episodes. She denies any dyspnea on exertion, orthopnea, PND or pitting edema. BP is soft at 90/66 during today's visit which is close to her known baseline and she denies any dizziness or presyncope.  Studies Reviewed:   EKG: EKG is not ordered today.  Echocardiogram: 10/2023 IMPRESSIONS     1. Left ventricular ejection fraction, by estimation, is 60 to 65%. The  left ventricle has normal function. The left ventricle has no regional  wall motion abnormalities. Left ventricular diastolic parameters were  normal.   2. Right ventricular systolic function is normal. The right ventricular  size is normal. There is normal pulmonary artery systolic pressure. The  estimated right ventricular systolic pressure is 26.0 mmHg.   3. A small pericardial effusion is present. The pericardial effusion is  circumferential.   4. The mitral valve is grossly normal. Trivial mitral valve  regurgitation. No evidence of mitral stenosis.   5. The aortic valve is grossly normal. Aortic valve regurgitation is not  visualized. No aortic stenosis is present.   6. The inferior vena cava is normal in size with greater than 50%  respiratory variability, suggesting right atrial pressure of 3 mmHg.   Cardiac Catheterization: 10/2023 Conclusions: Mild, non-obstructive coronary artery disease, with 20% ostial LAD stenosis.  Otherwise, no angiographically significant coronary artery disease. Normal left ventricular filling pressure (LVEDP 10 mmHg).   Recommendations: No culprit lesion identified for patient's chest pain and elevated troponin.  Given small pericardial effusion noted on echo, query myopericarditis.  Other considerations include coronary  vasospasm or non-cardiac pathology such as PE.  Further workup per rounding teams. Medical therapy and risk factor modification to prevent progression of coronary artery disease.    Physical Exam:   VS:  BP 90/66  (BP Location: Left Arm, Cuff Size: Normal)   Pulse 62   Ht 5' 4 (1.626 m)   Wt 148 lb (67.1 kg)   SpO2 99%   BMI 25.40 kg/m    Wt Readings from Last 3 Encounters:  11/30/23 148 lb (67.1 kg)  11/26/23 149 lb (67.6 kg)  11/12/23 147 lb (66.7 kg)     GEN: Well nourished, well developed female appearing in no acute distress NECK: No JVD; No carotid bruits CARDIAC: RRR, no murmurs, rubs, gallops RESPIRATORY:  Clear to auscultation without rales, wheezing or rhonchi  ABDOMEN: Appears non-distended. No obvious abdominal masses. EXTREMITIES: No clubbing or cyanosis. No pitting edema.  Distal pedal pulses are 2+ bilaterally. Radial cath site stable with only mild ecchymosis. No evidence of a hematoma.    Assessment and Plan:   1. Chest pain of uncertain etiology/ History of non-ST elevation myocardial infarction (NSTEMI) - Recent cardiac catheterization as outlined above showed only mild 20% ostial LAD stenosis and it was felt that symptoms could have been due to coronary vasospasm.  - Based off her description of symptoms since hospital discharge, they are likely due to her underlying anxiety. She does have Xanax  to take as needed and we reviewed that she could try taking one of her PRN doses or half of a tablet if she has an episode and if symptoms persist, then would utilize SL NTG. Would try to avoid frequent utilization of SL NTG given her soft BP and also history of migraine headaches. - Continue ASA 81 mg daily, Atorvastatin 80 mg daily and Amlodipine 1.25 mg daily.  2. Hyperlipidemia LDL goal <70 - LDL was at 92 during her recent admission and she was started on Atorvastatin 80 mg daily. Will recheck an FLP and LFTs in 2 to 3 months.  3. Anxiety - She has a longstanding history of anxiety and has been on Celexa  for over 30 years and takes Xanax  as needed. I encouraged her to follow-up with her PCP to see if medical therapy needs to be adjusted since she appears to now be having more  breakthrough episodes. Would also benefit from therapy or a caregiver support group given her significant stressors.  Signed, Laymon CHRISTELLA Qua, PA-C

## 2023-12-04 ENCOUNTER — Other Ambulatory Visit: Payer: Self-pay

## 2023-12-04 ENCOUNTER — Other Ambulatory Visit: Payer: Self-pay | Admitting: Family Medicine

## 2023-12-04 MED ORDER — ALBUTEROL SULFATE HFA 108 (90 BASE) MCG/ACT IN AERS: 1.0000 | INHALATION_SPRAY | RESPIRATORY_TRACT | 1 refills | Status: AC | PRN

## 2023-12-17 ENCOUNTER — Other Ambulatory Visit: Payer: Self-pay | Admitting: Family Medicine

## 2023-12-17 NOTE — Telephone Encounter (Signed)
 Requesting: Hydrocodone  Contract: 08/23/23 UDS: 08/23/23 Last Visit: 11/26/23 Next Visit: 02/25/24 Last Refill: 11/15/23 (90,0)  Please Advise. Rx pending

## 2024-01-14 ENCOUNTER — Ambulatory Visit: Admitting: Family Medicine

## 2024-01-15 ENCOUNTER — Encounter: Payer: Self-pay | Admitting: Family Medicine

## 2024-01-15 NOTE — Telephone Encounter (Signed)
 No further action needed at this time. Appt scheduled

## 2024-01-17 NOTE — Progress Notes (Unsigned)
 OFFICE VISIT  01/18/2024  CC:  Chief Complaint  Patient presents with   Arm Pain    Both arms states it has been going on since vaccines   Patient is a 64 y.o. female who presents for bilateral shoulder pain.  HPI:  Saw her 09/19/23, suspected right shoulder SIRVA after Prevnar injection done in August 2025.  I did a trial of therapeutic subacromial injection at 09/19/23 visit but she says it did not help any. In fact, her shoulder feels frozen now. Left shoulder began hurting.  She recalls that it only started hurting after her flu shot in early November 2025.  Milder symptoms in left shoulder, not particularly limiting any activities of daily living.  No pain with arm in neutral position.  Pain begins with abduction and forward flexion, significantly impaired range of motion. She has no neck pain or radiating arm pain or paresthesias. No distinct arm weakness.  She has no past history of shoulder problems, no past shoulder imaging.  Review of systems: No fever or abnormal weight loss.  No redness or warmth of the shoulders. She has chronic mild fatigue.  Past Medical History:  Diagnosis Date   Adult ADHD    Anxiety    Aortic atherosclerosis 08/2016   Atypical chest pain    Elevated troponins, no culprit lesion on cath, small pericardial effusion on echo-->? myocarditis.  ? coronary artery spasm?   CAD (coronary artery disease)    a. s/p NSTEMI in 10/2023 with cath showing only 20% ostial LAD stenosis and felt to be due to vasospasm   Chronic back pain    neck, too.  MRI L spine 05/2008: 40 degrees dextroscoliosis due to a hemivertebra of L3.  Some DDD/spondylosis w/out spinal stenosis.   Colon cancer screening    04/2021 Cologuard negative.   Complication of anesthesia    drop in BP, difficult to wake up, shallow breathing   Depression    Diastolic dysfunction, left ventricle 05/2018   Echo with grd I DD.   Dry eye syndrome    GERD (gastroesophageal reflux disease)    Gout     2 attacks total as of 12/2014.   Heart murmur    Kidney stones 2005; 2017; 2018   Most recent CT 08/2016 showed 3 mm nonobstructing L nephrolithiasis.   Migraine    Proph tx with topamax  helps, abortive tx with relpax  helps.  Couldn't afford topamax  so d/c'd this 04/2016.     Navicular fracture, foot 03/2018   Right Navicular fx-nonunion (surgery)   Pancreatic lesion    10/2021 6mm cystic lesion, stable 03/2022 and 04/2023-->no repeat is needed   Pulmonary nodule 2018   5 mm LLL, stable on 2 yr f/u imaging 2022-->benign, no further imaging needed.   Renal cancer, left (HCC)    partial nephrectomy 06/07/21   Restless legs syndrome    Pt can get to sleep fine so she declines med for this as of 08/2016    Past Surgical History:  Procedure Laterality Date   BREAST SURGERY  1999   reduction   ENDOMETRIAL ABLATION  2002   FOOT FRACTURE SURGERY  03/2018   Closed displaced fracture of right navicular, nonunion.   KIDNEY STONE SURGERY  2005   LAPAROSCOPIC VAGINAL HYSTERECTOMY  12/02/2007   for DUB   LEFT HEART CATH AND CORONARY ANGIOGRAPHY N/A 11/12/2023   20% ostial LAD stenosis, otherwise normal.  Procedure: LEFT HEART CATH AND CORONARY ANGIOGRAPHY;  Surgeon: Mady Bruckner, MD;  Location:  [facility];  Service: Cardiovascular;  Laterality: N/A;   LUMBAR ESI  2021   no help ([place])   REDUCTION MAMMAPLASTY     ROBOTIC ASSITED PARTIAL NEPHRECTOMY Left 06/07/2021   Procedure: XI ROBOTIC ASSITED PARTIAL NEPHRECTOMY;  Surgeon: Devere Lonni Righter, MD;  Location: [facility];  Service: Urology;  Laterality: Left;   TRANSTHORACIC ECHOCARDIOGRAM  06/13/2018   2020 Grd I DD.  10/2023 normal except small pericardial effusion   WISDOM TOOTH EXTRACTION      Outpatient Medications Prior to Visit  Medication Sig Dispense Refill   albuterol  (VENTOLIN  HFA) 108 (90 Base) MCG/ACT inhaler Inhale 1-2 puffs into the lungs every 4 (four) hours as needed for wheezing or shortness of breath. 8.5 g  1   ALPRAZolam  (XANAX ) 1 MG tablet TAKE ONE TABLET BY MOUTH TWICE A DAY AS NEEDED FOR ANXIETY (Patient taking differently: Take 1 mg by mouth 2 (two) times daily as needed for anxiety. TAKE ONE TABLET BY MOUTH TWICE A DAY AS NEEDED FOR ANXIETY) 180 tablet 1   aspirin  EC 81 MG tablet Take 1 tablet (81 mg total) by mouth daily. Swallow whole. 30 tablet 12   eletriptan  (RELPAX ) 40 MG tablet TAKE 1 TABLET BY MOUTH  AT  ONSET  OF  HEADACHE.  MAY  REPEAT  IN  2  HOURS  IF  HEADACHE  PERSISITS  OR  RECURS 18 tablet 5   HYDROcodone -acetaminophen  (NORCO/VICODIN) 5-325 MG tablet TAKE ONE TABLET BY MOUTH THREE TIMES DAILY AS NEEDED 90 tablet 0   nitroGLYCERIN  (NITROSTAT ) 0.4 MG SL tablet Place 1 tablet (0.4 mg total) under the tongue every 5 (five) minutes as needed for chest pain. 25 tablet 1   pantoprazole  (PROTONIX ) 40 MG tablet TAKE ONE (1) TABLET BY MOUTH EVERY DAY (Patient taking differently: Take 40 mg by mouth at bedtime.) 90 tablet 1   amLODipine  (NORVASC ) 2.5 MG tablet Take 0.5 tablets (1.25 mg total) by mouth daily. 45 tablet 3   atorvastatin  (LIPITOR) 80 MG tablet Take 1 tablet (80 mg total) by mouth daily. 90 tablet 1   citalopram  (CELEXA ) 40 MG tablet TAKE ONE (1) TABLET BY MOUTH EVERY DAY (Patient taking differently: Take 40 mg by mouth every evening.) 90 tablet 0   No facility-administered medications prior to visit.    Allergies[1]  Review of Systems  As per HPI  PE:    01/18/2024   11:06 AM 11/30/2023    2:33 PM 11/26/2023   10:44 AM  Vitals with BMI  Height  5' 4 5' 3  Weight 151 lbs 13 oz 148 lbs 149 lbs  BMI  25.39 26.4  Systolic 114 90 90  Diastolic 74 66 64  Pulse 58 62 58     Physical Exam  Gen: Alert, well appearing.  Patient is oriented to person, place, time, and situation. AFFECT: pleasant, lucid thought and speech. Neck: Nontender, full range of motion without pain. Right and left shoulders are without any tenderness to palpation.  No warmth or redness or  swelling. She gets significant pain at the beginning of abduction and is limited to about 75 degrees abduction on the right, 110 degrees on the left., positive drop arm on the right, negative on the left.  Crossover positive bilaterally,R>>L. Hawkins positive bilaterally, R>>L.  LABS:  Last metabolic panel Lab Results  Component Value Date   GLUCOSE 92 11/12/2023   NA 137 11/12/2023   K 4.3 11/12/2023   CL 105 11/12/2023   CO2  22 11/12/2023   BUN 6 (L) 11/12/2023   CREATININE 0.75 11/12/2023   GFRNONAA >60 11/12/2023   CALCIUM  9.2 11/12/2023   PHOS 3.5 11/12/2023   PROT 5.8 (L) 11/10/2023   ALBUMIN 3.4 (L) 11/12/2023   BILITOT 0.4 11/10/2023   ALKPHOS 59 11/10/2023   AST 36 11/10/2023   ALT 11 11/10/2023   ANIONGAP 10 11/12/2023   Lab Results  Component Value Date   TSH 0.71 04/10/2022   IMPRESSION AND PLAN:  Bilateral shoulder pain, right much worse than the left. Each shoulder started hurting not long after getting a deltoid injection with vaccine (right side 08/23/2023 Prevnar, left side 11/26/2023 flu), suggesting SIRVA (shoulder injury related to vaccine administration). Right shoulder subacromial steroid injection 09/19/2023 not helpful. Right shoulder has progressed to frozen shoulder, left side with mostly impingement signs at this time. Will get right shoulder MRI for further detailed information. Anticipate having her back for glenohumeral injection on the right, start PT at that time. Discussed the possibility of orthopedic referral at any time.    Will check TSH today since adhesive capsulitis can be the initial presentation of hypothyroidism.  An After Visit Summary was printed and given to the patient.  FOLLOW UP: Return for TBD based on shoulder MRI. Has appointment set for 02/25/2024 Signed:  Gerlene Hockey, MD           01/18/2024      [1]  Allergies Allergen Reactions   Imitrex  [Sumatriptan ] Other (See Comments)    Face and Arm numbness

## 2024-01-18 ENCOUNTER — Ambulatory Visit: Admitting: Family Medicine

## 2024-01-18 ENCOUNTER — Encounter: Payer: Self-pay | Admitting: Family Medicine

## 2024-01-18 VITALS — BP 114/74 | HR 58 | Temp 97.2°F | Wt 151.8 lb

## 2024-01-18 DIAGNOSIS — M7501 Adhesive capsulitis of right shoulder: Secondary | ICD-10-CM | POA: Diagnosis not present

## 2024-01-18 DIAGNOSIS — M25511 Pain in right shoulder: Secondary | ICD-10-CM | POA: Diagnosis not present

## 2024-01-18 DIAGNOSIS — S4980XA Other specified injuries of shoulder and upper arm, unspecified arm, initial encounter: Secondary | ICD-10-CM

## 2024-01-18 DIAGNOSIS — M25512 Pain in left shoulder: Secondary | ICD-10-CM

## 2024-01-18 DIAGNOSIS — G8929 Other chronic pain: Secondary | ICD-10-CM | POA: Diagnosis not present

## 2024-01-18 DIAGNOSIS — T50Z95A Adverse effect of other vaccines and biological substances, initial encounter: Secondary | ICD-10-CM

## 2024-01-18 LAB — TSH: TSH: 1.31 u[IU]/mL (ref 0.35–5.50)

## 2024-01-18 MED ORDER — AMLODIPINE BESYLATE 2.5 MG PO TABS: 2.5000 mg | ORAL_TABLET | Freq: Every day | ORAL | 3 refills | Status: AC

## 2024-01-18 MED ORDER — CITALOPRAM HYDROBROMIDE 40 MG PO TABS: 40.0000 mg | ORAL_TABLET | Freq: Every evening | ORAL | 3 refills | Status: AC

## 2024-01-18 MED ORDER — ATORVASTATIN CALCIUM 80 MG PO TABS: 80.0000 mg | ORAL_TABLET | Freq: Every day | ORAL | 3 refills | Status: AC

## 2024-01-19 ENCOUNTER — Ambulatory Visit: Payer: Self-pay | Admitting: Family Medicine

## 2024-01-19 DIAGNOSIS — S43431A Superior glenoid labrum lesion of right shoulder, initial encounter: Secondary | ICD-10-CM

## 2024-01-19 DIAGNOSIS — M7501 Adhesive capsulitis of right shoulder: Secondary | ICD-10-CM

## 2024-01-19 DIAGNOSIS — G8929 Other chronic pain: Secondary | ICD-10-CM

## 2024-01-25 ENCOUNTER — Ambulatory Visit (HOSPITAL_COMMUNITY)
Admission: RE | Admit: 2024-01-25 | Discharge: 2024-01-25 | Disposition: A | Source: Ambulatory Visit | Attending: Family Medicine | Admitting: Family Medicine

## 2024-01-25 DIAGNOSIS — G8929 Other chronic pain: Secondary | ICD-10-CM | POA: Insufficient documentation

## 2024-01-25 DIAGNOSIS — M7501 Adhesive capsulitis of right shoulder: Secondary | ICD-10-CM | POA: Insufficient documentation

## 2024-01-25 DIAGNOSIS — M25511 Pain in right shoulder: Secondary | ICD-10-CM | POA: Diagnosis present

## 2024-01-27 ENCOUNTER — Encounter: Payer: Self-pay | Admitting: Family Medicine

## 2024-01-29 ENCOUNTER — Other Ambulatory Visit: Payer: Self-pay | Admitting: Family Medicine

## 2024-01-29 NOTE — Telephone Encounter (Signed)
 FYI  Please see below

## 2024-02-11 ENCOUNTER — Other Ambulatory Visit: Payer: Self-pay | Admitting: Family Medicine

## 2024-02-12 NOTE — Telephone Encounter (Signed)
 Requesting: Hydrocodone  Contract: 08/23/23 UDS: 08/23/23 Last Visit: 01/18/24 Next Visit: 02/25/24 Last Refill: 12/17/23 (90,0)  Please Advise.SABRA Rx pending

## 2024-02-13 ENCOUNTER — Other Ambulatory Visit: Payer: Self-pay | Admitting: Family Medicine

## 2024-02-25 ENCOUNTER — Ambulatory Visit: Admitting: Family Medicine

## 2024-04-04 ENCOUNTER — Encounter: Admitting: Family Medicine

## 2024-04-16 ENCOUNTER — Encounter

## 2024-05-15 ENCOUNTER — Ambulatory Visit: Admitting: Student
